# Patient Record
Sex: Male | Born: 1945
Health system: Southern US, Community
[De-identification: ages and names within clinical notes are randomized; demographics above are authoritative.]

## PROBLEM LIST (undated history)

## (undated) DIAGNOSIS — L509 Urticaria, unspecified: Secondary | ICD-10-CM

## (undated) DIAGNOSIS — K602 Anal fissure, unspecified: Secondary | ICD-10-CM

## (undated) DIAGNOSIS — M1990 Unspecified osteoarthritis, unspecified site: Secondary | ICD-10-CM

## (undated) DIAGNOSIS — K5730 Diverticulosis of large intestine without perforation or abscess without bleeding: Secondary | ICD-10-CM

## (undated) DIAGNOSIS — D649 Anemia, unspecified: Secondary | ICD-10-CM

## (undated) DIAGNOSIS — H269 Unspecified cataract: Secondary | ICD-10-CM

## (undated) DIAGNOSIS — R7309 Other abnormal glucose: Secondary | ICD-10-CM

## (undated) DIAGNOSIS — N189 Chronic kidney disease, unspecified: Secondary | ICD-10-CM

## (undated) DIAGNOSIS — I10 Essential (primary) hypertension: Secondary | ICD-10-CM

## (undated) DIAGNOSIS — E785 Hyperlipidemia, unspecified: Secondary | ICD-10-CM

## (undated) DIAGNOSIS — K219 Gastro-esophageal reflux disease without esophagitis: Secondary | ICD-10-CM

## (undated) DIAGNOSIS — Z860100 Personal history of colon polyps, unspecified: Secondary | ICD-10-CM

## (undated) DIAGNOSIS — M109 Gout, unspecified: Secondary | ICD-10-CM

## (undated) DIAGNOSIS — T7840XA Allergy, unspecified, initial encounter: Secondary | ICD-10-CM

## (undated) DIAGNOSIS — B029 Zoster without complications: Secondary | ICD-10-CM

## (undated) DIAGNOSIS — N289 Disorder of kidney and ureter, unspecified: Secondary | ICD-10-CM

## (undated) HISTORY — PX: LIPOMA EXCISION: SHX5283

## (undated) HISTORY — DX: Urticaria, unspecified: L50.9

## (undated) HISTORY — DX: Unspecified cataract: H26.9

## (undated) HISTORY — DX: Gout, unspecified: M10.9

## (undated) HISTORY — PX: TONSILLECTOMY: SHX5217

## (undated) HISTORY — DX: Hyperlipidemia, unspecified: E78.5

## (undated) HISTORY — DX: Allergy, unspecified, initial encounter: T78.40XA

## (undated) HISTORY — DX: Zoster without complications: B02.9

## (undated) HISTORY — PX: HEMORRHOID SURGERY: SHX153

## (undated) HISTORY — DX: Anal fissure, unspecified: K60.2

## (undated) HISTORY — PX: OTHER SURGICAL HISTORY: SHX169

## (undated) HISTORY — DX: Chronic kidney disease, unspecified: N18.9

## (undated) HISTORY — PX: POLYPECTOMY: SHX149

## (undated) HISTORY — DX: Gastro-esophageal reflux disease without esophagitis: K21.9

## (undated) HISTORY — DX: Disorder of kidney and ureter, unspecified: N28.9

## (undated) HISTORY — DX: Diverticulosis of large intestine without perforation or abscess without bleeding: K57.30

## (undated) HISTORY — DX: Unspecified osteoarthritis, unspecified site: M19.90

## (undated) HISTORY — DX: Personal history of colon polyps: Z86.010

## (undated) HISTORY — DX: Personal history of colon polyps, unspecified: Z86.0100

## (undated) HISTORY — PX: CATARACT EXTRACTION: SUR2

## (undated) HISTORY — DX: Essential (primary) hypertension: I10

## (undated) HISTORY — PX: COLONOSCOPY: SHX174

## (undated) HISTORY — DX: Anemia, unspecified: D64.9

## (undated) HISTORY — PX: TONSILLECTOMY: SUR1361

## (undated) HISTORY — DX: Other abnormal glucose: R73.09

---

## 1999-03-14 ENCOUNTER — Other Ambulatory Visit: Admission: RE | Admit: 1999-03-14 | Discharge: 1999-03-14 | Payer: Self-pay | Admitting: Internal Medicine

## 1999-03-14 ENCOUNTER — Encounter (INDEPENDENT_AMBULATORY_CARE_PROVIDER_SITE_OTHER): Payer: Self-pay

## 2004-06-24 ENCOUNTER — Ambulatory Visit: Payer: Self-pay | Admitting: Internal Medicine

## 2004-07-01 ENCOUNTER — Ambulatory Visit: Payer: Self-pay | Admitting: Internal Medicine

## 2004-07-11 ENCOUNTER — Ambulatory Visit: Payer: Self-pay | Admitting: Internal Medicine

## 2004-07-15 ENCOUNTER — Ambulatory Visit: Payer: Self-pay

## 2004-07-20 ENCOUNTER — Ambulatory Visit: Payer: Self-pay | Admitting: Internal Medicine

## 2005-02-09 ENCOUNTER — Ambulatory Visit: Payer: Self-pay | Admitting: Endocrinology

## 2005-02-13 ENCOUNTER — Ambulatory Visit: Payer: Self-pay | Admitting: Endocrinology

## 2005-06-29 ENCOUNTER — Ambulatory Visit: Payer: Self-pay | Admitting: Internal Medicine

## 2005-07-06 ENCOUNTER — Ambulatory Visit: Payer: Self-pay | Admitting: Internal Medicine

## 2005-11-28 ENCOUNTER — Ambulatory Visit: Payer: Self-pay | Admitting: Internal Medicine

## 2006-04-18 ENCOUNTER — Ambulatory Visit: Payer: Self-pay | Admitting: Internal Medicine

## 2006-07-27 ENCOUNTER — Ambulatory Visit: Payer: Self-pay | Admitting: Internal Medicine

## 2006-07-27 LAB — CONVERTED CEMR LAB
AST: 43 units/L — ABNORMAL HIGH (ref 0–37)
Albumin: 4.1 g/dL (ref 3.5–5.2)
Alkaline Phosphatase: 37 units/L — ABNORMAL LOW (ref 39–117)
Basophils Absolute: 0 10*3/uL (ref 0.0–0.1)
Cholesterol: 278 mg/dL (ref 0–200)
Direct LDL: 140.3 mg/dL
Eosinophils Absolute: 0.1 10*3/uL (ref 0.0–0.6)
Eosinophils Relative: 1.8 % (ref 0.0–5.0)
GFR calc Af Amer: 79 mL/min
HDL: 37.9 mg/dL — ABNORMAL LOW (ref >39.0)
Leukocytes, UA: NEGATIVE
MCHC: 34.4 g/dL (ref 30.0–36.0)
MCV: 90.2 fL (ref 78.0–100.0)
Monocytes Absolute: 0.8 10*3/uL — ABNORMAL HIGH (ref 0.2–0.7)
Monocytes Relative: 9.2 % (ref 3.0–11.0)
Neutrophils Relative %: 56.1 % (ref 43.0–77.0)
Nitrite: NEGATIVE
PSA: 0.6 ng/mL (ref 0.10–4.00)
Potassium: 4.6 meq/L (ref 3.5–5.1)
RDW: 12.9 % (ref 11.5–14.6)
Total Bilirubin: 0.5 mg/dL (ref 0.3–1.2)
Total CHOL/HDL Ratio: 7.3
Total Protein, Urine: NEGATIVE mg/dL
Triglycerides: 340 mg/dL (ref 0–149)
VLDL: 68 mg/dL — ABNORMAL HIGH (ref 0–40)

## 2006-08-03 ENCOUNTER — Ambulatory Visit: Payer: Self-pay | Admitting: Internal Medicine

## 2006-10-25 ENCOUNTER — Ambulatory Visit: Payer: Self-pay | Admitting: Internal Medicine

## 2006-10-25 LAB — CONVERTED CEMR LAB
Albumin: 4.1 g/dL (ref 3.5–5.2)
Alkaline Phosphatase: 44 units/L (ref 39–117)
BUN: 15 mg/dL (ref 6–23)
Bilirubin, Direct: 0.2 mg/dL (ref 0.0–0.3)
CO2: 29 meq/L (ref 19–32)
Calcium: 9.6 mg/dL (ref 8.4–10.5)
GFR calc non Af Amer: 60 mL/min
Glucose, Bld: 115 mg/dL — ABNORMAL HIGH (ref 70–99)
LDL Cholesterol: 79 mg/dL (ref 0–99)
Total Bilirubin: 1.2 mg/dL (ref 0.3–1.2)
Total CHOL/HDL Ratio: 4.5

## 2006-11-01 ENCOUNTER — Ambulatory Visit: Payer: Self-pay | Admitting: Internal Medicine

## 2007-03-22 ENCOUNTER — Ambulatory Visit: Payer: Self-pay | Admitting: Internal Medicine

## 2007-03-24 LAB — CONVERTED CEMR LAB
AST: 30 units/L (ref 0–37)
Albumin: 4.2 g/dL (ref 3.5–5.2)
BUN: 23 mg/dL (ref 6–23)
CO2: 29 meq/L (ref 19–32)
Calcium: 9.6 mg/dL (ref 8.4–10.5)
Creatinine, Ser: 1.3 mg/dL (ref 0.4–1.5)
GFR calc non Af Amer: 60 mL/min
Glucose, Bld: 124 mg/dL — ABNORMAL HIGH (ref 70–99)
Hgb A1c MFr Bld: 6.7 % — ABNORMAL HIGH (ref 4.6–6.0)
LDL Cholesterol: 98 mg/dL (ref 0–99)
Potassium: 4.8 meq/L (ref 3.5–5.1)
Sodium: 141 meq/L (ref 135–145)

## 2007-03-29 ENCOUNTER — Ambulatory Visit: Payer: Self-pay | Admitting: Internal Medicine

## 2007-03-29 DIAGNOSIS — J309 Allergic rhinitis, unspecified: Secondary | ICD-10-CM | POA: Insufficient documentation

## 2007-03-29 DIAGNOSIS — E785 Hyperlipidemia, unspecified: Secondary | ICD-10-CM | POA: Insufficient documentation

## 2007-03-29 DIAGNOSIS — M109 Gout, unspecified: Secondary | ICD-10-CM | POA: Insufficient documentation

## 2007-03-29 DIAGNOSIS — I10 Essential (primary) hypertension: Secondary | ICD-10-CM

## 2007-03-29 DIAGNOSIS — Z860100 Personal history of colon polyps, unspecified: Secondary | ICD-10-CM | POA: Insufficient documentation

## 2007-03-29 DIAGNOSIS — R7309 Other abnormal glucose: Secondary | ICD-10-CM | POA: Insufficient documentation

## 2007-03-30 DIAGNOSIS — M1990 Unspecified osteoarthritis, unspecified site: Secondary | ICD-10-CM | POA: Insufficient documentation

## 2007-04-13 ENCOUNTER — Emergency Department (HOSPITAL_COMMUNITY): Admission: EM | Admit: 2007-04-13 | Discharge: 2007-04-13 | Payer: Self-pay | Admitting: Emergency Medicine

## 2007-07-12 ENCOUNTER — Encounter: Payer: Self-pay | Admitting: *Deleted

## 2007-07-12 DIAGNOSIS — K5730 Diverticulosis of large intestine without perforation or abscess without bleeding: Secondary | ICD-10-CM | POA: Insufficient documentation

## 2007-07-12 DIAGNOSIS — Z9089 Acquired absence of other organs: Secondary | ICD-10-CM

## 2007-07-12 DIAGNOSIS — Z872 Personal history of diseases of the skin and subcutaneous tissue: Secondary | ICD-10-CM | POA: Insufficient documentation

## 2007-07-12 DIAGNOSIS — Z9889 Other specified postprocedural states: Secondary | ICD-10-CM | POA: Insufficient documentation

## 2007-07-12 DIAGNOSIS — B029 Zoster without complications: Secondary | ICD-10-CM | POA: Insufficient documentation

## 2007-08-23 ENCOUNTER — Ambulatory Visit: Payer: Self-pay | Admitting: Internal Medicine

## 2007-08-23 LAB — CONVERTED CEMR LAB
ALT: 35 units/L (ref 0–53)
AST: 40 units/L — ABNORMAL HIGH (ref 0–37)
Albumin: 4.4 g/dL (ref 3.5–5.2)
Alkaline Phosphatase: 53 units/L (ref 39–117)
Basophils Absolute: 0.1 10*3/uL (ref 0.0–0.1)
Bilirubin Urine: NEGATIVE
Bilirubin, Direct: 0.2 mg/dL (ref 0.0–0.3)
CO2: 26 meq/L (ref 19–32)
Chloride: 104 meq/L (ref 96–112)
Creatinine, Ser: 1.4 mg/dL (ref 0.4–1.5)
Eosinophils Absolute: 0.1 10*3/uL (ref 0.0–0.7)
Glucose, Bld: 112 mg/dL — ABNORMAL HIGH (ref 70–99)
Hemoglobin: 14.7 g/dL (ref 13.0–17.0)
Leukocytes, UA: NEGATIVE
Lymphocytes Relative: 33.5 % (ref 12.0–46.0)
MCV: 93.1 fL (ref 78.0–100.0)
Neutro Abs: 3.7 10*3/uL (ref 1.4–7.7)
PSA: 0.68 ng/mL (ref 0.10–4.00)
Platelets: 156 10*3/uL (ref 150–400)
Potassium: 4.2 meq/L (ref 3.5–5.1)
Sodium: 139 meq/L (ref 135–145)
TSH: 1.68 microintl units/mL (ref 0.35–5.50)
Total Protein: 7.5 g/dL (ref 6.0–8.3)
Urine Glucose: NEGATIVE mg/dL
Urobilinogen, UA: 0.2 (ref 0.0–1.0)

## 2007-08-29 ENCOUNTER — Ambulatory Visit: Payer: Self-pay | Admitting: Internal Medicine

## 2007-09-10 ENCOUNTER — Telehealth: Payer: Self-pay | Admitting: Internal Medicine

## 2007-12-26 ENCOUNTER — Telehealth: Payer: Self-pay | Admitting: Internal Medicine

## 2008-01-09 ENCOUNTER — Ambulatory Visit: Payer: Self-pay | Admitting: Internal Medicine

## 2008-01-09 DIAGNOSIS — M25579 Pain in unspecified ankle and joints of unspecified foot: Secondary | ICD-10-CM | POA: Insufficient documentation

## 2008-01-09 DIAGNOSIS — M25529 Pain in unspecified elbow: Secondary | ICD-10-CM

## 2008-01-14 ENCOUNTER — Telehealth: Payer: Self-pay | Admitting: Internal Medicine

## 2008-03-26 ENCOUNTER — Ambulatory Visit: Payer: Self-pay | Admitting: Internal Medicine

## 2008-03-26 LAB — CONVERTED CEMR LAB
Albumin: 4.1 g/dL (ref 3.5–5.2)
Alkaline Phosphatase: 42 units/L (ref 39–117)
Calcium: 9.9 mg/dL (ref 8.4–10.5)
Chloride: 103 meq/L (ref 96–112)
Cholesterol: 189 mg/dL (ref 0–200)
GFR calc Af Amer: 66 mL/min
Glucose, Bld: 139 mg/dL — ABNORMAL HIGH (ref 70–99)
HDL: 39 mg/dL (ref >39.0)
TSH: 1.69 microintl units/mL (ref 0.35–5.50)
Total Bilirubin: 1 mg/dL (ref 0.3–1.2)
Total Protein: 7.4 g/dL (ref 6.0–8.3)

## 2008-04-01 ENCOUNTER — Ambulatory Visit: Payer: Self-pay | Admitting: Internal Medicine

## 2008-04-01 DIAGNOSIS — M779 Enthesopathy, unspecified: Secondary | ICD-10-CM | POA: Insufficient documentation

## 2008-04-01 LAB — CONVERTED CEMR LAB: Sed Rate: 18 mm/hr — ABNORMAL HIGH (ref 0–16)

## 2008-04-30 ENCOUNTER — Ambulatory Visit: Payer: Self-pay | Admitting: Internal Medicine

## 2008-08-20 ENCOUNTER — Ambulatory Visit: Payer: Self-pay | Admitting: Internal Medicine

## 2008-09-03 ENCOUNTER — Ambulatory Visit: Payer: Self-pay | Admitting: Internal Medicine

## 2008-09-03 ENCOUNTER — Encounter: Payer: Self-pay | Admitting: Internal Medicine

## 2008-09-05 ENCOUNTER — Encounter: Payer: Self-pay | Admitting: Internal Medicine

## 2008-11-20 ENCOUNTER — Ambulatory Visit: Payer: Self-pay | Admitting: Internal Medicine

## 2008-11-20 LAB — CONVERTED CEMR LAB
AST: 34 units/L (ref 0–37)
Albumin: 4.2 g/dL (ref 3.5–5.2)
Alkaline Phosphatase: 40 units/L (ref 39–117)
BUN: 17 mg/dL (ref 6–23)
Bilirubin, Direct: 0.1 mg/dL (ref 0.0–0.3)
Chloride: 101 meq/L (ref 96–112)
Cholesterol: 201 mg/dL — ABNORMAL HIGH (ref 0–200)
Creatinine, Ser: 1.4 mg/dL (ref 0.4–1.5)
Total CHOL/HDL Ratio: 5

## 2008-11-27 ENCOUNTER — Ambulatory Visit: Payer: Self-pay | Admitting: Internal Medicine

## 2008-11-27 DIAGNOSIS — M7020 Olecranon bursitis, unspecified elbow: Secondary | ICD-10-CM

## 2009-03-03 ENCOUNTER — Telehealth: Payer: Self-pay | Admitting: Internal Medicine

## 2009-03-29 ENCOUNTER — Telehealth: Payer: Self-pay | Admitting: Internal Medicine

## 2009-03-30 ENCOUNTER — Ambulatory Visit: Payer: Self-pay | Admitting: Internal Medicine

## 2009-03-30 DIAGNOSIS — J019 Acute sinusitis, unspecified: Secondary | ICD-10-CM | POA: Insufficient documentation

## 2009-07-15 ENCOUNTER — Ambulatory Visit: Payer: Self-pay | Admitting: Internal Medicine

## 2009-07-16 LAB — CONVERTED CEMR LAB
ALT: 31 units/L (ref 0–53)
Alkaline Phosphatase: 38 units/L — ABNORMAL LOW (ref 39–117)
Basophils Relative: 0.4 % (ref 0.0–3.0)
Bilirubin Urine: NEGATIVE
Bilirubin, Direct: 0.1 mg/dL (ref 0.0–0.3)
CO2: 24 meq/L (ref 19–32)
Calcium: 9.4 mg/dL (ref 8.4–10.5)
Chloride: 105 meq/L (ref 96–112)
Direct LDL: 96.8 mg/dL
Eosinophils Relative: 2.8 % (ref 0.0–5.0)
Glucose, Bld: 186 mg/dL — ABNORMAL HIGH (ref 70–99)
HDL: 38 mg/dL — ABNORMAL LOW (ref >39.00)
Hemoglobin, Urine: NEGATIVE
Lymphocytes Relative: 35.9 % (ref 12.0–46.0)
Monocytes Relative: 8.6 % (ref 3.0–12.0)
Neutrophils Relative %: 52.3 % (ref 43.0–77.0)
Nitrite: NEGATIVE
PSA: 0.71 ng/mL (ref 0.10–4.00)
Platelets: 145 10*3/uL — ABNORMAL LOW (ref 150.0–400.0)
Potassium: 4.5 meq/L (ref 3.5–5.1)
RBC: 4.2 M/uL — ABNORMAL LOW (ref 4.22–5.81)
Sodium: 137 meq/L (ref 135–145)
Total Bilirubin: 0.8 mg/dL (ref 0.3–1.2)
Total CHOL/HDL Ratio: 5
Total Protein, Urine: NEGATIVE mg/dL
Total Protein: 7 g/dL (ref 6.0–8.3)
Urine Glucose: NEGATIVE mg/dL
VLDL: 57.6 mg/dL — ABNORMAL HIGH (ref 0.0–40.0)
WBC: 6.4 10*3/uL (ref 4.5–10.5)
pH: 5.5 (ref 5.0–8.0)

## 2009-07-23 ENCOUNTER — Ambulatory Visit: Payer: Self-pay | Admitting: Internal Medicine

## 2009-07-23 DIAGNOSIS — E119 Type 2 diabetes mellitus without complications: Secondary | ICD-10-CM | POA: Insufficient documentation

## 2009-11-26 ENCOUNTER — Ambulatory Visit: Payer: Self-pay | Admitting: Internal Medicine

## 2009-11-28 LAB — CONVERTED CEMR LAB
CO2: 26 meq/L (ref 19–32)
Calcium: 9.6 mg/dL (ref 8.4–10.5)
GFR calc non Af Amer: 47.49 mL/min (ref >60)
Glucose, Bld: 137 mg/dL — ABNORMAL HIGH (ref 70–99)
HDL: 40.4 mg/dL (ref >39.00)
Potassium: 5.2 meq/L — ABNORMAL HIGH (ref 3.5–5.1)
Sodium: 139 meq/L (ref 135–145)
Testosterone: 332.05 ng/dL — ABNORMAL LOW (ref 350.00–890.00)
Triglycerides: 259 mg/dL — ABNORMAL HIGH (ref 0.0–149.0)
VLDL: 51.8 mg/dL — ABNORMAL HIGH (ref 0.0–40.0)

## 2009-12-02 ENCOUNTER — Ambulatory Visit: Payer: Self-pay | Admitting: Internal Medicine

## 2010-03-10 NOTE — Assessment & Plan Note (Signed)
Summary: f/u   Vital Signs:  Patient profile:   65 year old male Height:      69 inches Weight:      209 pounds BMI:     30.98 Temp:     97.8 degrees F oral Pulse rate:   72 / minute Pulse rhythm:   regular Resp:     16 per minute BP sitting:   140 / 84  (left arm) Cuff size:   large  Vitals Entered By: Lanier Prude, CMA(AAMA) (December 02, 2009 8:40 AM) CC: f/u  Is Patient Diabetic? Yes Comments pt states Metformin has been causing gout flare ups   Primary Care Provider:  Tresa Garter MD  CC:  f/u .  History of Present Illness: The patient presents for a follow up of hypertension, diabetes, hyperlipidemia  He had gout attacks after he started metformin  Current Medications (verified): 1)  Aspirin 325 Mg Tabs (Aspirin) .... Take 1 Tab By Mouth Every Day 2)  Flonase 50 Mcg/act  Susp (Fluticasone Propionate) .Marland Kitchen.. 1-2 Spr Once Daily Prn 3)  Vitamin D3 2000 Unit Caps (Cholecalciferol) .... Once Daily 4)  Glucosamine-Chondroitin 750-600 Mg  Tabs (Glucosamine-Chondroitin) .Marland Kitchen.. 1 By Mouth Bid 5)  Hemril-30 30 Mg Supp (Hydrocortisone Acetate) .Marland Kitchen.. 1 Per Rectum Two Times A Day X 10 Days 6)  Meloxicam 15 Mg Tabs (Meloxicam) .... One By Mouth Daily 7)  Mavik 4 Mg Tabs (Trandolapril) .Marland Kitchen.. 1 By Mouth Qd 8)  Allopurinol 100 Mg Tabs (Allopurinol) .Marland Kitchen.. 1 By Mouth Daily 9)  Pennsaid 1.5 % Soln (Diclofenac Sodium) .... 3-5 Gtt To Skin Three Times Daily 10)  Proctosol Hc 2.5 % Crea (Hydrocortisone) .... Use Two Times A Day Prn 11)  Metformin Hcl 500 Mg Tabs (Metformin Hcl) .Marland Kitchen.. 1 By Mouth Two Times A Day For Sugar  Allergies (verified): 1)  ! Sulfa 2)  Lipitor 3)  Metformin Hcl  Past History:  Past Medical History: Last updated: 07/23/2009 Hx of HERPES ZOSTER (ICD-053.9) ANAL FISSURE, HX OF (ICD-V13.3) Hx of DIVERTICULOSIS, COLON (ICD-562.10) OSTEOARTHRITIS (ICD-715.90) ABNORMAL GLUCOSE NEC (ICD-790.29) ALLERGIC RHINITIS (ICD-477.9) HYPERTENSION  (ICD-401.9) HYPERLIPIDEMIA (ICD-272.4) GOUT (ICD-274.9) COLONIC POLYPS, HX OF (ICD-V12.72) ESSENTIAL HYPERTENSION, BENIGN (ICD-401.1)  Diabetes mellitus, type II 2011  Social History: Last updated: 03/30/2009 Married Never Smoked Alcohol use-no Drug use-no Regular exercise-no  Review of Systems  The patient denies weight loss, chest pain, and abdominal pain.    Physical Exam  General:  alert, well-developed, well-nourished, well-hydrated, appropriate dress, normal appearance, healthy-appearing, cooperative to examination, and good hygiene.   Ears:  R ear normal and L ear normal.   Mouth:  Oral mucosa and oropharynx without lesions or exudates.  Teeth in good repair. Neck:  supple, full ROM, no masses, no thyromegaly, no JVD, normal carotid upstroke, no carotid bruits, no cervical lymphadenopathy, and no neck tenderness.   Lungs:  Normal respiratory effort, chest expands symmetrically. Lungs are clear to auscultation, no crackles or wheezes. Heart:  Normal rate and regular rhythm. S1 and S2 normal without gallop, murmur, click, rub or other extra sounds. Abdomen:  Bowel sounds positive,abdomen soft and non-tender without masses, organomegaly or hernias noted. Msk:  No deformity or scoliosis noted of thoracic or lumbar spine.   Extremities:  No clubbing, cyanosis, edema, or deformity noted with normal full range of motion of all joints.   Neurologic:  No cranial nerve deficits noted. Station and gait are normal. Plantar reflexes are down-going bilaterally. DTRs are symmetrical throughout. Sensory, motor and coordinative  functions appear intact. Skin:  Intact without suspicious lesions or rashes Psych:  Cognition and judgment appear intact. Alert and cooperative with normal attention span and concentration. No apparent delusions, illusions, hallucinations   Impression & Recommendations:  Problem # 1:  DIABETES MELLITUS, TYPE II (ICD-250.00) Assessment Improved  The following  medications were removed from the medication list:    Metformin Hcl 500 Mg Tabs (Metformin hcl) .Marland Kitchen... 1 by mouth two times a day for sugar His updated medication list for this problem includes:    Aspirin 325 Mg Tabs (Aspirin) .Marland Kitchen... Take 1 tab by mouth every day    Mavik 4 Mg Tabs (Trandolapril) .Marland Kitchen... 1 by mouth qd    Kombiglyze Xr 06-998 Mg Xr24h-tab (Saxagliptin-metformin) .Marland Kitchen... 1 by mouth qd  Labs Reviewed: Creat: 1.6 (11/26/2009)    Reviewed HgBA1c results: 7.8 (11/26/2009)  8.8 (07/15/2009)  Problem # 2:  PAIN IN JOINT, ANKLE AND FOOT (ICD-719.47) Assessment: Deteriorated Poss gout  Problem # 3:  HYPERTENSION (ICD-401.9) Assessment: Unchanged  His updated medication list for this problem includes:    Mavik 4 Mg Tabs (Trandolapril) .Marland Kitchen... 1 by mouth qd  Problem # 4:  ANAL FISSURE, HX OF (ICD-V13.3) Assessment: Improved  Problem # 5:  HYPERLIPIDEMIA (ICD-272.4) Assessment: Comment Only  His updated medication list for this problem includes:    Crestor 10 Mg Tabs (Rosuvastatin calcium) .Marland Kitchen... 1 by mouth once daily for cholesterol  Labs Reviewed: SGOT: 28 (07/15/2009)   SGPT: 31 (07/15/2009)   HDL:40.40 (11/26/2009), 38.00 (07/15/2009)  LDL:104 (03/26/2008), 83 (14/78/2956)  Chol:195 (11/26/2009), 193 (07/15/2009)  Trig:259.0 (11/26/2009), 288.0 (07/15/2009)  Complete Medication List: 1)  Aspirin 325 Mg Tabs (Aspirin) .... Take 1 tab by mouth every day 2)  Flonase 50 Mcg/act Susp (Fluticasone propionate) .Marland Kitchen.. 1-2 spr once daily prn 3)  Vitamin D3 2000 Unit Caps (Cholecalciferol) .... Once daily 4)  Glucosamine-chondroitin 750-600 Mg Tabs (Glucosamine-chondroitin) .Marland Kitchen.. 1 by mouth bid 5)  Hemril-30 30 Mg Supp (Hydrocortisone acetate) .Marland Kitchen.. 1 per rectum two times a day x 10 days 6)  Meloxicam 15 Mg Tabs (Meloxicam) .... One by mouth daily 7)  Mavik 4 Mg Tabs (Trandolapril) .Marland Kitchen.. 1 by mouth qd 8)  Allopurinol 100 Mg Tabs (Allopurinol) .Marland Kitchen.. 1 by mouth daily 9)  Pennsaid 1.5 % Soln  (Diclofenac sodium) .... 3-5 gtt to skin three times daily 10)  Proctosol Hc 2.5 % Crea (Hydrocortisone) .... Use two times a day prn 11)  Kombiglyze Xr 06-998 Mg Xr24h-tab (Saxagliptin-metformin) .Marland Kitchen.. 1 by mouth qd 12)  Crestor 10 Mg Tabs (Rosuvastatin calcium) .Marland Kitchen.. 1 by mouth once daily for cholesterol  Patient Instructions: 1)  Please schedule a follow-up appointment in 4 months. 2)  BMP prior to visit, ICD-9: 3)  HbgA1C prior to visit, ICD-9: 4)  Urine Microalbumin prior to visit, ICD-9: 5)  uric acid  250.00 995.20 Prescriptions: CRESTOR 10 MG TABS (ROSUVASTATIN CALCIUM) 1 by mouth once daily for cholesterol  #90 x 3   Entered and Authorized by:   Tresa Garter MD   Signed by:   Tresa Garter MD on 12/02/2009   Method used:   Print then Give to Patient   RxID:   [id number] MELOXICAM 15 MG TABS (MELOXICAM) one by mouth daily  #90 x 3   Entered and Authorized by:   Tresa Garter MD   Signed by:   Tresa Garter MD on 12/02/2009   Method used:   Print then Give to Patient   RxID:  [id number] MAVIK 4 MG TABS (TRANDOLAPRIL) 1 by mouth qd  #90 x 3   Entered and Authorized by:   Tresa Garter MD   Signed by:   Tresa Garter MD on 12/02/2009   Method used:   Print then Give to Patient   RxID:   [id number] ALLOPURINOL 100 MG TABS (ALLOPURINOL) 1 by mouth daily  #90 x 3   Entered and Authorized by:   Tresa Garter MD   Signed by:   Tresa Garter MD on 12/02/2009   Method used:   Print then Give to Patient   RxID:   [id number] KOMBIGLYZE XR 06-998 MG XR24H-TAB (SAXAGLIPTIN-METFORMIN) 1 by mouth qd  #90 x 3   Entered and Authorized by:   Tresa Garter MD   Signed by:   Tresa Garter MD on 12/02/2009   Method used:   Print then Give to Patient   RxID:   [id number] KOMBIGLYZE XR 06-998 MG XR24H-TAB (SAXAGLIPTIN-METFORMIN) 1 by mouth qd  #30 x 11   Entered and Authorized by:   Tresa Garter  MD   Signed by:   Tresa Garter MD on 12/02/2009   Method used:   Print then Give to Patient   RxID:   [id number]    Orders Added: 1)  Est. Patient Level IV [51884]

## 2010-03-10 NOTE — Progress Notes (Signed)
Summary: Crestor correction  Phone Note Call from Patient Call back at Home Phone [phone]   Summary of Call: Patient left message on triage stating the he needs his Crestor prescription corrected. Per the patient he needs it for 10mg  90 day suppy with year of refills. ?Ok to correct? Initial call taken by: Lucious Groves,  March 03, 2009 10:36 AM  Follow-up for Phone Call        OK to correct Follow-up by: Tresa Garter MD,  March 03, 2009 1:14 PM  Additional Follow-up for Phone Call Additional follow up Details #1::        sent. Patient notified and per the patient it should go to [facility]--corrected. Additional Follow-up by: Lucious Groves,  March 03, 2009 3:16 PM    New/Updated Medications: CRESTOR 10 MG TABS (ROSUVASTATIN CALCIUM) 1 by mouth qd Prescriptions: CRESTOR 10 MG TABS (ROSUVASTATIN CALCIUM) 1 by mouth qd  #90 x 3   Entered by:   Lucious Groves   Authorized by:   Tresa Garter MD   Signed by:   Lucious Groves on 03/03/2009   Method used:   Faxed to ...       [facility] [address] (mail-order)       [address] [place], [place]  [address]       Ph: [phone]       Fax: [phone]   RxID:   [id number] CRESTOR 10 MG TABS (ROSUVASTATIN CALCIUM) 1 by mouth qd  #90 x 3   Entered by:   Lucious Groves   Authorized by:   Tresa Garter MD   Signed by:   Lucious Groves on 03/03/2009   Method used:   Electronically to        [facility]* (mail-order)             ,          Ph: [phone]       Fax: [phone]   RxID:   [id number]

## 2010-03-10 NOTE — Progress Notes (Signed)
Summary: REQ FOR RX  Phone Note Call from Patient Call back at [phone]   Summary of Call: Pt c/o "cold" with dry cough. He is req rx for tussionex.  Initial call taken by: Lamar Sprinkles, CMA,  March 29, 2009 2:29 PM  Follow-up for Phone Call        ok 100 ml OV if sick Follow-up by: Tresa Garter MD,  March 30, 2009 7:43 AM  Additional Follow-up for Phone Call Additional follow up Details #1::        How many cc/times per day? Additional Follow-up by: Lucious Groves,  March 30, 2009 8:49 AM    Additional Follow-up for Phone Call Additional follow up Details #2::    patient being seen in office for appt Follow-up by: Rock Nephew CMA,  March 30, 2009 10:08 AM

## 2010-03-10 NOTE — Assessment & Plan Note (Signed)
Summary: CPX STC #   RS'D PER PT/NWS   Vital Signs:  Patient profile:   65 year old male Height:      69 inches (175.26 cm) Weight:      214 pounds (97.27 kg) BMI:     31.72 O2 Sat:      97 % on Room air Temp:     97.7 degrees F (36.50 degrees C) oral Pulse rate:   83 / minute BP sitting:   130 / 86  (left arm) Cuff size:   large  Vitals Entered By: Lucious Groves (July 23, 2009 8:36 AM)  O2 Flow:  Room air CC: CPX./kb Is Patient Diabetic? No Pain Assessment Patient in pain? no        Primary Care Provider:  Tresa Garter MD  CC:  CPX./kb.  History of Present Illness: The patient presents for a wellness examination   Current Medications (verified): 1)  Aspirin 325 Mg Tabs (Aspirin) .... Take 1 Tab By Mouth Every Day 2)  Flonase 50 Mcg/act  Susp (Fluticasone Propionate) .Marland Kitchen.. 1-2 Spr Once Daily Prn 3)  Vitamin D3 2000 Unit Caps (Cholecalciferol) .... Once Daily 4)  Glucosamine-Chondroitin 750-600 Mg  Tabs (Glucosamine-Chondroitin) .Marland Kitchen.. 1 By Mouth Bid 5)  Hemril-30 30 Mg Supp (Hydrocortisone Acetate) .Marland Kitchen.. 1 Per Rectum Two Times A Day X 10 Days 6)  Meloxicam 15 Mg Tabs (Meloxicam) .... One By Mouth Daily 7)  Mavik 4 Mg Tabs (Trandolapril) .Marland Kitchen.. 1 By Mouth Qd 8)  Allopurinol 100 Mg Tabs (Allopurinol) .Marland Kitchen.. 1 By Mouth Daily 9)  Pennsaid 1.5 % Soln (Diclofenac Sodium) .... 3-5 Gtt To Skin Three Times Daily 10)  Proctosol Hc 2.5 % Crea (Hydrocortisone) .... Use Two Times A Day Prn 11)  Tussionex Pennkinetic Er 8-10 Mg/45ml Lqcr (Chlorpheniramine-Hydrocodone) .... 5 Ml By Mouth Two Times A Day As Needed For Cough  Allergies (verified): 1)  ! Sulfa 2)  Lipitor  Past History:  Past Surgical History: Last updated: 07/12/2007 * Hx of LIPOMA REMOVED FROM THE NECK. TONSILLECTOMY, HX OF (ICD-V45.79) HEMORRHOIDECTOMY, HX OF (ICD-V45.89)  Family History: Last updated: 03/29/2007 Family History Hypertension  Social History: Last updated: 03/30/2009 Married Never  Smoked Alcohol use-no Drug use-no Regular exercise-no  Past Medical History: Hx of HERPES ZOSTER (ICD-053.9) ANAL FISSURE, HX OF (ICD-V13.3) Hx of DIVERTICULOSIS, COLON (ICD-562.10) OSTEOARTHRITIS (ICD-715.90) ABNORMAL GLUCOSE NEC (ICD-790.29) ALLERGIC RHINITIS (ICD-477.9) HYPERTENSION (ICD-401.9) HYPERLIPIDEMIA (ICD-272.4) GOUT (ICD-274.9) COLONIC POLYPS, HX OF (ICD-V12.72) ESSENTIAL HYPERTENSION, BENIGN (ICD-401.1)  Diabetes mellitus, type II 2011  Review of Systems  The patient denies anorexia, fever, weight loss, weight gain, vision loss, decreased hearing, hoarseness, chest pain, syncope, dyspnea on exertion, peripheral edema, prolonged cough, headaches, hemoptysis, abdominal pain, melena, hematochezia, severe indigestion/heartburn, hematuria, incontinence, genital sores, muscle weakness, suspicious skin lesions, transient blindness, difficulty walking, depression, unusual weight change, abnormal bleeding, enlarged lymph nodes, angioedema, and testicular masses.    Physical Exam  General:  alert, well-developed, well-nourished, well-hydrated, appropriate dress, normal appearance, healthy-appearing, cooperative to examination, and good hygiene.   Head:  normocephalic, atraumatic, no abnormalities observed, and no abnormalities palpated.   Eyes:  vision grossly intact, pupils equal, pupils round, and pupils reactive to light.   Ears:  R ear normal and L ear normal.   Nose:  nasal discharge, mucosal erythema, and mucosal edema.  no airflow obstruction, no intranasal foreign body, no nasal polyps, no nasal mucosal lesions, no mucosal friability, no active bleeding or clots, no septum abnormalities, nasal dischargemucosal pallor, mucosal erythema, mucosal  edema, L maxillary sinus tenderness, and R maxillary sinus tenderness.   Mouth:  Oral mucosa and oropharynx without lesions or exudates.  Teeth in good repair. Neck:  supple, full ROM, no masses, no thyromegaly, no JVD, normal carotid  upstroke, no carotid bruits, no cervical lymphadenopathy, and no neck tenderness.   Lungs:  Normal respiratory effort, chest expands symmetrically. Lungs are clear to auscultation, no crackles or wheezes. Heart:  Normal rate and regular rhythm. S1 and S2 normal without gallop, murmur, click, rub or other extra sounds. Abdomen:  Bowel sounds positive,abdomen soft and non-tender without masses, organomegaly or hernias noted. Msk:  No deformity or scoliosis noted of thoracic or lumbar spine.   Pulses:  R and L carotid,radial,femoral,dorsalis pedis and posterior tibial pulses are full and equal bilaterally Extremities:  No clubbing, cyanosis, edema, or deformity noted with normal full range of motion of all joints.   Neurologic:  No cranial nerve deficits noted. Station and gait are normal. Plantar reflexes are down-going bilaterally. DTRs are symmetrical throughout. Sensory, motor and coordinative functions appear intact. Skin:  Intact without suspicious lesions or rashes Cervical Nodes:  no anterior cervical adenopathy and no posterior cervical adenopathy.   Psych:  Cognition and judgment appear intact. Alert and cooperative with normal attention span and concentration. No apparent delusions, illusions, hallucinations   Impression & Recommendations:  Problem # 1:  PHYSICAL EXAMINATION (ICD-V70.0) Assessment New Health and age related issues were discussed. Available screening tests and vaccinations were discussed as well. Healthy life style including good diet and execise was discussed.  The labs were reviewed with the patient.  Orders: EKG w/ Interpretation (93000) Tdap => 15yrs IM ([address]) Admin 1st Vaccine (60454) Admin 1st Vaccine (State) [phone])  Problem # 2:  ABNORMAL GLUCOSE NEC (ICD-790.29) Assessment: Comment Only  His updated medication list for this problem includes:    Metformin Hcl 500 Mg Tabs (Metformin hcl) .Marland Kitchen... 1 by mouth two times a day for sugar  Labs Reviewed: Creat:  1.5 (07/15/2009)     Problem # 3:  HYPERLIPIDEMIA (ICD-272.4) Assessment: Comment Only  Labs Reviewed: SGOT: 28 (07/15/2009)   SGPT: 31 (07/15/2009)   HDL:38.00 (07/15/2009), 40.10 (11/20/2008)  LDL:104 (03/26/2008), 83 (14/78/2956)  Chol:193 (07/15/2009), 201 (11/20/2008)  Trig:288.0 (07/15/2009), 189.0 (11/20/2008)  Problem # 4:  HYPERTENSION (ICD-401.9) Assessment: Unchanged  His updated medication list for this problem includes:    Mavik 4 Mg Tabs (Trandolapril) .Marland Kitchen... 1 by mouth qd  Complete Medication List: 1)  Aspirin 325 Mg Tabs (Aspirin) .... Take 1 tab by mouth every day 2)  Flonase 50 Mcg/act Susp (Fluticasone propionate) .Marland Kitchen.. 1-2 spr once daily prn 3)  Vitamin D3 2000 Unit Caps (Cholecalciferol) .... Once daily 4)  Glucosamine-chondroitin 750-600 Mg Tabs (Glucosamine-chondroitin) .Marland Kitchen.. 1 by mouth bid 5)  Hemril-30 30 Mg Supp (Hydrocortisone acetate) .Marland Kitchen.. 1 per rectum two times a day x 10 days 6)  Meloxicam 15 Mg Tabs (Meloxicam) .... One by mouth daily 7)  Mavik 4 Mg Tabs (Trandolapril) .Marland Kitchen.. 1 by mouth qd 8)  Allopurinol 100 Mg Tabs (Allopurinol) .Marland Kitchen.. 1 by mouth daily 9)  Pennsaid 1.5 % Soln (Diclofenac sodium) .... 3-5 gtt to skin three times daily 10)  Proctosol Hc 2.5 % Crea (Hydrocortisone) .... Use two times a day prn 11)  Metformin Hcl 500 Mg Tabs (Metformin hcl) .Marland Kitchen.. 1 by mouth two times a day for sugar  Patient Instructions: 1)  Please schedule a follow-up appointment in 3 months. 2)  BMP prior to visit, ICD-9:  3)  HbgA1C prior to visit, ICD-9: 4)  Lipid Panel prior to visit, ICD-9: 5)  testost  250.00   995.20 6)  Try to eat more raw plant food, fresh and dry fruit, raw almonds, leafy vegetables, whole foods and less red meat, less animal fat. Poultry and fish is better for you than pork and beef. Avoid processed foods (canned soups, hot dogs, sausage, bacon , frozen dinners). Avoid corn syrup, high fructose syrup or aspartam  drinks. Honey, Agave and Stevia are  better sweeteners. Make your own  dressing with olive oil, wine vinegar, lemon juce, garlic etc. for your salads. Prescriptions: METFORMIN HCL 500 MG TABS (METFORMIN HCL) 1 by mouth two times a day for sugar  #180 x 3   Entered and Authorized by:   Tresa Garter MD   Signed by:   Tresa Garter MD on 07/23/2009   Method used:   Print then Give to Patient   RxID:   [id number] METFORMIN HCL 500 MG TABS (METFORMIN HCL) 1 by mouth two times a day for sugar  #60 x 12   Entered and Authorized by:   Tresa Garter MD   Signed by:   Tresa Garter MD on 07/23/2009   Method used:   [facility] to        [phone] ([facility]ail)       [address]       [place], [place]  [address]       Ph: [phone] or [phone]       Fax: [phone]   RxID:   [id number]    Tetanus/Td Vaccine    Vaccine Type: Tdap    Site: left deltoid    Mfr: GlaxoSmithK[employer]    Dose: 0.5 ml    Route: IM    Given by: Lucious Groves    Exp. Date: 04/30/2011    Lot #: [id number]    VIS given: 12/25/06 version given July 23, 2009.

## 2010-03-10 NOTE — Assessment & Plan Note (Signed)
Summary: sinus problem-cough-congestion-dr avp pt/no slot--stc   Vital Signs:  Patient profile:   65 year old male Height:      69 inches Weight:      223 pounds BMI:     33.05 O2 Sat:      97 % on Room air Temp:     97.0 degrees F oral Pulse rate:   84 / minute Pulse rhythm:   regular Resp:     16 per minute BP sitting:   144 / 82  (left arm) Cuff size:   large  Vitals Entered By: Rock Nephew CMA (March 30, 2009 10:06 AM)  Nutrition Counseling: Patient's BMI is greater than 25 and therefore counseled on weight management options.  O2 Flow:  Room air CC: cough w/ green mucus x 10days, sore thoat and bronchitis, URI symptoms   Primary Care Provider:  Georgina Quint Plotnikov MD  CC:  cough w/ green mucus x 10days, sore thoat and bronchitis, and URI symptoms.  History of Present Illness:  URI Symptoms      This is a 65 year old man who presents with URI symptoms.  The symptoms began 2 weeks ago.  The severity is described as moderate.  The patient reports nasal congestion, purulent nasal discharge, sore throat, productive cough, and sick contacts, but denies earache.  Associated symptoms include low-grade fever (<100.5 degrees).  The patient denies stiff neck, dyspnea, wheezing, rash, vomiting, diarrhea, use of an antipyretic, and response to antipyretic.  The patient denies headache, muscle aches, and severe fatigue.  Risk factors for Strep sinusitis include unilateral facial pain, unilateral nasal discharge, and double sickening.  The patient denies the following risk factors for Strep sinusitis: Strep exposure, tender adenopathy, and absence of cough.    Preventive Screening-Counseling & Management  Alcohol-Tobacco     Alcohol drinks/day: 0     Smoking Status: never  Caffeine-Diet-Exercise     Does Patient Exercise: no  Hep-HIV-STD-Contraception     Hepatitis Risk: no risk noted     HIV Risk: no risk noted     STD Risk: no risk noted      Sexual History:  currently  monogamous.        Drug Use:  no.        Blood Transfusions:  no.    Medications Prior to Update: 1)  Aspirin 325 Mg Tabs (Aspirin) .... Take 1 Tab By Mouth Every Day 2)  Flonase 50 Mcg/act  Susp (Fluticasone Propionate) .Marland Kitchen.. 1-2 Spr Once Daily Prn 3)  Vitamin D3 2000 Unit Caps (Cholecalciferol) .... Once Daily 4)  Glucosamine-Chondroitin 750-600 Mg  Tabs (Glucosamine-Chondroitin) .Marland Kitchen.. 1 By Mouth Bid 5)  Hemril-30 30 Mg Supp (Hydrocortisone Acetate) .Marland Kitchen.. 1 Per Rectum Two Times A Day X 10 Days 6)  Meloxicam 15 Mg Tabs (Meloxicam) .... One By Mouth Daily 7)  Mavik 4 Mg Tabs (Trandolapril) .Marland Kitchen.. 1 By Mouth Qd 8)  Allopurinol 100 Mg Tabs (Allopurinol) .Marland Kitchen.. 1 By Mouth Daily 9)  Pennsaid 1.5 % Soln (Diclofenac Sodium) .... 3-5 Gtt To Skin Three Times Daily 10)  Proctosol Hc 2.5 % Crea (Hydrocortisone) .... Use Two Times A Day Prn 11)  Crestor 10 Mg Tabs (Rosuvastatin Calcium) .Marland Kitchen.. 1 By Mouth Qd  Current Medications (verified): 1)  Aspirin 325 Mg Tabs (Aspirin) .... Take 1 Tab By Mouth Every Day 2)  Flonase 50 Mcg/act  Susp (Fluticasone Propionate) .Marland Kitchen.. 1-2 Spr Once Daily Prn 3)  Vitamin D3 2000 Unit Caps (Cholecalciferol) .... Once Daily  4)  Glucosamine-Chondroitin 750-600 Mg  Tabs (Glucosamine-Chondroitin) .Marland Kitchen.. 1 By Mouth Bid 5)  Hemril-30 30 Mg Supp (Hydrocortisone Acetate) .Marland Kitchen.. 1 Per Rectum Two Times A Day X 10 Days 6)  Meloxicam 15 Mg Tabs (Meloxicam) .... One By Mouth Daily 7)  Mavik 4 Mg Tabs (Trandolapril) .Marland Kitchen.. 1 By Mouth Qd 8)  Allopurinol 100 Mg Tabs (Allopurinol) .Marland Kitchen.. 1 By Mouth Daily 9)  Pennsaid 1.5 % Soln (Diclofenac Sodium) .... 3-5 Gtt To Skin Three Times Daily 10)  Proctosol Hc 2.5 % Crea (Hydrocortisone) .... Use Two Times A Day Prn 11)  Crestor 10 Mg Tabs (Rosuvastatin Calcium) .Marland Kitchen.. 1 By Mouth Qd 12)  Amoxicillin 500 Mg Cap (Amoxicillin) .... Take 1 Capsule By Mouth Three Times A Day X 10 Days 13)  Tussionex Pennkinetic Er 8-10 Mg/37ml Lqcr (Chlorpheniramine-Hydrocodone)  .... 5 Ml By Mouth Two Times A Day As Needed For Cough  Allergies (verified): 1)  ! Sulfa 2)  Lipitor  Past History:  Past Medical History: Reviewed history from 07/12/2007 and no changes required. Hx of HERPES ZOSTER (ICD-053.9) ANAL FISSURE, HX OF (ICD-V13.3) Hx of DIVERTICULOSIS, COLON (ICD-562.10) OSTEOARTHRITIS (ICD-715.90) ABNORMAL GLUCOSE NEC (ICD-790.29) ALLERGIC RHINITIS (ICD-477.9) HYPERTENSION (ICD-401.9) HYPERLIPIDEMIA (ICD-272.4) GOUT (ICD-274.9) COLONIC POLYPS, HX OF (ICD-V12.72) ESSENTIAL HYPERTENSION, BENIGN (ICD-401.1)    Past Surgical History: Reviewed history from 07/12/2007 and no changes required. * Hx of LIPOMA REMOVED FROM THE NECK. TONSILLECTOMY, HX OF (ICD-V45.79) HEMORRHOIDECTOMY, HX OF (ICD-V45.89)  Family History: Reviewed history from 03/29/2007 and no changes required. Family History Hypertension  Social History: Reviewed history from 03/29/2007 and no changes required. Married Never Smoked Alcohol use-no Drug use-no Regular exercise-no Hepatitis Risk:  no risk noted HIV Risk:  no risk noted STD Risk:  no risk noted Sexual History:  currently monogamous Blood Transfusions:  no Drug Use:  no Does Patient Exercise:  no  Review of Systems       The patient complains of weight gain.  The patient denies anorexia, weight loss, chest pain, syncope, dyspnea on exertion, peripheral edema, prolonged cough, headaches, hemoptysis, abdominal pain, hematuria, suspicious skin lesions, enlarged lymph nodes, and angioedema.    Physical Exam  General:  alert, well-developed, well-nourished, well-hydrated, appropriate dress, normal appearance, healthy-appearing, cooperative to examination, and good hygiene.   Head:  normocephalic, atraumatic, no abnormalities observed, and no abnormalities palpated.   Eyes:  vision grossly intact, pupils equal, pupils round, and pupils reactive to light.   Ears:  R ear normal and L ear normal.   Nose:  nasal  discharge, mucosal erythema, and mucosal edema.  no airflow obstruction, no intranasal foreign body, no nasal polyps, no nasal mucosal lesions, no mucosal friability, no active bleeding or clots, no septum abnormalities, nasal dischargemucosal pallor, mucosal erythema, mucosal edema, L maxillary sinus tenderness, and R maxillary sinus tenderness.   Mouth:  Oral mucosa and oropharynx without lesions or exudates.  Teeth in good repair. Neck:  supple, full ROM, no masses, no thyromegaly, no JVD, normal carotid upstroke, no carotid bruits, no cervical lymphadenopathy, and no neck tenderness.   Lungs:  Normal respiratory effort, chest expands symmetrically. Lungs are clear to auscultation, no crackles or wheezes. Heart:  Normal rate and regular rhythm. S1 and S2 normal without gallop, murmur, click, rub or other extra sounds. Abdomen:  Bowel sounds positive,abdomen soft and non-tender without masses, organomegaly or hernias noted. Msk:  No deformity or scoliosis noted of thoracic or lumbar spine.   Pulses:  R and L carotid,radial,femoral,dorsalis pedis and posterior tibial  pulses are full and equal bilaterally Extremities:  No clubbing, cyanosis, edema, or deformity noted with normal full range of motion of all joints.   Neurologic:  No cranial nerve deficits noted. Station and gait are normal. Plantar reflexes are down-going bilaterally. DTRs are symmetrical throughout. Sensory, motor and coordinative functions appear intact. Skin:  Intact without suspicious lesions or rashes Cervical Nodes:  no anterior cervical adenopathy and no posterior cervical adenopathy.   Axillary Nodes:  no R axillary adenopathy and no L axillary adenopathy.   Inguinal Nodes:  no R inguinal adenopathy and no L inguinal adenopathy.   Psych:  Cognition and judgment appear intact. Alert and cooperative with normal attention span and concentration. No apparent delusions, illusions, hallucinations   Impression &  Recommendations:  Problem # 1:  SINUSITIS- ACUTE-NOS (ICD-461.9) Assessment New  His updated medication list for this problem includes:    Flonase 50 Mcg/act Susp (Fluticasone propionate) .Marland Kitchen... 1-2 spr once daily prn    Amoxicillin 500 Mg Cap (Amoxicillin) .Marland Kitchen... Take 1 capsule by mouth three times a day x 10 days    Tussionex Pennkinetic Er 8-10 Mg/23ml Lqcr (Chlorpheniramine-hydrocodone) .Marland KitchenMarland KitchenMarland KitchenMarland Kitchen 5 ml by mouth two times a day as needed for cough  Problem # 2:  COUGH (ICD-786.2) Assessment: New  will look for pna, edema, mass, etc.  Orders: T-2 View CXR (71020TC)  Problem # 3:  HYPERTENSION (ICD-401.9) Assessment: Improved If the cough persists then he'll have to stop the [facility]I [facility] ) His updated medication list for this problem includes:    Mavik 4 Mg Tabs (Trandolapril) .Marland Kitchen... 1 by mouth qd  BP today: 144/82 Prior BP: 172/94 (11/27/2008)  Labs Reviewed: K+: 4.0 (11/20/2008) Creat: : 1.4 (11/20/2008)   Chol: 201 (11/20/2008)   HDL: 40.10 (11/20/2008)   LDL: 104 (03/26/2008)   TG: 189.0 (11/20/2008)  Complete Medication List: 1)  Aspirin 325 Mg Tabs (Aspirin) .... Take 1 tab by mouth every day 2)  Flonase 50 Mcg/act Susp (Fluticasone propionate) .Marland Kitchen.. 1-2 spr once daily prn 3)  Vitamin D3 2000 Unit Caps (Cholecalciferol) .... Once daily 4)  Glucosamine-chondroitin 750-600 Mg Tabs (Glucosamine-chondroitin) .Marland Kitchen.. 1 by mouth bid 5)  Hemril-30 30 Mg Supp (Hydrocortisone acetate) .Marland Kitchen.. 1 per rectum two times a day x 10 days 6)  Meloxicam 15 Mg Tabs (Meloxicam) .... One by mouth daily 7)  Mavik 4 Mg Tabs (Trandolapril) .Marland Kitchen.. 1 by mouth qd 8)  Allopurinol 100 Mg Tabs (Allopurinol) .Marland Kitchen.. 1 by mouth daily 9)  Pennsaid 1.5 % Soln (Diclofenac sodium) .... 3-5 gtt to skin three times daily 10)  Proctosol Hc 2.5 % Crea (Hydrocortisone) .... Use two times a day prn 11)  Crestor 10 Mg Tabs (Rosuvastatin calcium) .Marland Kitchen.. 1 by mouth qd 12)  Amoxicillin 500 Mg Cap (Amoxicillin) .... Take 1 capsule by  mouth three times a day x 10 days 13)  Tussionex Pennkinetic Er 8-10 Mg/65ml Lqcr (Chlorpheniramine-hydrocodone) .... 5 ml by mouth two times a day as needed for cough  Patient Instructions: 1)  Please schedule a follow-up appointment in 1 month. 2)  Check your Blood Pressure regularly. If it is above 140/90: you should make an appointment. 3)  Take your antibiotic as prescribed until ALL of it is gone, but stop if you develop a rash or swelling and contact our office as soon as possible. 4)  Acute sinusitis symptoms for less than 10 days are not helped by antibiotics.Use warm moist compresses, and over the counter decongestants ( only as directed). Call  if no improvement in 5-7 days, sooner if increasing pain, fever, or new symptoms. Prescriptions: TUSSIONEX PENNKINETIC ER 8-10 MG/5ML LQCR (CHLORPHENIRAMINE-HYDROCODONE) 5 ml by mouth two times a day as needed for cough  #120 ml x 0   Entered and Authorized by:   Etta Grandchild MD   Signed by:   Etta Grandchild MD on 03/30/2009   Method used:   Print then Give to Patient   RxID:   [id number] AMOXICILLIN 500 MG CAP (AMOXICILLIN) Take 1 capsule by mouth three times a day X 10 days  #30 x 1   Entered and Authorized by:   Etta Grandchild MD   Signed by:   Etta Grandchild MD on 03/30/2009   Method used:   Print then Give to Patient   RxID:   [id number]

## 2010-03-31 ENCOUNTER — Other Ambulatory Visit: Payer: Self-pay | Admitting: Internal Medicine

## 2010-03-31 ENCOUNTER — Encounter (INDEPENDENT_AMBULATORY_CARE_PROVIDER_SITE_OTHER): Payer: Self-pay | Admitting: *Deleted

## 2010-03-31 ENCOUNTER — Other Ambulatory Visit: Payer: Managed Care, Other (non HMO)

## 2010-03-31 DIAGNOSIS — T887XXA Unspecified adverse effect of drug or medicament, initial encounter: Secondary | ICD-10-CM

## 2010-03-31 DIAGNOSIS — E119 Type 2 diabetes mellitus without complications: Secondary | ICD-10-CM

## 2010-03-31 LAB — BASIC METABOLIC PANEL
CO2: 26 mEq/L (ref 19–32)
Chloride: 105 mEq/L (ref 96–112)
Glucose, Bld: 118 mg/dL — ABNORMAL HIGH (ref 70–99)
Potassium: 4.3 mEq/L (ref 3.5–5.1)
Sodium: 139 mEq/L (ref 135–145)

## 2010-03-31 LAB — URIC ACID: Uric Acid, Serum: 7.8 mg/dL (ref 4.0–7.8)

## 2010-03-31 LAB — MICROALBUMIN / CREATININE URINE RATIO
Creatinine,U: 108.3 mg/dL
Microalb Creat Ratio: 0.4 mg/g (ref 0.0–30.0)

## 2010-04-06 ENCOUNTER — Encounter: Payer: Self-pay | Admitting: Internal Medicine

## 2010-04-06 ENCOUNTER — Ambulatory Visit (INDEPENDENT_AMBULATORY_CARE_PROVIDER_SITE_OTHER): Payer: Managed Care, Other (non HMO) | Admitting: Internal Medicine

## 2010-04-06 DIAGNOSIS — E785 Hyperlipidemia, unspecified: Secondary | ICD-10-CM

## 2010-04-06 DIAGNOSIS — E119 Type 2 diabetes mellitus without complications: Secondary | ICD-10-CM

## 2010-04-06 DIAGNOSIS — I10 Essential (primary) hypertension: Secondary | ICD-10-CM

## 2010-04-06 DIAGNOSIS — M109 Gout, unspecified: Secondary | ICD-10-CM

## 2010-04-14 NOTE — Assessment & Plan Note (Signed)
Summary: 4 MO FU/NWS#   Vital Signs:  Patient profile:   65 year old male Height:      69 inches Weight:      206 pounds BMI:     30.53 Temp:     98.3 degrees F oral Pulse rate:   72 / minute Pulse rhythm:   regular Resp:     16 per minute BP sitting:   130 / 80  (left arm) Cuff size:   regular  Vitals Entered By: Lanier Prude, CMA(AAMA) (April 06, 2010 8:18 AM) CC: 4 mo f/u  Is Patient Diabetic? Yes Comments pt needs Rf on Hemril and Proctosol   Primary Care Provider:  Tresa Garter MD  CC:  4 mo f/u .  History of Present Illness: The patient presents for a follow up of hypertension, diabetes, hyperlipidemia and gout  Current Medications (verified): 1)  Aspirin 325 Mg Tabs (Aspirin) .... Take 1 Tab By Mouth Every Day 2)  Flonase 50 Mcg/act  Susp (Fluticasone Propionate) .Marland Kitchen.. 1-2 Spr Once Daily Prn 3)  Vitamin D3 2000 Unit Caps (Cholecalciferol) .... Once Daily 4)  Glucosamine-Chondroitin 750-600 Mg  Tabs (Glucosamine-Chondroitin) .Marland Kitchen.. 1 By Mouth Bid 5)  Hemril-30 30 Mg Supp (Hydrocortisone Acetate) .Marland Kitchen.. 1 Per Rectum Two Times A Day X 10 Days 6)  Meloxicam 15 Mg Tabs (Meloxicam) .... One By Mouth Daily 7)  Mavik 4 Mg Tabs (Trandolapril) .Marland Kitchen.. 1 By Mouth Qd 8)  Allopurinol 100 Mg Tabs (Allopurinol) .Marland Kitchen.. 1 By Mouth Daily 9)  Pennsaid 1.5 % Soln (Diclofenac Sodium) .... 3-5 Gtt To Skin Three Times Daily 10)  Proctosol Hc 2.5 % Crea (Hydrocortisone) .... Use Two Times A Day Prn 11)  Kombiglyze Xr 06-998 Mg Xr24h-Tab (Saxagliptin-Metformin) .Marland Kitchen.. 1 By Mouth Qd 12)  Crestor 10 Mg Tabs (Rosuvastatin Calcium) .Marland Kitchen.. 1 By Mouth Once Daily For Cholesterol  Allergies (verified): 1)  ! Sulfa 2)  Lipitor 3)  Metformin Hcl  Past History:  Past Medical History: Last updated: 07/23/2009 Hx of HERPES ZOSTER (ICD-053.9) ANAL FISSURE, HX OF (ICD-V13.3) Hx of DIVERTICULOSIS, COLON (ICD-562.10) OSTEOARTHRITIS (ICD-715.90) ABNORMAL GLUCOSE NEC (ICD-790.29) ALLERGIC RHINITIS  (ICD-477.9) HYPERTENSION (ICD-401.9) HYPERLIPIDEMIA (ICD-272.4) GOUT (ICD-274.9) COLONIC POLYPS, HX OF (ICD-V12.72) ESSENTIAL HYPERTENSION, BENIGN (ICD-401.1)  Diabetes mellitus, type II 2011  Past Surgical History: Last updated: 07/12/2007 * Hx of LIPOMA REMOVED FROM THE NECK. TONSILLECTOMY, HX OF (ICD-V45.79) HEMORRHOIDECTOMY, HX OF (ICD-V45.89)  Social History: Last updated: 03/30/2009 Married Never Smoked Alcohol use-no Drug use-no Regular exercise-no  Review of Systems  The patient denies fever, dyspnea on exertion, abdominal pain, melena, and difficulty walking.    Physical Exam  General:  alert, well-developed, well-nourished, well-hydrated, appropriate dress, normal appearance, healthy-appearing, cooperative to examination, and good hygiene.   Head:  normocephalic, atraumatic, no abnormalities observed, and no abnormalities palpated.   Nose:  nasal discharge, mucosal erythema, and mucosal edema.  no airflow obstruction, no intranasal foreign body, no nasal polyps, no nasal mucosal lesions, no mucosal friability, no active bleeding or clots, no septum abnormalities, nasal dischargemucosal pallor, mucosal erythema, mucosal edema, L maxillary sinus tenderness, and R maxillary sinus tenderness.   Mouth:  Oral mucosa and oropharynx without lesions or exudates.  Teeth in good repair. Neck:  supple, full ROM, no masses, no thyromegaly, no JVD, normal carotid upstroke, no carotid bruits, no cervical lymphadenopathy, and no neck tenderness.   Lungs:  Normal respiratory effort, chest expands symmetrically. Lungs are clear to auscultation, no crackles or wheezes. Heart:  Normal rate and  regular rhythm. S1 and S2 normal without gallop, murmur, click, rub or other extra sounds. Abdomen:  Bowel sounds positive,abdomen soft and non-tender without masses, organomegaly or hernias noted. Msk:  No deformity or scoliosis noted of thoracic or lumbar spine.   Extremities:  No clubbing,  cyanosis, edema, or deformity noted with normal full range of motion of all joints.   Neurologic:  No cranial nerve deficits noted. Station and gait are normal. Plantar reflexes are down-going bilaterally. DTRs are symmetrical throughout. Sensory, motor and coordinative functions appear intact. Skin:  Intact without suspicious lesions or rashes Psych:  Cognition and judgment appear intact. Alert and cooperative with normal attention span and concentration. No apparent delusions, illusions, hallucinations   Impression & Recommendations:  Problem # 1:  DIABETES MELLITUS, TYPE II (ICD-250.00) Assessment Improved  Lost wt His updated medication list for this problem includes:    Aspirin 325 Mg Tabs (Aspirin) .Marland Kitchen... Take 1 tab by mouth every day    Mavik 4 Mg Tabs (Trandolapril) .Marland Kitchen... 1 by mouth qd    Kombiglyze Xr 06-998 Mg Xr24h-tab (Saxagliptin-metformin) .Marland Kitchen... 1 by mouth qd  Labs Reviewed: Creat: 1.5 (03/31/2010)    Reviewed HgBA1c results: 7.4 (03/31/2010)  7.8 (11/26/2009)  Problem # 2:  HYPERLIPIDEMIA (ICD-272.4) Assessment: Unchanged  His updated medication list for this problem includes:    Crestor 10 Mg Tabs (Rosuvastatin calcium) .Marland Kitchen... 1 by mouth once daily for cholesterol  Problem # 3:  HYPERTENSION (ICD-401.9) Assessment: Unchanged  His updated medication list for this problem includes:    Mavik 4 Mg Tabs (Trandolapril) .Marland Kitchen... 1 by mouth qd  Problem # 4:  OSTEOARTHRITIS (ICD-715.90) Assessment: Unchanged  His updated medication list for this problem includes:    Aspirin 325 Mg Tabs (Aspirin) .Marland Kitchen... Take 1 tab by mouth every day    Meloxicam 15 Mg Tabs (Meloxicam) ..... One by mouth daily  Problem # 5:  GOUT (ICD-274.9) Assessment: Improved The labs were reviewed with the patient.  The following medications were removed from the medication list:    Allopurinol 100 Mg Tabs (Allopurinol) .Marland Kitchen... 1 by mouth daily His updated medication list for this problem includes:     Allopurinol 300 Mg Tabs (Allopurinol) .Marland Kitchen... 1 by mouth once daily for gout prophylaxis  Complete Medication List: 1)  Aspirin 325 Mg Tabs (Aspirin) .... Take 1 tab by mouth every day 2)  Flonase 50 Mcg/act Susp (Fluticasone propionate) .Marland Kitchen.. 1-2 spr once daily prn 3)  Vitamin D3 2000 Unit Caps (Cholecalciferol) .... Once daily 4)  Glucosamine-chondroitin 750-600 Mg Tabs (Glucosamine-chondroitin) .Marland Kitchen.. 1 by mouth bid 5)  Hemril-30 30 Mg Supp (Hydrocortisone acetate) .Marland Kitchen.. 1 per rectum two times a day x 10 days 6)  Meloxicam 15 Mg Tabs (Meloxicam) .... One by mouth daily 7)  Mavik 4 Mg Tabs (Trandolapril) .Marland Kitchen.. 1 by mouth qd 8)  Pennsaid 1.5 % Soln (Diclofenac sodium) .... 3-5 gtt to skin three times daily 9)  Proctosol Hc 2.5 % Crea (Hydrocortisone) .... Use two times a day prn 10)  Kombiglyze Xr 06-998 Mg Xr24h-tab (Saxagliptin-metformin) .Marland Kitchen.. 1 by mouth qd 11)  Crestor 10 Mg Tabs (Rosuvastatin calcium) .Marland Kitchen.. 1 by mouth once daily for cholesterol 12)  Allopurinol 300 Mg Tabs (Allopurinol) .Marland Kitchen.. 1 by mouth once daily for gout prophylaxis  Patient Instructions: 1)  Please schedule a follow-up appointment in 4 months. 2)  BMP prior to visit, ICD-9: 3)  Hepatic Panel prior to visit, ICD-9: 4)  Lipid Panel prior to visit, ICD-9: 5)  HbgA1C prior to visit, ICD-9: 6)  uric acid 257.9  250.00 Prescriptions: PROCTOSOL HC 2.5 % CREA (HYDROCORTISONE) use two times a day prn  #90 g x 3   Entered and Authorized by:   Tresa Garter MD   Signed by:   Tresa Garter MD on 04/06/2010   Method used:   Print then Give to Patient   RxID:   [id number] HEMRIL-30 30 MG SUPP (HYDROCORTISONE ACETATE) 1 per rectum two times a day x 10 days  #20 x 3   Entered and Authorized by:   Tresa Garter MD   Signed by:   Tresa Garter MD on 04/06/2010   Method used:   Print then Give to Patient   RxID:   [id number] ALLOPURINOL 300 MG TABS (ALLOPURINOL) 1 by mouth once daily for gout  prophylaxis  #90 x 3   Entered and Authorized by:   Tresa Garter MD   Signed by:   Tresa Garter MD on 04/06/2010   Method used:   Print then Give to Patient   RxID:   [id number]    Orders Added: 1)  Est. Patient Level IV [66440]

## 2010-06-21 NOTE — Assessment & Plan Note (Signed)
[facility]                           PRIMARY CARE OFFICE NOTE   Douglas Richards, Douglas Richards                       MRN:          [id number]  DATE:08/03/2006                            DOB:          1945/04/29    The patient is a 65 year old male who presents for a wellness  examination.   PAST MEDICAL HISTORY:  As per Jul 06, 2005 note.   FAMILY HISTORY:  As per Jul 06, 2005 note.  His mother passed away  recently following a long illness.   SOCIAL HISTORY:  As per Jul 06, 2005 note.   CURRENT MEDICATIONS:  Reviewed.  He ran out of Crestor lately.   ALLERGIES:  Reviewed.   REVIEW OF SYSTEMS:  No chest pain or shortness of breath.  Gained weight  lacking exercise.  No syncope.  No neurologic complaints.  The rest of  the 18-point review of system is negative.   PHYSICAL EXAMINATION:  Blood pressure 145/84.  Pulse 65.  Temperature  97.4  Weight 226 pounds.  HEENT:  Moist mucosa.  NECK:  Supple.  No thyromegaly or bruit.  LUNGS:  Clear to auscultation and percussion.  No wheezes or rales.  HEART:  S1 and S2.  No murmur.  No gallop.  ABDOMEN:  Soft and non-tender.  No organomegaly or mass felt.  LOWER EXTREMITIES:  Without edema.  He is alert, oriented, and appropriate.  Denies being depressed.  SKIN:  Clear.  RECTAL:  Examination deferred by patient due to painful hemorrhoids.   LABS:  Last colonoscopy July 20, 2004 by Dr. Marina Goodell with diverticulosis.  On August 04, 2006:  CBC normal.  Glucose 140.  LDL 43.  Cholesterol 278,  triglycerides 340, HDL 37.9.  TSH 2.43.  PSA 0.6.  Urinalysis normal.  EKG today is normal.   ASSESSMENT AND PLAN:  1. Normal wellness examination.  Age/health issues discussed.  Healthy      lifestyle discussed.  Appropriate vaccinations discussed.      Colonoscopy is up to date.  Repeat exam in 12 months.  2. 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  Obtain consultation      with Dr. Kendrick Ranch.  Advised Citrucel daily.  3.  Hypertension.  On therapy.  May need to adjust.  I will see him      back in 3 months.  He will try to lose weight.  Cut back on salt.  4. Dyslipidemia.  Restart Crestor.  Check lipids in 3 months.  5. Elevated glucose.  Likely related to his weight gain.  He will work      on his diet and weight.     Georgina Quint. Plotnikov, MD  Electronically Signed    AVP/MedQ  DD: 08/08/2006  DT: 08/08/2006  Job #: [id number]   cc:   Sheppard Plumber. Earlene Plater, M.D.

## 2010-06-24 NOTE — Assessment & Plan Note (Signed)
Douglas Richards                                   ON-CALL NOTE   BRISCOE, DANIELLO                       MRN:          [id number]  DATE:11/28/2005                            DOB:          November 02, 1945    Called from [phone] at 6:04 p.m. on November 28, 2005, stating he saw Dr.  Jonny Ruiz this morning and was told a pain medicine and muscle relaxer will be  called into the pharmacy.  He said when he got to the pharmacy, he did get  his muscle relaxer and his Indocin, but nothing was called in for pain.  I  explained to him that the Indocin was for pain, and if that did not work, he  would need to call Dr. Jonny Ruiz in the morning to get something different.       Lelon Perla, DO      YRL/MedQ  DD:  11/28/2005  DT:  11/29/2005  Job #:  [id number]   cc:   Corwin Levins, MD

## 2010-08-01 ENCOUNTER — Other Ambulatory Visit: Payer: Self-pay | Admitting: Internal Medicine

## 2010-08-01 ENCOUNTER — Other Ambulatory Visit (INDEPENDENT_AMBULATORY_CARE_PROVIDER_SITE_OTHER): Payer: Managed Care, Other (non HMO)

## 2010-08-01 DIAGNOSIS — E299 Testicular dysfunction, unspecified: Secondary | ICD-10-CM

## 2010-08-01 DIAGNOSIS — E119 Type 2 diabetes mellitus without complications: Secondary | ICD-10-CM

## 2010-08-01 LAB — HEPATIC FUNCTION PANEL
AST: 18 U/L (ref 0–37)
Bilirubin, Direct: 0.1 mg/dL (ref 0.0–0.3)
Total Bilirubin: 0.6 mg/dL (ref 0.3–1.2)

## 2010-08-01 LAB — LIPID PANEL
Cholesterol: 176 mg/dL (ref 0–200)
LDL Cholesterol: 98 mg/dL (ref 0–99)
Total CHOL/HDL Ratio: 4
VLDL: 30.8 mg/dL (ref 0.0–40.0)

## 2010-08-01 LAB — BASIC METABOLIC PANEL
BUN: 19 mg/dL (ref 6–23)
Chloride: 105 mEq/L (ref 96–112)
GFR: 51.12 mL/min — ABNORMAL LOW (ref >60.00)
Potassium: 4.6 mEq/L (ref 3.5–5.1)
Sodium: 136 mEq/L (ref 135–145)

## 2010-08-01 LAB — URIC ACID: Uric Acid, Serum: 6.1 mg/dL (ref 4.0–7.8)

## 2010-08-03 ENCOUNTER — Encounter: Payer: Self-pay | Admitting: Internal Medicine

## 2010-08-04 ENCOUNTER — Encounter: Payer: Self-pay | Admitting: Internal Medicine

## 2010-08-05 ENCOUNTER — Encounter: Payer: Self-pay | Admitting: Internal Medicine

## 2010-08-05 ENCOUNTER — Ambulatory Visit (INDEPENDENT_AMBULATORY_CARE_PROVIDER_SITE_OTHER): Payer: Managed Care, Other (non HMO) | Admitting: Internal Medicine

## 2010-08-05 DIAGNOSIS — E785 Hyperlipidemia, unspecified: Secondary | ICD-10-CM

## 2010-08-05 DIAGNOSIS — M109 Gout, unspecified: Secondary | ICD-10-CM

## 2010-08-05 DIAGNOSIS — I10 Essential (primary) hypertension: Secondary | ICD-10-CM

## 2010-08-05 DIAGNOSIS — E119 Type 2 diabetes mellitus without complications: Secondary | ICD-10-CM

## 2010-08-05 MED ORDER — FEBUXOSTAT 80 MG PO TABS: 1.0000 | ORAL_TABLET | ORAL | Status: DC

## 2010-08-05 NOTE — Progress Notes (Signed)
  Subjective:    Patient ID: Douglas Richards, male    DOB: 03/21/1945, 65 y.o.   MRN: [id number]  HPI  The patient presents for a follow-up of  chronic hypertension, chronic dyslipidemia, type 2 diabetes controlled with medicines, gout. C/o itching from allopurinol    Review of Systems  Constitutional: Negative for appetite change, fatigue and unexpected weight change.  HENT: Negative for nosebleeds, congestion, sore throat, sneezing, trouble swallowing and neck pain.   Eyes: Negative for itching and visual disturbance.  Respiratory: Negative for cough.   Cardiovascular: Negative for chest pain, palpitations and leg swelling.  Gastrointestinal: Negative for nausea, diarrhea, blood in stool and abdominal distention.  Genitourinary: Negative for frequency and hematuria.  Musculoskeletal: Positive for arthralgias. Negative for back pain, joint swelling and gait problem.  Skin: Negative for rash.  Neurological: Negative for dizziness, tremors, speech difficulty and weakness.  Psychiatric/Behavioral: Negative for sleep disturbance, dysphoric mood and agitation. The patient is not nervous/anxious.        Objective:   Physical Exam  Constitutional: He is oriented to person, place, and time. He appears well-developed.  HENT:  Mouth/Throat: Oropharynx is clear and moist.  Eyes: Conjunctivae are normal. Pupils are equal, round, and reactive to light.  Neck: Normal range of motion. No JVD present. No thyromegaly present.  Cardiovascular: Normal rate, regular rhythm, normal heart sounds and intact distal pulses.  Exam reveals no gallop and no friction rub.   No murmur heard. Pulmonary/Chest: Effort normal and breath sounds normal. No respiratory distress. He has no wheezes. He has no rales. He exhibits no tenderness.  Abdominal: Soft. Bowel sounds are normal. He exhibits no distension and no mass. There is no tenderness. There is no rebound and no guarding.  Musculoskeletal: Normal range of  motion. He exhibits no edema and no tenderness.  Lymphadenopathy:    He has no cervical adenopathy.  Neurological: He is alert and oriented to person, place, and time. He has normal reflexes. No cranial nerve deficit. He exhibits normal muscle tone. Coordination normal.  Skin: Skin is warm and dry. No rash noted.  Psychiatric: He has a normal mood and affect. His behavior is normal. Judgment and thought content normal.        Lab Results  Component Value Date   WBC 6.4 07/15/2009   HGB 13.1 07/15/2009   HCT 38.3* 07/15/2009   PLT 145.0* 07/15/2009   CHOL 176 08/01/2010   TRIG 154.0* 08/01/2010   HDL 47.40 08/01/2010   LDLDIRECT 100.3 11/26/2009   ALT 21 08/01/2010   AST 18 08/01/2010   NA 136 08/01/2010   K 4.6 08/01/2010   CL 105 08/01/2010   CREATININE 1.5 08/01/2010   BUN 19 08/01/2010   CO2 25 08/01/2010   TSH 2.30 07/15/2009   PSA 0.71 07/15/2009   HGBA1C 7.2* 08/01/2010   MICROALBUR 0.4 03/31/2010     Assessment & Plan:   Wt Readings from Last 3 Encounters:  08/05/10 201 lb (91.173 kg)  04/06/10 206 lb (93.441 kg)  12/02/09 209 lb (94.802 kg)

## 2010-08-05 NOTE — Assessment & Plan Note (Signed)
Cont Rx 

## 2010-08-05 NOTE — Assessment & Plan Note (Signed)
Well - however has itching from Allopurinol. Start Uloric

## 2010-08-05 NOTE — Assessment & Plan Note (Signed)
Better On Rx 

## 2010-11-29 ENCOUNTER — Other Ambulatory Visit (INDEPENDENT_AMBULATORY_CARE_PROVIDER_SITE_OTHER): Payer: Managed Care, Other (non HMO)

## 2010-11-29 DIAGNOSIS — E119 Type 2 diabetes mellitus without complications: Secondary | ICD-10-CM

## 2010-11-29 DIAGNOSIS — M109 Gout, unspecified: Secondary | ICD-10-CM

## 2010-11-29 LAB — COMPREHENSIVE METABOLIC PANEL
ALT: 17 U/L (ref 0–53)
AST: 19 U/L (ref 0–37)
Albumin: 4.2 g/dL (ref 3.5–5.2)
Alkaline Phosphatase: 41 U/L (ref 39–117)
Potassium: 4.1 mEq/L (ref 3.5–5.1)
Sodium: 138 mEq/L (ref 135–145)
Total Bilirubin: 0.6 mg/dL (ref 0.3–1.2)
Total Protein: 7.1 g/dL (ref 6.0–8.3)

## 2010-11-29 LAB — URIC ACID: Uric Acid, Serum: 6.4 mg/dL (ref 4.0–7.8)

## 2010-12-05 ENCOUNTER — Encounter: Payer: Self-pay | Admitting: Internal Medicine

## 2010-12-05 ENCOUNTER — Ambulatory Visit (INDEPENDENT_AMBULATORY_CARE_PROVIDER_SITE_OTHER): Payer: 59 | Admitting: Internal Medicine

## 2010-12-05 VITALS — BP 162/98 | HR 75 | Temp 98.2°F | Wt 204.0 lb

## 2010-12-05 DIAGNOSIS — E119 Type 2 diabetes mellitus without complications: Secondary | ICD-10-CM

## 2010-12-05 DIAGNOSIS — E785 Hyperlipidemia, unspecified: Secondary | ICD-10-CM

## 2010-12-05 DIAGNOSIS — I10 Essential (primary) hypertension: Secondary | ICD-10-CM

## 2010-12-05 DIAGNOSIS — M109 Gout, unspecified: Secondary | ICD-10-CM

## 2010-12-05 DIAGNOSIS — M1990 Unspecified osteoarthritis, unspecified site: Secondary | ICD-10-CM

## 2010-12-05 MED ORDER — TRANDOLAPRIL 4 MG PO TABS: 4.0000 mg | ORAL_TABLET | Freq: Every day | ORAL | Status: DC

## 2010-12-05 MED ORDER — ALLOPURINOL 300 MG PO TABS: 300.0000 mg | ORAL_TABLET | Freq: Every day | ORAL | Status: DC

## 2010-12-05 MED ORDER — SAXAGLIPTIN-METFORMIN ER 5-1000 MG PO TB24: 1.0000 | ORAL_TABLET | Freq: Every day | ORAL | Status: DC

## 2010-12-05 MED ORDER — MELOXICAM 15 MG PO TABS: 15.0000 mg | ORAL_TABLET | Freq: Every day | ORAL | Status: DC

## 2010-12-05 NOTE — Assessment & Plan Note (Signed)
Continue with current prescription therapy as reflected on the Med list.  

## 2010-12-05 NOTE — Progress Notes (Signed)
.   Subjective:    Patient ID: Douglas Richards, male    DOB: [birth year], 65 y.o.   MRN: [id number]  HPI  The patient presents for a follow-up of  chronic hypertension, chronic dyslipidemia, type 2 diabetes, gout controlled with medicines. He got laid off. Stress w/stepson    Review of Systems  Constitutional: Negative for appetite change, fatigue and unexpected weight change.  HENT: Negative for nosebleeds, congestion, sore throat, sneezing, trouble swallowing and neck pain.   Eyes: Negative for itching and visual disturbance.  Respiratory: Negative for cough.   Cardiovascular: Negative for chest pain, palpitations and leg swelling.  Gastrointestinal: Negative for nausea, diarrhea, blood in stool and abdominal distention.  Genitourinary: Negative for frequency and hematuria.  Musculoskeletal: Negative for back pain, joint swelling and gait problem.  Skin: Negative for rash.  Neurological: Negative for dizziness, tremors, speech difficulty and weakness.  Psychiatric/Behavioral: Negative for suicidal ideas, sleep disturbance, dysphoric mood and agitation. The patient is nervous/anxious (stressed).        Objective:   Physical Exam  Constitutional: He is oriented to person, place, and time. He appears well-developed.       obese  HENT:  Mouth/Throat: Oropharynx is clear and moist.  Eyes: Conjunctivae are normal. Pupils are equal, round, and reactive to light.  Neck: Normal range of motion. No JVD present. No thyromegaly present.  Cardiovascular: Normal rate, regular rhythm, normal heart sounds and intact distal pulses.  Exam reveals no gallop and no friction rub.   No murmur heard. Pulmonary/Chest: Effort normal and breath sounds normal. No respiratory distress. He has no wheezes. He has no rales. He exhibits no tenderness.  Abdominal: Soft. Bowel sounds are normal. He exhibits no distension and no mass. There is no tenderness. There is no rebound and no guarding.  Musculoskeletal:  Normal range of motion. He exhibits no edema and no tenderness.  Lymphadenopathy:    He has no cervical adenopathy.  Neurological: He is alert and oriented to person, place, and time. He has normal reflexes. No cranial nerve deficit. He exhibits normal muscle tone. Coordination normal.  Skin: Skin is warm and dry. No rash noted.  Psychiatric: He has a normal mood and affect. His behavior is normal. Judgment and thought content normal.       sad    Wt Readings from Last 3 Encounters:  12/05/10 204 lb (92.534 kg)  08/05/10 201 lb (91.173 kg)  04/06/10 206 lb (93.441 kg)     Lab Results  Component Value Date   WBC 6.4 07/15/2009   HGB 13.1 07/15/2009   HCT 38.3* 07/15/2009   PLT 145.0* 07/15/2009   GLUCOSE 103* 11/29/2010   CHOL 176 08/01/2010   TRIG 154.0* 08/01/2010   HDL 47.40 08/01/2010   LDLDIRECT 100.3 11/26/2009   LDLCALC 98 08/01/2010   ALT 17 11/29/2010   AST 19 11/29/2010   NA 138 11/29/2010   K 4.1 11/29/2010   CL 101 11/29/2010   CREATININE 1.6* 11/29/2010   BUN 24* 11/29/2010   CO2 28 11/29/2010   TSH 2.30 07/15/2009   PSA 0.71 07/15/2009   HGBA1C 6.9* 11/29/2010   MICROALBUR 0.4 03/31/2010       Assessment & Plan:

## 2010-12-05 NOTE — Assessment & Plan Note (Signed)
Continue with current prescription therapy as reflected on the Med list. Nl BP at home per pt 

## 2011-01-13 ENCOUNTER — Encounter: Payer: Self-pay | Admitting: Internal Medicine

## 2011-01-13 ENCOUNTER — Ambulatory Visit (INDEPENDENT_AMBULATORY_CARE_PROVIDER_SITE_OTHER): Payer: 59 | Admitting: Internal Medicine

## 2011-01-13 ENCOUNTER — Other Ambulatory Visit: Payer: Self-pay | Admitting: *Deleted

## 2011-01-13 VITALS — BP 148/88 | HR 84 | Temp 98.2°F | Resp 16 | Wt 206.0 lb

## 2011-01-13 DIAGNOSIS — I10 Essential (primary) hypertension: Secondary | ICD-10-CM

## 2011-01-13 DIAGNOSIS — E119 Type 2 diabetes mellitus without complications: Secondary | ICD-10-CM

## 2011-01-13 DIAGNOSIS — J209 Acute bronchitis, unspecified: Secondary | ICD-10-CM

## 2011-01-13 MED ORDER — LEVOFLOXACIN 500 MG PO TABS: 500.0000 mg | ORAL_TABLET | Freq: Every day | ORAL | Status: AC

## 2011-01-13 MED ORDER — BENZONATATE 100 MG PO CAPS: 100.0000 mg | ORAL_CAPSULE | Freq: Three times a day (TID) | ORAL | Status: AC | PRN

## 2011-01-13 MED ORDER — BENZONATATE 100 MG PO CAPS: 100.0000 mg | ORAL_CAPSULE | Freq: Three times a day (TID) | ORAL | Status: DC | PRN

## 2011-01-13 MED ORDER — PROMETHAZINE-CODEINE 6.25-10 MG/5ML PO SYRP: 5.0000 mL | ORAL_SOLUTION | ORAL | Status: AC | PRN

## 2011-01-13 MED ORDER — LEVOFLOXACIN 500 MG PO TABS: 500.0000 mg | ORAL_TABLET | Freq: Every day | ORAL | Status: DC

## 2011-01-13 NOTE — Assessment & Plan Note (Signed)
Declined CXR Levaquin and other meds

## 2011-01-13 NOTE — Assessment & Plan Note (Signed)
Nl BP at home per pt Continue with current prescription therapy as reflected on the Med list.

## 2011-01-13 NOTE — Assessment & Plan Note (Signed)
Continue with current prescription therapy as reflected on the Med list.  

## 2011-01-13 NOTE — Progress Notes (Signed)
Per pt- his new pharmacy he uses is [facility] outpatient. I sent new meds to [facility] and called [facility] to cancel rxs there.

## 2011-01-13 NOTE — Progress Notes (Signed)
  Subjective:    Patient ID: Douglas Richards, male    DOB: March 10, 1945, 65 y.o.   MRN: [id number]  HPI   HPI  C/o URI sx's x  7 days. C/o ST, cough, weakness. Not better with OTC medicines. Actually, the patient is getting worse. The patient did not sleep last night due to cough. F/u elev BP and elev glu  Review of Systems  Constitutional: Positive for fever, chills and fatigue.  HENT: Positive for congestion, rhinorrhea, sneezing and postnasal drip.   Eyes: Positive for photophobia and pain. Negative for discharge and visual disturbance.  Respiratory: Positive for prod for brown sputum cough and wheezing.   Positive for chest pain.  Gastrointestinal: Negative for vomiting, abdominal pain, diarrhea and abdominal distention.  Genitourinary: Negative for dysuria and difficulty urinating.  Skin: Negative for rash.  Neurological: Positive for dizziness, weakness and light-headedness.  BP, CBGs ok at home    Review of Systems  Respiratory: Wheezing: mild on R.   Psychiatric/Behavioral: Positive for sleep disturbance.       Objective:   Physical Exam  Constitutional: He is oriented to person, place, and time. He appears well-developed.       Obese Deep cough  HENT:  Mouth/Throat: No oropharyngeal exudate.       eryth mucosa  Eyes: Conjunctivae are normal. Pupils are equal, round, and reactive to light.  Neck: Normal range of motion. No JVD present. No thyromegaly present.  Cardiovascular: Normal rate, regular rhythm, normal heart sounds and intact distal pulses.  Exam reveals no gallop and no friction rub.   No murmur heard. Pulmonary/Chest: Effort normal. No respiratory distress. He has wheezes (R>L). He has no rales. He exhibits no tenderness.  Abdominal: Soft. Bowel sounds are normal. He exhibits no distension and no mass. There is no tenderness. There is no rebound and no guarding.  Musculoskeletal: Normal range of motion. He exhibits no edema and no tenderness.    Lymphadenopathy:    He has no cervical adenopathy.  Neurological: He is alert and oriented to person, place, and time. He has normal reflexes. No cranial nerve deficit. He exhibits normal muscle tone. Coordination normal.  Skin: Skin is warm and dry. No rash noted.  Psychiatric: He has a normal mood and affect. His behavior is normal. Judgment and thought content normal.          Assessment & Plan:

## 2011-02-02 ENCOUNTER — Other Ambulatory Visit: Payer: Self-pay

## 2011-02-02 MED ORDER — HYDROCORTISONE ACETATE 30 MG RE SUPP: 1.0000 | Freq: Two times a day (BID) | RECTAL | Status: DC

## 2011-02-02 NOTE — Telephone Encounter (Signed)
Pt called requesting Rx to [facility] pharmacy due to cost at previous pharmacy.

## 2011-04-07 ENCOUNTER — Ambulatory Visit: Payer: Medicare Other | Admitting: Internal Medicine

## 2011-05-31 ENCOUNTER — Other Ambulatory Visit (INDEPENDENT_AMBULATORY_CARE_PROVIDER_SITE_OTHER): Payer: 59

## 2011-05-31 DIAGNOSIS — M109 Gout, unspecified: Secondary | ICD-10-CM

## 2011-05-31 DIAGNOSIS — I10 Essential (primary) hypertension: Secondary | ICD-10-CM

## 2011-05-31 DIAGNOSIS — E119 Type 2 diabetes mellitus without complications: Secondary | ICD-10-CM

## 2011-05-31 LAB — BASIC METABOLIC PANEL
CO2: 23 mEq/L (ref 19–32)
Chloride: 103 mEq/L (ref 96–112)
GFR: 47.97 mL/min — ABNORMAL LOW (ref >60.00)
Glucose, Bld: 133 mg/dL — ABNORMAL HIGH (ref 70–99)
Potassium: 4.3 mEq/L (ref 3.5–5.1)
Sodium: 137 mEq/L (ref 135–145)

## 2011-05-31 LAB — HEPATIC FUNCTION PANEL: Total Bilirubin: 0.6 mg/dL (ref 0.3–1.2)

## 2011-06-07 ENCOUNTER — Encounter: Payer: Self-pay | Admitting: Internal Medicine

## 2011-06-07 ENCOUNTER — Ambulatory Visit (INDEPENDENT_AMBULATORY_CARE_PROVIDER_SITE_OTHER): Payer: Medicare Other | Admitting: Internal Medicine

## 2011-06-07 VITALS — BP 170/85 | HR 80 | Temp 98.1°F | Resp 16 | Wt 206.0 lb

## 2011-06-07 DIAGNOSIS — J309 Allergic rhinitis, unspecified: Secondary | ICD-10-CM

## 2011-06-07 DIAGNOSIS — Z23 Encounter for immunization: Secondary | ICD-10-CM

## 2011-06-07 DIAGNOSIS — Z872 Personal history of diseases of the skin and subcutaneous tissue: Secondary | ICD-10-CM

## 2011-06-07 DIAGNOSIS — E119 Type 2 diabetes mellitus without complications: Secondary | ICD-10-CM

## 2011-06-07 DIAGNOSIS — I10 Essential (primary) hypertension: Secondary | ICD-10-CM

## 2011-06-07 DIAGNOSIS — E785 Hyperlipidemia, unspecified: Secondary | ICD-10-CM

## 2011-06-07 MED ORDER — ALLOPURINOL 300 MG PO TABS: 300.0000 mg | ORAL_TABLET | Freq: Every day | ORAL | Status: DC

## 2011-06-07 MED ORDER — HYDROCORTISONE 2.5 % RE CREA: TOPICAL_CREAM | Freq: Two times a day (BID) | RECTAL | Status: DC | PRN

## 2011-06-07 MED ORDER — FLUTICASONE PROPIONATE 50 MCG/ACT NA SUSP: 2.0000 | Freq: Every day | NASAL | Status: DC

## 2011-06-07 MED ORDER — AMLODIPINE BESYLATE 5 MG PO TABS: 5.0000 mg | ORAL_TABLET | Freq: Every day | ORAL | Status: DC

## 2011-06-07 MED ORDER — HYDROCORTISONE ACETATE 30 MG RE SUPP: 1.0000 | Freq: Two times a day (BID) | RECTAL | Status: DC

## 2011-06-07 NOTE — Progress Notes (Signed)
Patient ID: Douglas Richards, male   DOB: 06-Aug-1945, 66 y.o.   MRN: [id number] . Subjective:    Patient ID: Douglas Richards, male    DOB: 1945/11/15, 66 y.o.   MRN: [id number]  HPI  The patient presents for a follow-up of  chronic hypertension, chronic dyslipidemia, type 2 diabetes, gout controlled with medicines. He got laid off - no jobs. Stress w/stepson still cont on...  BP Readings from Last 3 Encounters:  06/07/11 192/98  01/13/11 148/88  12/05/10 162/98     Review of Systems  Constitutional: Negative for appetite change, fatigue and unexpected weight change.  HENT: Negative for nosebleeds, congestion, sore throat, sneezing, trouble swallowing and neck pain.   Eyes: Negative for itching and visual disturbance.  Respiratory: Negative for cough.   Cardiovascular: Negative for chest pain, palpitations and leg swelling.  Gastrointestinal: Negative for nausea, diarrhea, blood in stool and abdominal distention.  Genitourinary: Negative for frequency and hematuria.  Musculoskeletal: Negative for back pain, joint swelling and gait problem.  Skin: Negative for rash.  Neurological: Negative for dizziness, tremors, speech difficulty and weakness.  Psychiatric/Behavioral: Negative for suicidal ideas, sleep disturbance, dysphoric mood and agitation. The patient is nervous/anxious (stressed).        Objective:   Physical Exam  Constitutional: He is oriented to person, place, and time. He appears well-developed.       obese  HENT:  Mouth/Throat: Oropharynx is clear and moist.  Eyes: Conjunctivae are normal. Pupils are equal, round, and reactive to light.  Neck: Normal range of motion. No JVD present. No thyromegaly present.  Cardiovascular: Normal rate, regular rhythm, normal heart sounds and intact distal pulses.  Exam reveals no gallop and no friction rub.   No murmur heard. Pulmonary/Chest: Effort normal and breath sounds normal. No respiratory distress. He has no wheezes. He has no  rales. He exhibits no tenderness.  Abdominal: Soft. Bowel sounds are normal. He exhibits no distension and no mass. There is no tenderness. There is no rebound and no guarding.  Musculoskeletal: Normal range of motion. He exhibits no edema and no tenderness.  Lymphadenopathy:    He has no cervical adenopathy.  Neurological: He is alert and oriented to person, place, and time. He has normal reflexes. No cranial nerve deficit. He exhibits normal muscle tone. Coordination normal.  Skin: Skin is warm and dry. No rash noted.  Psychiatric: He has a normal mood and affect. His behavior is normal. Judgment and thought content normal.       sad    Wt Readings from Last 3 Encounters:  06/07/11 206 lb (93.441 kg)  01/13/11 206 lb (93.441 kg)  12/05/10 204 lb (92.534 kg)     Lab Results  Component Value Date   WBC 6.4 07/15/2009   HGB 13.1 07/15/2009   HCT 38.3* 07/15/2009   PLT 145.0* 07/15/2009   GLUCOSE 133* 05/31/2011   CHOL 176 08/01/2010   TRIG 154.0* 08/01/2010   HDL 47.40 08/01/2010   LDLDIRECT 100.3 11/26/2009   LDLCALC 98 08/01/2010   ALT 20 05/31/2011   AST 22 05/31/2011   NA 137 05/31/2011   K 4.3 05/31/2011   CL 103 05/31/2011   CREATININE 1.6* 05/31/2011   BUN 26* 05/31/2011   CO2 23 05/31/2011   TSH 2.30 07/15/2009   PSA 0.71 07/15/2009   HGBA1C 7.4* 05/31/2011   MICROALBUR 0.4 03/31/2010       Assessment & Plan:

## 2011-06-07 NOTE — Assessment & Plan Note (Signed)
I'll add Norvasc

## 2011-06-07 NOTE — Assessment & Plan Note (Signed)
Continue with current prescription therapy as reflected on the Med list.  

## 2011-08-30 ENCOUNTER — Other Ambulatory Visit (INDEPENDENT_AMBULATORY_CARE_PROVIDER_SITE_OTHER): Payer: 59

## 2011-08-30 DIAGNOSIS — I10 Essential (primary) hypertension: Secondary | ICD-10-CM

## 2011-08-30 DIAGNOSIS — E119 Type 2 diabetes mellitus without complications: Secondary | ICD-10-CM

## 2011-08-30 DIAGNOSIS — Z872 Personal history of diseases of the skin and subcutaneous tissue: Secondary | ICD-10-CM

## 2011-08-30 DIAGNOSIS — E785 Hyperlipidemia, unspecified: Secondary | ICD-10-CM

## 2011-08-30 DIAGNOSIS — J309 Allergic rhinitis, unspecified: Secondary | ICD-10-CM

## 2011-08-30 LAB — LIPID PANEL
LDL Cholesterol: 66 mg/dL (ref 0–99)
Total CHOL/HDL Ratio: 3

## 2011-08-30 LAB — BASIC METABOLIC PANEL
Chloride: 101 mEq/L (ref 96–112)
Potassium: 4.3 mEq/L (ref 3.5–5.1)
Sodium: 138 mEq/L (ref 135–145)

## 2011-08-30 LAB — HEMOGLOBIN A1C: Hgb A1c MFr Bld: 7.1 % — ABNORMAL HIGH (ref 4.6–6.5)

## 2011-09-07 ENCOUNTER — Ambulatory Visit (INDEPENDENT_AMBULATORY_CARE_PROVIDER_SITE_OTHER): Payer: 59 | Admitting: Internal Medicine

## 2011-09-07 ENCOUNTER — Encounter: Payer: Self-pay | Admitting: Internal Medicine

## 2011-09-07 VITALS — BP 168/90 | HR 84 | Temp 98.2°F | Resp 16 | Wt 202.0 lb

## 2011-09-07 DIAGNOSIS — E785 Hyperlipidemia, unspecified: Secondary | ICD-10-CM

## 2011-09-07 DIAGNOSIS — E119 Type 2 diabetes mellitus without complications: Secondary | ICD-10-CM

## 2011-09-07 DIAGNOSIS — I10 Essential (primary) hypertension: Secondary | ICD-10-CM

## 2011-09-07 DIAGNOSIS — R7309 Other abnormal glucose: Secondary | ICD-10-CM

## 2011-09-07 NOTE — Progress Notes (Signed)
.   Subjective:    Patient ID: Douglas Richards, male    DOB: 06-Jan-1946, 66 y.o.   MRN: [id number]  HPI  The patient presents for a follow-up of  chronic hypertension, chronic dyslipidemia, type 2 diabetes, gout controlled with medicines. He got laid off - no jobs. He is working on projects at home.  Stress w/stepson still cont on...  BP Readings from Last 3 Encounters:  09/07/11 168/90  06/07/11 170/85  01/13/11 148/88   Wt Readings from Last 3 Encounters:  09/07/11 202 lb (91.627 kg)  06/07/11 206 lb (93.441 kg)  01/13/11 206 lb (93.441 kg)      Review of Systems  Constitutional: Negative for appetite change, fatigue and unexpected weight change.  HENT: Negative for nosebleeds, congestion, sore throat, sneezing, trouble swallowing and neck pain.   Eyes: Negative for itching and visual disturbance.  Respiratory: Negative for cough.   Cardiovascular: Negative for chest pain, palpitations and leg swelling.  Gastrointestinal: Negative for nausea, diarrhea, blood in stool and abdominal distention.  Genitourinary: Negative for frequency and hematuria.  Musculoskeletal: Negative for back pain, joint swelling and gait problem.  Skin: Negative for rash.  Neurological: Negative for dizziness, tremors, speech difficulty and weakness.  Psychiatric/Behavioral: Negative for suicidal ideas, disturbed wake/sleep cycle, dysphoric mood and agitation. The patient is nervous/anxious (stressed).        Objective:   Physical Exam  Constitutional: He is oriented to person, place, and time. He appears well-developed.       obese  HENT:  Mouth/Throat: Oropharynx is clear and moist.  Eyes: Conjunctivae are normal. Pupils are equal, round, and reactive to light.  Neck: Normal range of motion. No JVD present. No thyromegaly present.  Cardiovascular: Normal rate, regular rhythm, normal heart sounds and intact distal pulses.  Exam reveals no gallop and no friction rub.   No murmur  heard. Pulmonary/Chest: Effort normal and breath sounds normal. No respiratory distress. He has no wheezes. He has no rales. He exhibits no tenderness.  Abdominal: Soft. Bowel sounds are normal. He exhibits no distension and no mass. There is no tenderness. There is no rebound and no guarding.  Musculoskeletal: Normal range of motion. He exhibits no edema and no tenderness.  Lymphadenopathy:    He has no cervical adenopathy.  Neurological: He is alert and oriented to person, place, and time. He has normal reflexes. No cranial nerve deficit. He exhibits normal muscle tone. Coordination normal.  Skin: Skin is warm and dry. No rash noted.  Psychiatric: He has a normal mood and affect. His behavior is normal. Judgment and thought content normal.       sad       Lab Results  Component Value Date   WBC 6.4 07/15/2009   HGB 13.1 07/15/2009   HCT 38.3* 07/15/2009   PLT 145.0* 07/15/2009   GLUCOSE 133* 08/30/2011   CHOL 147 08/30/2011   TRIG 182.0* 08/30/2011   HDL 44.40 08/30/2011   LDLDIRECT 100.3 11/26/2009   LDLCALC 66 08/30/2011   ALT 20 05/31/2011   AST 22 05/31/2011   NA 138 08/30/2011   K 4.3 08/30/2011   CL 101 08/30/2011   CREATININE 1.6* 08/30/2011   BUN 19 08/30/2011   CO2 25 08/30/2011   TSH 2.30 07/15/2009   PSA 0.71 07/15/2009   HGBA1C 7.1* 08/30/2011   MICROALBUR 0.4 03/31/2010       Assessment & Plan:

## 2011-09-07 NOTE — Assessment & Plan Note (Addendum)
See med adjustment; he was on Amlodipine 1/2 tab/d - start 1 a day

## 2011-09-07 NOTE — Assessment & Plan Note (Signed)
Watching CBGs 

## 2011-09-07 NOTE — Assessment & Plan Note (Signed)
Continue with current prescription therapy as reflected on the Med list.  

## 2012-01-09 ENCOUNTER — Other Ambulatory Visit: Payer: Self-pay | Admitting: Internal Medicine

## 2012-01-10 ENCOUNTER — Ambulatory Visit: Payer: 59 | Admitting: Internal Medicine

## 2012-02-16 ENCOUNTER — Other Ambulatory Visit (INDEPENDENT_AMBULATORY_CARE_PROVIDER_SITE_OTHER): Payer: 59

## 2012-02-16 DIAGNOSIS — I10 Essential (primary) hypertension: Secondary | ICD-10-CM

## 2012-02-16 DIAGNOSIS — R7309 Other abnormal glucose: Secondary | ICD-10-CM

## 2012-02-16 DIAGNOSIS — E119 Type 2 diabetes mellitus without complications: Secondary | ICD-10-CM

## 2012-02-16 LAB — BASIC METABOLIC PANEL
BUN: 24 mg/dL — ABNORMAL HIGH (ref 6–23)
CO2: 27 mEq/L (ref 19–32)
Chloride: 100 mEq/L (ref 96–112)
Glucose, Bld: 157 mg/dL — ABNORMAL HIGH (ref 70–99)
Potassium: 4.1 mEq/L (ref 3.5–5.1)
Sodium: 136 mEq/L (ref 135–145)

## 2012-02-21 ENCOUNTER — Ambulatory Visit (INDEPENDENT_AMBULATORY_CARE_PROVIDER_SITE_OTHER): Payer: 59 | Admitting: Internal Medicine

## 2012-02-21 ENCOUNTER — Encounter: Payer: Self-pay | Admitting: Internal Medicine

## 2012-02-21 VITALS — BP 168/100 | HR 80 | Temp 98.0°F | Resp 16 | Wt 202.0 lb

## 2012-02-21 DIAGNOSIS — E785 Hyperlipidemia, unspecified: Secondary | ICD-10-CM

## 2012-02-21 DIAGNOSIS — I10 Essential (primary) hypertension: Secondary | ICD-10-CM

## 2012-02-21 DIAGNOSIS — M1990 Unspecified osteoarthritis, unspecified site: Secondary | ICD-10-CM

## 2012-02-21 DIAGNOSIS — F439 Reaction to severe stress, unspecified: Secondary | ICD-10-CM | POA: Insufficient documentation

## 2012-02-21 DIAGNOSIS — E119 Type 2 diabetes mellitus without complications: Secondary | ICD-10-CM

## 2012-02-21 DIAGNOSIS — Z733 Stress, not elsewhere classified: Secondary | ICD-10-CM

## 2012-02-21 MED ORDER — SAXAGLIPTIN-METFORMIN ER 2.5-1000 MG PO TB24: 1.0000 | ORAL_TABLET | Freq: Two times a day (BID) | ORAL | Status: DC

## 2012-02-21 MED ORDER — TRANDOLAPRIL 4 MG PO TABS: 4.0000 mg | ORAL_TABLET | Freq: Every day | ORAL | Status: DC

## 2012-02-21 NOTE — Assessment & Plan Note (Signed)
1/14 SBP is 130 at home Continue with current prescription therapy as reflected on the Med list.

## 2012-02-21 NOTE — Assessment & Plan Note (Signed)
He declined counseling/meds

## 2012-02-21 NOTE — Assessment & Plan Note (Signed)
Continue with current prescription therapy as reflected on the Med list.  

## 2012-02-21 NOTE — Progress Notes (Signed)
.   Subjective:    HPI  The patient presents for a follow-up of  chronic hypertension, chronic dyslipidemia, type 2 diabetes, gout controlled with medicines. He got laid off - no jobs. He is working on projects at home.  Stress w/stepson still cont on.Marland KitchenMarland KitchenHis dtr is sick w/migraines...  BP Readings from Last 3 Encounters:  02/21/12 168/100  09/07/11 168/90  06/07/11 170/85   Wt Readings from Last 3 Encounters:  02/21/12 202 lb (91.627 kg)  09/07/11 202 lb (91.627 kg)  06/07/11 206 lb (93.441 kg)      Review of Systems  Constitutional: Negative for appetite change, fatigue and unexpected weight change.  HENT: Negative for nosebleeds, congestion, sore throat, sneezing, trouble swallowing and neck pain.   Eyes: Negative for itching and visual disturbance.  Respiratory: Negative for cough.   Cardiovascular: Negative for chest pain, palpitations and leg swelling.  Gastrointestinal: Negative for nausea, diarrhea, blood in stool and abdominal distention.  Genitourinary: Negative for frequency and hematuria.  Musculoskeletal: Negative for back pain, joint swelling and gait problem.  Skin: Negative for rash.  Neurological: Negative for dizziness, tremors, speech difficulty and weakness.  Psychiatric/Behavioral: Negative for suicidal ideas, sleep disturbance, dysphoric mood and agitation. The patient is nervous/anxious (stressed).        Objective:   Physical Exam  Constitutional: He is oriented to person, place, and time. He appears well-developed.       obese  HENT:  Mouth/Throat: Oropharynx is clear and moist.  Eyes: Conjunctivae normal are normal. Pupils are equal, round, and reactive to light.  Neck: Normal range of motion. No JVD present. No thyromegaly present.  Cardiovascular: Normal rate, regular rhythm, normal heart sounds and intact distal pulses.  Exam reveals no gallop and no friction rub.   No murmur heard. Pulmonary/Chest: Effort normal and breath sounds normal. No  respiratory distress. He has no wheezes. He has no rales. He exhibits no tenderness.  Abdominal: Soft. Bowel sounds are normal. He exhibits no distension and no mass. There is no tenderness. There is no rebound and no guarding.  Musculoskeletal: Normal range of motion. He exhibits no edema and no tenderness.  Lymphadenopathy:    He has no cervical adenopathy.  Neurological: He is alert and oriented to person, place, and time. He has normal reflexes. No cranial nerve deficit. He exhibits normal muscle tone. Coordination normal.  Skin: Skin is warm and dry. No rash noted.  Psychiatric: He has a normal mood and affect. His behavior is normal. Judgment and thought content normal.       sad       Lab Results  Component Value Date   WBC 6.4 07/15/2009   HGB 13.1 07/15/2009   HCT 38.3* 07/15/2009   PLT 145.0* 07/15/2009   GLUCOSE 157* 02/16/2012   CHOL 147 08/30/2011   TRIG 182.0* 08/30/2011   HDL 44.40 08/30/2011   LDLDIRECT 100.3 11/26/2009   LDLCALC 66 08/30/2011   ALT 20 05/31/2011   AST 22 05/31/2011   NA 136 02/16/2012   K 4.1 02/16/2012   CL 100 02/16/2012   CREATININE 1.7* 02/16/2012   BUN 24* 02/16/2012   CO2 27 02/16/2012   TSH 2.30 07/15/2009   PSA 0.71 07/15/2009   HGBA1C 7.7* 02/16/2012   MICROALBUR 0.4 03/31/2010       Assessment & Plan:

## 2012-02-21 NOTE — Assessment & Plan Note (Signed)
Will increase Kombiglyze to bid

## 2012-05-17 ENCOUNTER — Other Ambulatory Visit (INDEPENDENT_AMBULATORY_CARE_PROVIDER_SITE_OTHER): Payer: 59

## 2012-05-17 DIAGNOSIS — F439 Reaction to severe stress, unspecified: Secondary | ICD-10-CM

## 2012-05-17 DIAGNOSIS — E119 Type 2 diabetes mellitus without complications: Secondary | ICD-10-CM

## 2012-05-17 DIAGNOSIS — I10 Essential (primary) hypertension: Secondary | ICD-10-CM

## 2012-05-17 DIAGNOSIS — M1990 Unspecified osteoarthritis, unspecified site: Secondary | ICD-10-CM

## 2012-05-17 DIAGNOSIS — Z733 Stress, not elsewhere classified: Secondary | ICD-10-CM

## 2012-05-17 DIAGNOSIS — E785 Hyperlipidemia, unspecified: Secondary | ICD-10-CM

## 2012-05-17 LAB — BASIC METABOLIC PANEL
CO2: 27 mEq/L (ref 19–32)
Chloride: 99 mEq/L (ref 96–112)
Potassium: 4.3 mEq/L (ref 3.5–5.1)

## 2012-05-17 LAB — HEMOGLOBIN A1C: Hgb A1c MFr Bld: 7.2 % — ABNORMAL HIGH (ref 4.6–6.5)

## 2012-05-17 LAB — URINALYSIS
Nitrite: NEGATIVE
Total Protein, Urine: NEGATIVE
Urine Glucose: NEGATIVE
pH: 5.5 (ref 5.0–8.0)

## 2012-05-17 LAB — CBC WITH DIFFERENTIAL/PLATELET
Basophils Absolute: 0 10*3/uL (ref 0.0–0.1)
Basophils Relative: 0.6 % (ref 0.0–3.0)
Eosinophils Absolute: 0.1 10*3/uL (ref 0.0–0.7)
Hemoglobin: 13.7 g/dL (ref 13.0–17.0)
Lymphocytes Relative: 23.7 % (ref 12.0–46.0)
MCHC: 33.5 g/dL (ref 30.0–36.0)
Monocytes Relative: 7.1 % (ref 3.0–12.0)
Neutro Abs: 5.5 10*3/uL (ref 1.4–7.7)
Neutrophils Relative %: 67.1 % (ref 43.0–77.0)
RBC: 4.45 Mil/uL (ref 4.22–5.81)
RDW: 14.1 % (ref 11.5–14.6)

## 2012-05-17 LAB — LIPID PANEL
Cholesterol: 157 mg/dL (ref 0–200)
Total CHOL/HDL Ratio: 4
Triglycerides: 220 mg/dL — ABNORMAL HIGH (ref 0.0–149.0)
VLDL: 44 mg/dL — ABNORMAL HIGH (ref 0.0–40.0)

## 2012-05-17 LAB — HEPATIC FUNCTION PANEL: Albumin: 4.3 g/dL (ref 3.5–5.2)

## 2012-05-17 LAB — LDL CHOLESTEROL, DIRECT: Direct LDL: 79.8 mg/dL

## 2012-05-17 LAB — URIC ACID: Uric Acid, Serum: 5.7 mg/dL (ref 4.0–7.8)

## 2012-05-21 ENCOUNTER — Encounter: Payer: Self-pay | Admitting: Internal Medicine

## 2012-05-21 ENCOUNTER — Ambulatory Visit (INDEPENDENT_AMBULATORY_CARE_PROVIDER_SITE_OTHER): Payer: 59 | Admitting: Internal Medicine

## 2012-05-21 VITALS — BP 148/90 | HR 76 | Temp 98.0°F | Resp 16 | Ht 68.0 in | Wt 203.0 lb

## 2012-05-21 DIAGNOSIS — M109 Gout, unspecified: Secondary | ICD-10-CM

## 2012-05-21 DIAGNOSIS — E119 Type 2 diabetes mellitus without complications: Secondary | ICD-10-CM

## 2012-05-21 DIAGNOSIS — Z733 Stress, not elsewhere classified: Secondary | ICD-10-CM

## 2012-05-21 DIAGNOSIS — Z0000 Encounter for general adult medical examination without abnormal findings: Secondary | ICD-10-CM

## 2012-05-21 DIAGNOSIS — E785 Hyperlipidemia, unspecified: Secondary | ICD-10-CM

## 2012-05-21 DIAGNOSIS — N400 Enlarged prostate without lower urinary tract symptoms: Secondary | ICD-10-CM

## 2012-05-21 DIAGNOSIS — I10 Essential (primary) hypertension: Secondary | ICD-10-CM

## 2012-05-21 DIAGNOSIS — J309 Allergic rhinitis, unspecified: Secondary | ICD-10-CM

## 2012-05-21 DIAGNOSIS — Z136 Encounter for screening for cardiovascular disorders: Secondary | ICD-10-CM

## 2012-05-21 DIAGNOSIS — Z872 Personal history of diseases of the skin and subcutaneous tissue: Secondary | ICD-10-CM

## 2012-05-21 DIAGNOSIS — F439 Reaction to severe stress, unspecified: Secondary | ICD-10-CM

## 2012-05-21 MED ORDER — ALLOPURINOL 300 MG PO TABS: 300.0000 mg | ORAL_TABLET | Freq: Every day | ORAL | Status: DC

## 2012-05-21 MED ORDER — AMLODIPINE BESYLATE 5 MG PO TABS: 5.0000 mg | ORAL_TABLET | Freq: Every day | ORAL | Status: DC

## 2012-05-21 MED ORDER — TRANDOLAPRIL 4 MG PO TABS: 4.0000 mg | ORAL_TABLET | Freq: Every day | ORAL | Status: DC

## 2012-05-21 NOTE — Assessment & Plan Note (Signed)
Continue with current prescription therapy as reflected on the Med list.  

## 2012-05-21 NOTE — Patient Instructions (Signed)
Saw [facility]

## 2012-05-21 NOTE — Assessment & Plan Note (Signed)
We discussed age appropriate health related issues, including available/recomended screening tests and vaccinations. We discussed a need for adhering to healthy diet and exercise. Labs/EKG were reviewed/ordered. All questions were answered.   

## 2012-05-21 NOTE — Assessment & Plan Note (Signed)
Better - no relapse

## 2012-05-21 NOTE — Assessment & Plan Note (Signed)
7/13 -- elev BP at home per pt  SBP is 120-130 at home Continue with current prescription therapy as reflected on the Med list.

## 2012-05-21 NOTE — Assessment & Plan Note (Signed)
He wanted to try [facility]

## 2012-05-21 NOTE — Progress Notes (Signed)
  Subjective:    HPI   The patient is here for a wellness exam. The patient has been doing well overall without major physical or psychological issues going on lately. BP is nl at home The patient presents for a follow-up of  chronic hypertension, chronic dyslipidemia, type 2 diabetes, gout controlled with medicines. He got laid off - no jobs. He is working on projects at home.  Stress w/stepson is better.Douglas KitchenMarland KitchenHis dtr is sick w/migraines...  BP Readings from Last 3 Encounters:  05/21/12 148/90  02/21/12 168/100  09/07/11 168/90   Wt Readings from Last 3 Encounters:  05/21/12 203 lb (92.08 kg)  02/21/12 202 lb (91.627 kg)  09/07/11 202 lb (91.627 kg)      Review of Systems  Constitutional: Negative for appetite change, fatigue and unexpected weight change.  HENT: Negative for nosebleeds, congestion, sore throat, sneezing, trouble swallowing and neck pain.   Eyes: Negative for itching and visual disturbance.  Respiratory: Negative for cough.   Cardiovascular: Negative for chest pain, palpitations and leg swelling.  Gastrointestinal: Negative for nausea, diarrhea, blood in stool and abdominal distention.  Genitourinary: Negative for frequency and hematuria.  Musculoskeletal: Negative for back pain, joint swelling and gait problem.  Skin: Negative for rash.  Neurological: Negative for dizziness, tremors, speech difficulty and weakness.  Psychiatric/Behavioral: Negative for suicidal ideas, sleep disturbance, dysphoric mood and agitation. The patient is nervous/anxious (stressed).        Objective:   Physical Exam  Constitutional: He is oriented to person, place, and time. He appears well-developed.  obese  HENT:  Mouth/Throat: Oropharynx is clear and moist.  Eyes: Conjunctivae are normal. Pupils are equal, round, and reactive to light.  Neck: Normal range of motion. No JVD present. No thyromegaly present.  Cardiovascular: Normal rate, regular rhythm, normal heart sounds and  intact distal pulses.  Exam reveals no gallop and no friction rub.   No murmur heard. Pulmonary/Chest: Effort normal and breath sounds normal. No respiratory distress. He has no wheezes. He has no rales. He exhibits no tenderness.  Abdominal: Soft. Bowel sounds are normal. He exhibits no distension and no mass. There is no tenderness. There is no rebound and no guarding.  Musculoskeletal: Normal range of motion. He exhibits no edema and no tenderness.  Lymphadenopathy:    He has no cervical adenopathy.  Neurological: He is alert and oriented to person, place, and time. He has normal reflexes. No cranial nerve deficit. He exhibits normal muscle tone. Coordination normal.  Skin: Skin is warm and dry. No rash noted.  Psychiatric: He has a normal mood and affect. His behavior is normal. Judgment and thought content normal.  sad   Rectal - narrow anal canal, NT Prostate 1+ smooth    Lab Results  Component Value Date   WBC 8.2 05/17/2012   HGB 13.7 05/17/2012   HCT 40.8 05/17/2012   PLT 155.0 05/17/2012   GLUCOSE 122* 05/17/2012   CHOL 157 05/17/2012   TRIG 220.0* 05/17/2012   HDL 37.20* 05/17/2012   LDLDIRECT 79.8 05/17/2012   LDLCALC 66 08/30/2011   ALT 21 05/17/2012   AST 19 05/17/2012   NA 134* 05/17/2012   K 4.3 05/17/2012   CL 99 05/17/2012   CREATININE 1.5 05/17/2012   BUN 23 05/17/2012   CO2 27 05/17/2012   TSH 2.48 05/17/2012   PSA 0.91 05/17/2012   HGBA1C 7.2* 05/17/2012   MICROALBUR 0.4 03/31/2010   EKG ok    Assessment & Plan:

## 2012-05-21 NOTE — Assessment & Plan Note (Signed)
Discussed.

## 2012-09-19 ENCOUNTER — Other Ambulatory Visit (INDEPENDENT_AMBULATORY_CARE_PROVIDER_SITE_OTHER): Payer: 59

## 2012-09-19 DIAGNOSIS — E785 Hyperlipidemia, unspecified: Secondary | ICD-10-CM

## 2012-09-19 DIAGNOSIS — E119 Type 2 diabetes mellitus without complications: Secondary | ICD-10-CM

## 2012-09-19 LAB — HEMOGLOBIN A1C: Hgb A1c MFr Bld: 7.3 % — ABNORMAL HIGH (ref 4.6–6.5)

## 2012-09-19 LAB — BASIC METABOLIC PANEL
BUN: 24 mg/dL — ABNORMAL HIGH (ref 6–23)
Calcium: 9.4 mg/dL (ref 8.4–10.5)
Creatinine, Ser: 1.6 mg/dL — ABNORMAL HIGH (ref 0.4–1.5)
GFR: 46.73 mL/min — ABNORMAL LOW (ref >60.00)

## 2012-09-24 ENCOUNTER — Encounter: Payer: Self-pay | Admitting: Internal Medicine

## 2012-09-24 ENCOUNTER — Ambulatory Visit (INDEPENDENT_AMBULATORY_CARE_PROVIDER_SITE_OTHER): Payer: 59 | Admitting: Internal Medicine

## 2012-09-24 VITALS — BP 172/106 | HR 76 | Temp 98.2°F | Resp 16 | Wt 201.0 lb

## 2012-09-24 DIAGNOSIS — I10 Essential (primary) hypertension: Secondary | ICD-10-CM

## 2012-09-24 DIAGNOSIS — E119 Type 2 diabetes mellitus without complications: Secondary | ICD-10-CM

## 2012-09-24 DIAGNOSIS — F439 Reaction to severe stress, unspecified: Secondary | ICD-10-CM

## 2012-09-24 DIAGNOSIS — Z733 Stress, not elsewhere classified: Secondary | ICD-10-CM

## 2012-09-24 NOTE — Assessment & Plan Note (Signed)
Continue with current prescription therapy as reflected on the Med list.  

## 2012-09-24 NOTE — Progress Notes (Signed)
.   Subjective:    HPI  The patient presents for a follow-up of  chronic hypertension, chronic dyslipidemia, type 2 diabetes, gout controlled with medicines. He got laid off - no jobs. He is working on projects at home.  Stress w/stepson still cont on.Marland KitchenMarland KitchenHis dtr is sick w/migraines - much better after scalp decompression surgery - much better.. Diet coke 2l/d  BP Readings from Last 3 Encounters:  09/24/12 172/106  05/21/12 148/90  02/21/12 168/100   Wt Readings from Last 3 Encounters:  09/24/12 201 lb (91.173 kg)  05/21/12 203 lb (92.08 kg)  02/21/12 202 lb (91.627 kg)      Review of Systems  Constitutional: Negative for appetite change, fatigue and unexpected weight change.  HENT: Negative for nosebleeds, congestion, sore throat, sneezing, trouble swallowing and neck pain.   Eyes: Negative for itching and visual disturbance.  Respiratory: Negative for cough.   Cardiovascular: Negative for chest pain, palpitations and leg swelling.  Gastrointestinal: Negative for nausea, diarrhea, blood in stool and abdominal distention.  Genitourinary: Negative for frequency and hematuria.  Musculoskeletal: Negative for back pain, joint swelling and gait problem.  Skin: Negative for rash.  Neurological: Negative for dizziness, tremors, speech difficulty and weakness.  Psychiatric/Behavioral: Negative for suicidal ideas, sleep disturbance, dysphoric mood and agitation. The patient is nervous/anxious (stressed).        Objective:   Physical Exam  Constitutional: He is oriented to person, place, and time. He appears well-developed.  obese  HENT:  Mouth/Throat: Oropharynx is clear and moist.  Eyes: Conjunctivae are normal. Pupils are equal, round, and reactive to light.  Neck: Normal range of motion. No JVD present. No thyromegaly present.  Cardiovascular: Normal rate, regular rhythm, normal heart sounds and intact distal pulses.  Exam reveals no gallop and no friction rub.   No murmur  heard. Pulmonary/Chest: Effort normal and breath sounds normal. No respiratory distress. He has no wheezes. He has no rales. He exhibits no tenderness.  Abdominal: Soft. Bowel sounds are normal. He exhibits no distension and no mass. There is no tenderness. There is no rebound and no guarding.  Musculoskeletal: Normal range of motion. He exhibits no edema and no tenderness.  Lymphadenopathy:    He has no cervical adenopathy.  Neurological: He is alert and oriented to person, place, and time. He has normal reflexes. No cranial nerve deficit. He exhibits normal muscle tone. Coordination normal.  Skin: Skin is warm and dry. No rash noted.  Psychiatric: He has a normal mood and affect. His behavior is normal. Judgment and thought content normal.  sad       Lab Results  Component Value Date   WBC 8.2 05/17/2012   HGB 13.7 05/17/2012   HCT 40.8 05/17/2012   PLT 155.0 05/17/2012   GLUCOSE 146* 09/19/2012   CHOL 157 05/17/2012   TRIG 220.0* 05/17/2012   HDL 37.20* 05/17/2012   LDLDIRECT 79.8 05/17/2012   LDLCALC 66 08/30/2011   ALT 21 05/17/2012   AST 19 05/17/2012   NA 134* 09/19/2012   K 3.9 09/19/2012   CL 101 09/19/2012   CREATININE 1.6* 09/19/2012   BUN 24* 09/19/2012   CO2 23 09/19/2012   TSH 2.48 05/17/2012   PSA 0.91 05/17/2012   HGBA1C 7.3* 09/19/2012   MICROALBUR 0.4 03/31/2010       Assessment & Plan:

## 2012-09-24 NOTE — Assessment & Plan Note (Signed)
Ongoing

## 2012-10-03 ENCOUNTER — Ambulatory Visit (INDEPENDENT_AMBULATORY_CARE_PROVIDER_SITE_OTHER): Payer: 59 | Admitting: Internal Medicine

## 2012-10-03 ENCOUNTER — Telehealth: Payer: Self-pay | Admitting: *Deleted

## 2012-10-03 ENCOUNTER — Encounter: Payer: Self-pay | Admitting: Internal Medicine

## 2012-10-03 VITALS — BP 158/92 | HR 76 | Temp 97.7°F | Resp 16 | Wt 201.0 lb

## 2012-10-03 DIAGNOSIS — R21 Rash and other nonspecific skin eruption: Secondary | ICD-10-CM | POA: Insufficient documentation

## 2012-10-03 MED ORDER — TRIAMCINOLONE ACETONIDE 0.1 % EX LOTN: TOPICAL_LOTION | Freq: Three times a day (TID) | CUTANEOUS | Status: DC

## 2012-10-03 MED ORDER — CETIRIZINE HCL 10 MG PO TABS: 10.0000 mg | ORAL_TABLET | Freq: Every day | ORAL | Status: DC

## 2012-10-03 MED ORDER — HYDROXYZINE HCL 25 MG PO TABS: 25.0000 mg | ORAL_TABLET | Freq: Three times a day (TID) | ORAL | Status: DC | PRN

## 2012-10-03 NOTE — Assessment & Plan Note (Signed)
8/14 ?etiol - no new meds/exposures H/o reaction to po prednisone (??) Start Zyrtec Hydroxyzine Triamc cream Call if not better

## 2012-10-03 NOTE — Progress Notes (Signed)
. Subjective:    Rash This is a new problem. The current episode started in the past 7 days. The problem is unchanged. The rash is diffuse. The rash is characterized by itchiness. He was exposed to nothing. Pertinent negatives include no congestion, cough, diarrhea, fatigue or sore throat. Past treatments include anti-itch cream and antihistamine. The treatment provided no relief.    The patient presents for a follow-up of  chronic hypertension, chronic dyslipidemia, type 2 diabetes, gout controlled with medicines. He got laid off - no jobs. He is working on projects at home.  Stress w/stepson still cont on.Marland KitchenMarland KitchenHis dtr is sick w/migraines - much better after scalp decompression surgery - much better.. Diet coke 2l/d  BP Readings from Last 3 Encounters:  10/03/12 158/92  09/24/12 172/106  05/21/12 148/90   Wt Readings from Last 3 Encounters:  10/03/12 201 lb (91.173 kg)  09/24/12 201 lb (91.173 kg)  05/21/12 203 lb (92.08 kg)      Review of Systems  Constitutional: Negative for appetite change, fatigue and unexpected weight change.  HENT: Negative for nosebleeds, congestion, sore throat, sneezing, trouble swallowing and neck pain.   Eyes: Negative for itching and visual disturbance.  Respiratory: Negative for cough.   Cardiovascular: Negative for chest pain, palpitations and leg swelling.  Gastrointestinal: Negative for nausea, diarrhea, blood in stool and abdominal distention.  Genitourinary: Negative for frequency and hematuria.  Musculoskeletal: Negative for back pain, joint swelling and gait problem.  Skin: Positive for rash.  Neurological: Negative for dizziness, tremors, speech difficulty and weakness.  Psychiatric/Behavioral: Negative for suicidal ideas, sleep disturbance, dysphoric mood and agitation. The patient is nervous/anxious (stressed).        Objective:   Physical Exam  Constitutional: He is oriented to person, place, and time. He appears well-developed.  obese   HENT:  Mouth/Throat: Oropharynx is clear and moist.  Eyes: Conjunctivae are normal. Pupils are equal, round, and reactive to light.  Neck: Normal range of motion. No JVD present. No thyromegaly present.  Cardiovascular: Normal rate, regular rhythm, normal heart sounds and intact distal pulses.  Exam reveals no gallop and no friction rub.   No murmur heard. Pulmonary/Chest: Effort normal and breath sounds normal. No respiratory distress. He has no wheezes. He has no rales. He exhibits no tenderness.  Abdominal: Soft. Bowel sounds are normal. He exhibits no distension and no mass. There is no tenderness. There is no rebound and no guarding.  Musculoskeletal: Normal range of motion. He exhibits no edema and no tenderness.  Lymphadenopathy:    He has no cervical adenopathy.  Neurological: He is alert and oriented to person, place, and time. He has normal reflexes. No cranial nerve deficit. He exhibits normal muscle tone. Coordination normal.  Skin: Skin is warm and dry. No rash noted.  Psychiatric: He has a normal mood and affect. His behavior is normal. Judgment and thought content normal.  sad  Eryth confluent macules on trunk and extremities     Lab Results  Component Value Date   WBC 8.2 05/17/2012   HGB 13.7 05/17/2012   HCT 40.8 05/17/2012   PLT 155.0 05/17/2012   GLUCOSE 146* 09/19/2012   CHOL 157 05/17/2012   TRIG 220.0* 05/17/2012   HDL 37.20* 05/17/2012   LDLDIRECT 79.8 05/17/2012   LDLCALC 66 08/30/2011   ALT 21 05/17/2012   AST 19 05/17/2012   NA 134* 09/19/2012   K 3.9 09/19/2012   CL 101 09/19/2012   CREATININE 1.6* 09/19/2012   BUN  24* 09/19/2012   CO2 23 09/19/2012   TSH 2.48 05/17/2012   PSA 0.91 05/17/2012   HGBA1C 7.3* 09/19/2012   MICROALBUR 0.4 03/31/2010       Assessment & Plan:

## 2012-10-03 NOTE — Telephone Encounter (Signed)
Douglas Richards called states the Rx for of Triamcinolone Lotion is comparable to 6 bottles of lotion but it more comparable to 1 jar of cream.  Please advise whether you want lotion or cream to be ordered.

## 2012-10-03 NOTE — Telephone Encounter (Signed)
Cream is ok per MD. Darel Hong informed.

## 2013-01-15 ENCOUNTER — Other Ambulatory Visit (INDEPENDENT_AMBULATORY_CARE_PROVIDER_SITE_OTHER): Payer: 59

## 2013-01-15 DIAGNOSIS — I10 Essential (primary) hypertension: Secondary | ICD-10-CM

## 2013-01-15 DIAGNOSIS — E119 Type 2 diabetes mellitus without complications: Secondary | ICD-10-CM

## 2013-01-15 LAB — BASIC METABOLIC PANEL
Calcium: 9.4 mg/dL (ref 8.4–10.5)
GFR: 44.1 mL/min — ABNORMAL LOW (ref >60.00)
Glucose, Bld: 152 mg/dL — ABNORMAL HIGH (ref 70–99)
Sodium: 138 mEq/L (ref 135–145)

## 2013-01-15 LAB — HEMOGLOBIN A1C: Hgb A1c MFr Bld: 7.4 % — ABNORMAL HIGH (ref 4.6–6.5)

## 2013-01-21 ENCOUNTER — Other Ambulatory Visit: Payer: Self-pay | Admitting: *Deleted

## 2013-01-21 ENCOUNTER — Encounter: Payer: Self-pay | Admitting: Internal Medicine

## 2013-01-21 ENCOUNTER — Ambulatory Visit (INDEPENDENT_AMBULATORY_CARE_PROVIDER_SITE_OTHER): Payer: 59 | Admitting: Internal Medicine

## 2013-01-21 VITALS — BP 160/94 | HR 80 | Temp 97.9°F | Resp 16 | Wt 197.0 lb

## 2013-01-21 DIAGNOSIS — Z733 Stress, not elsewhere classified: Secondary | ICD-10-CM

## 2013-01-21 DIAGNOSIS — N400 Enlarged prostate without lower urinary tract symptoms: Secondary | ICD-10-CM

## 2013-01-21 DIAGNOSIS — E119 Type 2 diabetes mellitus without complications: Secondary | ICD-10-CM

## 2013-01-21 DIAGNOSIS — F439 Reaction to severe stress, unspecified: Secondary | ICD-10-CM

## 2013-01-21 DIAGNOSIS — M1990 Unspecified osteoarthritis, unspecified site: Secondary | ICD-10-CM

## 2013-01-21 DIAGNOSIS — I10 Essential (primary) hypertension: Secondary | ICD-10-CM

## 2013-01-21 DIAGNOSIS — M109 Gout, unspecified: Secondary | ICD-10-CM

## 2013-01-21 MED ORDER — FINASTERIDE 5 MG PO TABS: 5.0000 mg | ORAL_TABLET | Freq: Every day | ORAL | Status: DC

## 2013-01-21 NOTE — Progress Notes (Signed)
Pre visit review using our clinic review tool, if applicable. No additional management support is needed unless otherwise documented below in the visit note.mh  

## 2013-01-21 NOTE — Assessment & Plan Note (Signed)
No relapse 

## 2013-01-21 NOTE — Assessment & Plan Note (Signed)
Continue with current prescription therapy as reflected on the Med list.  

## 2013-01-21 NOTE — Progress Notes (Signed)
  Subjective:    HPI  The patient presents for a follow-up of  chronic hypertension, chronic dyslipidemia, type 2 diabetes, gout controlled with medicines. He got laid off - no jobs. He is working on projects at home.  Stress w/stepson still cont on.Marland KitchenMarland KitchenHis dtr is sick w/migraines - much better after scalp decompression surgery - much better.. Diet coke 2l/d  BP Readings from Last 3 Encounters:  01/21/13 160/94  10/03/12 158/92  09/24/12 172/106   Wt Readings from Last 3 Encounters:  01/21/13 197 lb (89.359 kg)  10/03/12 201 lb (91.173 kg)  09/24/12 201 lb (91.173 kg)      Review of Systems  Constitutional: Negative for appetite change and unexpected weight change.  HENT: Negative for nosebleeds, sneezing and trouble swallowing.   Eyes: Negative for itching and visual disturbance.  Cardiovascular: Negative for chest pain, palpitations and leg swelling.  Gastrointestinal: Negative for nausea, blood in stool and abdominal distention.  Genitourinary: Negative for frequency and hematuria.  Musculoskeletal: Negative for back pain, gait problem, joint swelling and neck pain.  Neurological: Negative for dizziness, tremors, speech difficulty and weakness.  Psychiatric/Behavioral: Negative for suicidal ideas, sleep disturbance, dysphoric mood and agitation. The patient is nervous/anxious (stressed).        Objective:   Physical Exam  Constitutional: He is oriented to person, place, and time. He appears well-developed.  obese  HENT:  Mouth/Throat: Oropharynx is clear and moist.  Eyes: Conjunctivae are normal. Pupils are equal, round, and reactive to light.  Neck: Normal range of motion. No JVD present. No thyromegaly present.  Cardiovascular: Normal rate, regular rhythm, normal heart sounds and intact distal pulses.  Exam reveals no gallop and no friction rub.   No murmur heard. Pulmonary/Chest: Effort normal and breath sounds normal. No respiratory distress. He has no wheezes. He has  no rales. He exhibits no tenderness.  Abdominal: Soft. Bowel sounds are normal. He exhibits no distension and no mass. There is no tenderness. There is no rebound and no guarding.  Musculoskeletal: Normal range of motion. He exhibits no edema and no tenderness.  Lymphadenopathy:    He has no cervical adenopathy.  Neurological: He is alert and oriented to person, place, and time. He has normal reflexes. No cranial nerve deficit. He exhibits normal muscle tone. Coordination normal.  Skin: Skin is warm and dry. No rash noted.  Psychiatric: He has a normal mood and affect. His behavior is normal. Judgment and thought content normal.  sad      Lab Results  Component Value Date   WBC 8.2 05/17/2012   HGB 13.7 05/17/2012   HCT 40.8 05/17/2012   PLT 155.0 05/17/2012   GLUCOSE 152* 01/15/2013   CHOL 157 05/17/2012   TRIG 220.0* 05/17/2012   HDL 37.20* 05/17/2012   LDLDIRECT 79.8 05/17/2012   LDLCALC 66 08/30/2011   ALT 21 05/17/2012   AST 19 05/17/2012   NA 138 01/15/2013   K 4.4 01/15/2013   CL 101 01/15/2013   CREATININE 1.7* 01/15/2013   BUN 19 01/15/2013   CO2 26 01/15/2013   TSH 2.48 05/17/2012   PSA 0.91 05/17/2012   HGBA1C 7.4* 01/15/2013   MICROALBUR 0.4 03/31/2010       Assessment & Plan:

## 2013-01-21 NOTE — Patient Instructions (Signed)
Wt Readings from Last 3 Encounters:  01/21/13 197 lb (89.359 kg)  10/03/12 201 lb (91.173 kg)  09/24/12 201 lb (91.173 kg)   BP Readings from Last 3 Encounters:  01/21/13 160/94  10/03/12 158/92  09/24/12 172/106

## 2013-01-21 NOTE — Assessment & Plan Note (Signed)
Start Proscar. 

## 2013-01-21 NOTE — Assessment & Plan Note (Signed)
A little better 

## 2013-03-28 ENCOUNTER — Other Ambulatory Visit: Payer: Self-pay | Admitting: Internal Medicine

## 2013-04-24 ENCOUNTER — Ambulatory Visit (INDEPENDENT_AMBULATORY_CARE_PROVIDER_SITE_OTHER)
Admission: RE | Admit: 2013-04-24 | Discharge: 2013-04-24 | Disposition: A | Discharge status: Home / Self Care | Payer: 59 | Source: Ambulatory Visit | Attending: Internal Medicine | Admitting: Internal Medicine

## 2013-04-24 ENCOUNTER — Encounter: Payer: Self-pay | Admitting: Internal Medicine

## 2013-04-24 ENCOUNTER — Ambulatory Visit (INDEPENDENT_AMBULATORY_CARE_PROVIDER_SITE_OTHER): Payer: 59 | Admitting: Internal Medicine

## 2013-04-24 VITALS — BP 158/102 | HR 72 | Temp 98.1°F | Resp 16 | Wt 202.0 lb

## 2013-04-24 DIAGNOSIS — M542 Cervicalgia: Secondary | ICD-10-CM

## 2013-04-24 MED ORDER — OXYCODONE HCL 10 MG PO TABS: 5.0000 mg | ORAL_TABLET | Freq: Four times a day (QID) | ORAL | Status: DC | PRN

## 2013-04-24 MED ORDER — DIAZEPAM 5 MG PO TABS: 2.5000 mg | ORAL_TABLET | Freq: Two times a day (BID) | ORAL | Status: DC | PRN

## 2013-04-24 MED ORDER — METHYLPREDNISOLONE ACETATE 80 MG/ML IJ SUSP
80.0000 mg | Freq: Once | INTRAMUSCULAR | Status: AC
  Administered 2013-04-24: 80 mg via INTRAMUSCULAR

## 2013-04-24 NOTE — Assessment & Plan Note (Signed)
Severe X ray ROM Depomedrol 80 mg IM Oxycodone Diazepam prn RTC 1 wk

## 2013-04-24 NOTE — Progress Notes (Signed)
Pre visit review using our clinic review tool, if applicable. No additional management support is needed unless otherwise documented below in the visit note. 

## 2013-04-24 NOTE — Progress Notes (Signed)
Patient ID: Douglas Richards, male   DOB: Dec 08, 1945, 68 y.o.   MRN: [id number]  Subjective:    Neck Pain  This is a new problem. The current episode started 1 to 4 weeks ago. The problem occurs constantly. The pain is present in the right side. The quality of the pain is described as aching and cramping. The pain is at a severity of 10/10. The pain is severe. The symptoms are aggravated by coughing and position. The pain is worse during the day. Pertinent negatives include no chest pain, headaches, trouble swallowing or weakness. He has tried acetaminophen and NSAIDs for the symptoms. The treatment provided no relief.    The patient presents for a follow-up of  chronic hypertension, chronic dyslipidemia, type 2 diabetes, gout controlled with medicines. He got laid off - no jobs. He is working on projects at home.  Stress w/stepson still cont on.Marland KitchenMarland KitchenHis dtr is sick w/migraines - much better after scalp decompression surgery - much better.. Diet coke 2l/d  BP Readings from Last 3 Encounters:  04/24/13 158/102  01/21/13 160/94  10/03/12 158/92   Wt Readings from Last 3 Encounters:  04/24/13 202 lb (91.627 kg)  01/21/13 197 lb (89.359 kg)  10/03/12 201 lb (91.173 kg)      Review of Systems  Constitutional: Negative for appetite change and unexpected weight change.  HENT: Negative for nosebleeds, sneezing and trouble swallowing.   Eyes: Negative for itching and visual disturbance.  Cardiovascular: Negative for chest pain, palpitations and leg swelling.  Gastrointestinal: Negative for nausea, blood in stool and abdominal distention.  Genitourinary: Negative for frequency and hematuria.  Musculoskeletal: Negative for back pain, gait problem, joint swelling and neck pain.  Neurological: Negative for dizziness, tremors, speech difficulty, weakness and headaches.  Psychiatric/Behavioral: Negative for suicidal ideas, sleep disturbance, dysphoric mood and agitation. The patient is nervous/anxious  (stressed).        Objective:   Physical Exam  Constitutional: He is oriented to person, place, and time. He appears well-developed.  obese  HENT:  Mouth/Throat: Oropharynx is clear and moist.  Eyes: Conjunctivae are normal. Pupils are equal, round, and reactive to light.  Neck: Normal range of motion. No JVD present. No thyromegaly present.  Cardiovascular: Normal rate, regular rhythm, normal heart sounds and intact distal pulses.  Exam reveals no gallop and no friction rub.   No murmur heard. Pulmonary/Chest: Effort normal and breath sounds normal. No respiratory distress. He has no wheezes. He has no rales. He exhibits no tenderness.  Abdominal: Soft. Bowel sounds are normal. He exhibits no distension and no mass. There is no tenderness. There is no rebound and no guarding.  Musculoskeletal: Normal range of motion. He exhibits no edema and no tenderness.  Lymphadenopathy:    He has no cervical adenopathy.  Neurological: He is alert and oriented to person, place, and time. He has normal reflexes. No cranial nerve deficit. He exhibits normal muscle tone. Coordination normal.  Skin: Skin is warm and dry. No rash noted.  Psychiatric: He has a normal mood and affect. His behavior is normal. Judgment and thought content normal.  sad      Lab Results  Component Value Date   WBC 8.2 05/17/2012   HGB 13.7 05/17/2012   HCT 40.8 05/17/2012   PLT 155.0 05/17/2012   GLUCOSE 152* 01/15/2013   CHOL 157 05/17/2012   TRIG 220.0* 05/17/2012   HDL 37.20* 05/17/2012   LDLDIRECT 79.8 05/17/2012   LDLCALC 66 08/30/2011   ALT 21  05/17/2012   AST 19 05/17/2012   NA 138 01/15/2013   K 4.4 01/15/2013   CL 101 01/15/2013   CREATININE 1.7* 01/15/2013   BUN 19 01/15/2013   CO2 26 01/15/2013   TSH 2.48 05/17/2012   PSA 0.91 05/17/2012   HGBA1C 7.4* 01/15/2013   MICROALBUR 0.4 03/31/2010       Assessment & Plan:

## 2013-05-01 ENCOUNTER — Ambulatory Visit (INDEPENDENT_AMBULATORY_CARE_PROVIDER_SITE_OTHER): Payer: 59 | Admitting: Internal Medicine

## 2013-05-01 ENCOUNTER — Encounter: Payer: Self-pay | Admitting: Internal Medicine

## 2013-05-01 VITALS — BP 162/96 | HR 76 | Temp 98.2°F | Resp 16 | Wt 202.0 lb

## 2013-05-01 DIAGNOSIS — M542 Cervicalgia: Secondary | ICD-10-CM

## 2013-05-01 NOTE — Progress Notes (Signed)
Subjective:    Neck Pain  This is a new problem. The current episode started 1 to 4 weeks ago. The problem occurs constantly. The pain is present in the right side. The quality of the pain is described as aching and cramping. The pain is at a severity of 10/10. The pain is severe. The symptoms are aggravated by coughing and position. The pain is worse during the day. Pertinent negatives include no chest pain, headaches, trouble swallowing or weakness. He has tried acetaminophen and NSAIDs for the symptoms. The treatment provided no relief.    The patient presents for a follow-up of  chronic hypertension, chronic dyslipidemia, type 2 diabetes, gout controlled with medicines. He got laid off - no jobs. He is working on projects at home.  Stress w/stepson still cont on.Marland KitchenMarland KitchenHis dtr is sick w/migraines - much better after scalp decompression surgery - much better.. Diet coke 2l/d  BP Readings from Last 3 Encounters:  05/01/13 162/96  04/24/13 158/102  01/21/13 160/94   Wt Readings from Last 3 Encounters:  05/01/13 202 lb (91.627 kg)  04/24/13 202 lb (91.627 kg)  01/21/13 197 lb (89.359 kg)      Review of Systems  Constitutional: Negative for appetite change and unexpected weight change.  HENT: Negative for nosebleeds, sneezing and trouble swallowing.   Eyes: Negative for itching and visual disturbance.  Cardiovascular: Negative for chest pain, palpitations and leg swelling.  Gastrointestinal: Negative for nausea, blood in stool and abdominal distention.  Genitourinary: Negative for frequency and hematuria.  Musculoskeletal: Negative for back pain, gait problem, joint swelling and neck pain.  Neurological: Negative for dizziness, tremors, speech difficulty, weakness and headaches.  Psychiatric/Behavioral: Negative for suicidal ideas, sleep disturbance, dysphoric mood and agitation. The patient is nervous/anxious (stressed).        Objective:   Physical Exam  Constitutional: He is  oriented to person, place, and time. He appears well-developed.  obese  HENT:  Mouth/Throat: Oropharynx is clear and moist.  Eyes: Conjunctivae are normal. Pupils are equal, round, and reactive to light.  Neck: Normal range of motion. No JVD present. No thyromegaly present.  Cardiovascular: Normal rate, regular rhythm, normal heart sounds and intact distal pulses.  Exam reveals no gallop and no friction rub.   No murmur heard. Pulmonary/Chest: Effort normal and breath sounds normal. No respiratory distress. He has no wheezes. He has no rales. He exhibits no tenderness.  Abdominal: Soft. Bowel sounds are normal. He exhibits no distension and no mass. There is no tenderness. There is no rebound and no guarding.  Musculoskeletal: Normal range of motion. He exhibits no edema and no tenderness.  Lymphadenopathy:    He has no cervical adenopathy.  Neurological: He is alert and oriented to person, place, and time. He has normal reflexes. No cranial nerve deficit. He exhibits normal muscle tone. Coordination normal.  Skin: Skin is warm and dry. No rash noted.  Psychiatric: He has a normal mood and affect. His behavior is normal. Judgment and thought content normal.  sad      Lab Results  Component Value Date   WBC 8.2 05/17/2012   HGB 13.7 05/17/2012   HCT 40.8 05/17/2012   PLT 155.0 05/17/2012   GLUCOSE 152* 01/15/2013   CHOL 157 05/17/2012   TRIG 220.0* 05/17/2012   HDL 37.20* 05/17/2012   LDLDIRECT 79.8 05/17/2012   LDLCALC 66 08/30/2011   ALT 21 05/17/2012   AST 19 05/17/2012   NA 138 01/15/2013   K 4.4 01/15/2013  CL 101 01/15/2013   CREATININE 1.7* 01/15/2013   BUN 19 01/15/2013   CO2 26 01/15/2013   TSH 2.48 05/17/2012   PSA 0.91 05/17/2012   HGBA1C 7.4* 01/15/2013   MICROALBUR 0.4 03/31/2010       Assessment & Plan:

## 2013-05-01 NOTE — Assessment & Plan Note (Signed)
80% better Xray discussed RTC if not well

## 2013-05-01 NOTE — Progress Notes (Signed)
Pre visit review using our clinic review tool, if applicable. No additional management support is needed unless otherwise documented below in the visit note. 

## 2013-05-28 ENCOUNTER — Ambulatory Visit: Payer: 59 | Admitting: Internal Medicine

## 2013-06-18 ENCOUNTER — Other Ambulatory Visit: Payer: Self-pay | Admitting: Internal Medicine

## 2013-07-09 ENCOUNTER — Ambulatory Visit: Payer: 59 | Admitting: Internal Medicine

## 2013-07-23 ENCOUNTER — Other Ambulatory Visit: Payer: Self-pay | Admitting: Internal Medicine

## 2013-07-28 ENCOUNTER — Other Ambulatory Visit: Payer: Self-pay | Admitting: Internal Medicine

## 2013-08-11 ENCOUNTER — Telehealth: Payer: Self-pay

## 2013-08-11 DIAGNOSIS — E119 Type 2 diabetes mellitus without complications: Secondary | ICD-10-CM

## 2013-08-11 NOTE — Telephone Encounter (Signed)
Diabetic bundle- lipid, bmet and a1c ordered 

## 2013-08-13 ENCOUNTER — Ambulatory Visit: Payer: 59 | Admitting: Internal Medicine

## 2013-08-20 ENCOUNTER — Encounter: Payer: Self-pay | Admitting: Internal Medicine

## 2013-08-26 ENCOUNTER — Ambulatory Visit (INDEPENDENT_AMBULATORY_CARE_PROVIDER_SITE_OTHER): Payer: 59 | Admitting: Internal Medicine

## 2013-08-26 ENCOUNTER — Encounter: Payer: Self-pay | Admitting: Internal Medicine

## 2013-08-26 VITALS — BP 160/100 | HR 80 | Temp 98.2°F | Wt 202.0 lb

## 2013-08-26 DIAGNOSIS — I10 Essential (primary) hypertension: Secondary | ICD-10-CM

## 2013-08-26 DIAGNOSIS — K2100 Gastro-esophageal reflux disease with esophagitis, without bleeding: Secondary | ICD-10-CM

## 2013-08-26 MED ORDER — OMEPRAZOLE 20 MG PO CPDR: 20.0000 mg | DELAYED_RELEASE_CAPSULE | Freq: Every day | ORAL | Status: DC

## 2013-08-26 NOTE — Patient Instructions (Signed)
Reflux of gastric acid may be asymptomatic as this may occur mainly during sleep.The triggers for reflux  include stress; the "aspirin family" ; alcohol; peppermint; and caffeine (coffee, tea, cola, and chocolate). The aspirin family would include aspirin and the nonsteroidal agents such as ibuprofen &  Naproxen. Tylenol would not cause reflux. If having symptoms ; food & drink should be avoided for @ least 2 hours before going to bed. Take the protein pump inhibitor, Omeprazole 30 minutes before breakfast and 30 minutes before the evening meal . See Dr Alain Marion in 3 weeks .

## 2013-08-26 NOTE — Progress Notes (Signed)
Pre visit review using our clinic review tool, if applicable. No additional management support is needed unless otherwise documented below in the visit note. 

## 2013-08-26 NOTE — Progress Notes (Signed)
   Subjective:    Patient ID: Douglas Richards, male    DOB: 11-14-1945, 68 y.o.   MRN: [id number]  HPI  He has had the sensation of a lump in his throat since 08/18/13  He has had a cough with clear secretions. He also has some sensation of sore throat.  He describes mild chest congestion  Gargling with salt water provides partial benefit.  He has had some pain in his front teeth & itching in the right ear.   He is taking TUMS without benefit. He feels as if he has some discomfort in the lower esophagus with swallowing.   Review of Systems  He denies frontal headache, facial pain, nasal purulence, significant postnasal drainage , itchy, watery eyes, sneezing.       Objective:   Physical Exam   Significant or distinguishing  findings on physical exam are documented first.  Below that are other systems examined & findings.  Repeat BP 160/98. Slight hoarseness. There is some scarring of the left tympanic membrane. Some lateral curvature of the nose.  General appearance:good health ;well nourished; no acute distress or increased work of breathing is present.  No  lymphadenopathy about the head, neck, or axilla noted.  Eyes: No conjunctival inflammation or lid edema is present. There is no scleral icterus. Ears:  External ear exam shows no significant lesions or deformities.  Otoscopic examination reveals clear canals, tympanic membranes are intact bilaterally without bulging, retraction, inflammation or discharge (seeabove). Nose:  Nasal mucosa are pink and moist without lesions or exudates. No septal dislocation or deviation.No obstruction to airflow.  Oral exam: Dental hygiene is good; lips and gums are healthy appearing.There is no oropharyngeal erythema or exudate noted.  Neck:  No deformities, thyromegaly, masses, or tenderness noted.   Supple with full range of motion without pain.  Heart:  Normal rate and regular rhythm. S1 and S2 normal without gallop, murmur, click, rub or  other extra sounds.  Lungs:Chest clear to auscultation; no wheezes, rhonchi,rales ,or rubs present.No increased work of breathing.   Extremities:  No cyanosis, edema, or clubbing  noted  Skin: Warm & dry w/o jaundice or tenting.        Assessment & Plan:  #1 laryngopharyngeal reflux  #2 hypertension; he states he has a major component of white coat syndrome. He has been asked to bring his blood pressure cuff to office visits to verify this. See after visit summary and orders

## 2013-09-10 ENCOUNTER — Other Ambulatory Visit (INDEPENDENT_AMBULATORY_CARE_PROVIDER_SITE_OTHER): Payer: 59

## 2013-09-10 DIAGNOSIS — E119 Type 2 diabetes mellitus without complications: Secondary | ICD-10-CM

## 2013-09-10 LAB — BASIC METABOLIC PANEL
BUN: 23 mg/dL (ref 6–23)
CHLORIDE: 101 meq/L (ref 96–112)
CO2: 21 mEq/L (ref 19–32)
CREATININE: 1.6 mg/dL — AB (ref 0.4–1.5)
Calcium: 9.6 mg/dL (ref 8.4–10.5)
GFR: 46.26 mL/min — ABNORMAL LOW (ref >60.00)
Glucose, Bld: 157 mg/dL — ABNORMAL HIGH (ref 70–99)
Potassium: 4.3 mEq/L (ref 3.5–5.1)
Sodium: 135 mEq/L (ref 135–145)

## 2013-09-10 LAB — LIPID PANEL
CHOL/HDL RATIO: 5
CHOLESTEROL: 184 mg/dL (ref 0–200)
HDL: 36.7 mg/dL — AB (ref >39.00)
NonHDL: 147.3
TRIGLYCERIDES: 321 mg/dL — AB (ref 0.0–149.0)
VLDL: 64.2 mg/dL — ABNORMAL HIGH (ref 0.0–40.0)

## 2013-09-10 LAB — LDL CHOLESTEROL, DIRECT: Direct LDL: 105.5 mg/dL

## 2013-09-10 LAB — HEMOGLOBIN A1C: HEMOGLOBIN A1C: 7.5 % — AB (ref 4.6–6.5)

## 2013-09-17 ENCOUNTER — Ambulatory Visit (INDEPENDENT_AMBULATORY_CARE_PROVIDER_SITE_OTHER): Payer: 59 | Admitting: Internal Medicine

## 2013-09-17 ENCOUNTER — Telehealth: Payer: Self-pay | Admitting: Internal Medicine

## 2013-09-17 ENCOUNTER — Encounter: Payer: Self-pay | Admitting: Internal Medicine

## 2013-09-17 VITALS — BP 152/82 | HR 98 | Temp 98.3°F | Wt 198.0 lb

## 2013-09-17 DIAGNOSIS — E119 Type 2 diabetes mellitus without complications: Secondary | ICD-10-CM

## 2013-09-17 DIAGNOSIS — F4320 Adjustment disorder, unspecified: Secondary | ICD-10-CM | POA: Insufficient documentation

## 2013-09-17 DIAGNOSIS — F4321 Adjustment disorder with depressed mood: Secondary | ICD-10-CM

## 2013-09-17 DIAGNOSIS — R21 Rash and other nonspecific skin eruption: Secondary | ICD-10-CM

## 2013-09-17 DIAGNOSIS — I10 Essential (primary) hypertension: Secondary | ICD-10-CM

## 2013-09-17 MED ORDER — DIAZEPAM 5 MG PO TABS: 2.5000 mg | ORAL_TABLET | Freq: Two times a day (BID) | ORAL | Status: DC | PRN

## 2013-09-17 MED ORDER — ESOMEPRAZOLE MAGNESIUM 40 MG PO CPDR: 40.0000 mg | DELAYED_RELEASE_CAPSULE | Freq: Every day | ORAL | Status: DC

## 2013-09-17 MED ORDER — TRIAMCINOLONE ACETONIDE 0.1 % EX CREA: 1.0000 "application " | TOPICAL_CREAM | Freq: Two times a day (BID) | CUTANEOUS | Status: DC

## 2013-09-17 NOTE — Assessment & Plan Note (Signed)
7/15 - Brother just died w/lupus and CV issues Discussed

## 2013-09-17 NOTE — Progress Notes (Addendum)
Subjective:    Neck Pain  This is a recurrent problem. The current episode started more than 1 month ago. The problem occurs constantly. The problem has been waxing and waning. The pain is present in the right side. The quality of the pain is described as aching and cramping. The pain is at a severity of 10/10. The pain is mild. The symptoms are aggravated by coughing and position. The pain is worse during the day. Pertinent negatives include no chest pain, headaches, trouble swallowing or weakness. He has tried NSAIDs for the symptoms. The treatment provided moderate relief.   09/10/22 - Brother just died w/lupus and CV issues  The patient presents for a follow-up of  chronic hypertension, chronic dyslipidemia, type 2 diabetes, gout controlled with medicines. He got laid off - no jobs. He is working on projects at home.  Stress w/stepson still cont on.Marland KitchenMarland KitchenHis dtr is sick w/migraines - much better after scalp decompression surgery - much better.. Diet coke 2l/d  BP Readings from Last 3 Encounters:  09/17/13 152/82  08/26/13 160/100  05/01/13 162/96   Wt Readings from Last 3 Encounters:  09/17/13 198 lb (89.812 kg)  08/26/13 202 lb (91.627 kg)  05/01/13 202 lb (91.627 kg)      Review of Systems  Constitutional: Negative for appetite change and unexpected weight change.  HENT: Negative for nosebleeds, sneezing and trouble swallowing.   Eyes: Negative for itching and visual disturbance.  Cardiovascular: Negative for chest pain, palpitations and leg swelling.  Gastrointestinal: Negative for nausea, blood in stool and abdominal distention.  Genitourinary: Negative for frequency and hematuria.  Musculoskeletal: Negative for back pain, gait problem, joint swelling and neck pain.  Neurological: Negative for dizziness, tremors, speech difficulty, weakness and headaches.  Psychiatric/Behavioral: Negative for suicidal ideas, sleep disturbance, dysphoric mood and agitation. The patient is  nervous/anxious (stressed).        Objective:   Physical Exam  Constitutional: He is oriented to person, place, and time. He appears well-developed.  obese  HENT:  Mouth/Throat: Oropharynx is clear and moist.  Eyes: Conjunctivae are normal. Pupils are equal, round, and reactive to light.  Neck: Normal range of motion. No JVD present. No thyromegaly present.  Cardiovascular: Normal rate, regular rhythm, normal heart sounds and intact distal pulses.  Exam reveals no gallop and no friction rub.   No murmur heard. Pulmonary/Chest: Effort normal and breath sounds normal. No respiratory distress. He has no wheezes. He has no rales. He exhibits no tenderness.  Abdominal: Soft. Bowel sounds are normal. He exhibits no distension and no mass. There is no tenderness. There is no rebound and no guarding.  Musculoskeletal: Normal range of motion. He exhibits no edema and no tenderness.  Lymphadenopathy:    He has no cervical adenopathy.  Neurological: He is alert and oriented to person, place, and time. He has normal reflexes. No cranial nerve deficit. He exhibits normal muscle tone. Coordination normal.  Skin: Skin is warm and dry. No rash noted.  Psychiatric: He has a normal mood and affect. His behavior is normal. Judgment and thought content normal.  sad  rash - papules    Lab Results  Component Value Date   WBC 8.2 05/17/2012   HGB 13.7 05/17/2012   HCT 40.8 05/17/2012   PLT 155.0 05/17/2012   GLUCOSE 157* 09/10/2013   CHOL 184 09/10/2013   TRIG 321.0* 09/10/2013   HDL 36.70* 09/10/2013   LDLDIRECT 105.5 09/10/2013   LDLCALC 66 08/30/2011   ALT 21  05/17/2012   AST 19 05/17/2012   NA 135 09/10/2013   K 4.3 09/10/2013   CL 101 09/10/2013   CREATININE 1.6* 09/10/2013   BUN 23 09/10/2013   CO2 21 09/10/2013   TSH 2.48 05/17/2012   PSA 0.91 05/17/2012   HGBA1C 7.5* 09/10/2013   MICROALBUR 0.4 03/31/2010       Assessment & Plan:

## 2013-09-17 NOTE — Assessment & Plan Note (Signed)
Worse See Rx 

## 2013-09-17 NOTE — Assessment & Plan Note (Signed)
Worse 

## 2013-09-17 NOTE — Assessment & Plan Note (Signed)
Triamc cream 

## 2013-09-17 NOTE — Addendum Note (Signed)
Addended by: Cassandria Anger on: 09/17/2013 10:00 AM   Modules accepted: Orders, Medications

## 2013-09-17 NOTE — Telephone Encounter (Signed)
Relevant patient education assigned to patient using Emmi. ° °

## 2013-09-17 NOTE — Progress Notes (Signed)
Pre visit review using our clinic review tool, if applicable. No additional management support is needed unless otherwise documented below in the visit note. 

## 2013-10-28 ENCOUNTER — Ambulatory Visit (INDEPENDENT_AMBULATORY_CARE_PROVIDER_SITE_OTHER): Payer: 59 | Admitting: *Deleted

## 2013-10-28 DIAGNOSIS — Z23 Encounter for immunization: Secondary | ICD-10-CM

## 2013-11-14 ENCOUNTER — Telehealth: Payer: Self-pay | Admitting: Internal Medicine

## 2013-11-14 NOTE — Telephone Encounter (Signed)
Patients spouse is applying on OCT 15 for [employer] Medicare Supplement through [facility].  Insurance has requested Dr. Alain Marion to add kombiglyze 2.5 mg- 1000mg  and esometrazole mag Dr 40 mgc to the formulary plan.  Patient is requesting an urgent rush on this to apply for insurance.

## 2013-11-17 ENCOUNTER — Telehealth: Payer: Self-pay | Admitting: Internal Medicine

## 2013-11-17 ENCOUNTER — Encounter: Payer: Self-pay | Admitting: Internal Medicine

## 2013-11-17 DIAGNOSIS — R0989 Other specified symptoms and signs involving the circulatory and respiratory systems: Secondary | ICD-10-CM

## 2013-11-17 NOTE — Telephone Encounter (Signed)
Called pt to verify msg below. Wife stated they will be applying for the new [employer] advantage insurance with [employer] on 11/20/13. The two meds are not on the formulary and need md to have medication added. Explain to wife after she is approved with the new insurance md will have to send new rx's to their pharmacy which he would probably need a PA. Once pharmacy fax md the PA information his assistant will proceed with the PA once approved the pharmacy will be notified, but until he get new insurance we can not do anything at this time...Johny Chess

## 2013-11-17 NOTE — Telephone Encounter (Signed)
Pt called stated Dr. Camila Li recommend that Douglas Richards to get the electrocardiogram, to check the neck artery to see if there is a blockage. Please advise, pt is calling back to see if Dr. Camila Li can put in an order for this.

## 2013-11-25 NOTE — Telephone Encounter (Signed)
EKG was done in 2014 - nl I'll order carotid US Thx

## 2013-11-26 ENCOUNTER — Telehealth: Payer: Self-pay | Admitting: Internal Medicine

## 2013-11-26 MED ORDER — ESOMEPRAZOLE MAGNESIUM 40 MG PO CPDR: 40.0000 mg | DELAYED_RELEASE_CAPSULE | Freq: Every day | ORAL | Status: DC

## 2013-11-26 MED ORDER — KOMBIGLYZE XR 2.5-1000 MG PO TB24: ORAL_TABLET | ORAL | Status: DC

## 2013-11-26 NOTE — Telephone Encounter (Signed)
Sent rx to pharmacy once they send fax with PA information office will proceed with getting med approved...Douglas Richards

## 2013-11-26 NOTE — Telephone Encounter (Signed)
Have already signed up for health team advantage and was approved.  Now needs Kombiglyze and esometrazole added to formulary.  See phone note for 10/9.

## 2013-12-02 ENCOUNTER — Other Ambulatory Visit: Payer: Self-pay | Admitting: *Deleted

## 2013-12-02 DIAGNOSIS — R0989 Other specified symptoms and signs involving the circulatory and respiratory systems: Secondary | ICD-10-CM

## 2013-12-09 ENCOUNTER — Ambulatory Visit (HOSPITAL_COMMUNITY): Payer: 59 | Attending: Cardiology | Admitting: *Deleted

## 2013-12-09 DIAGNOSIS — I10 Essential (primary) hypertension: Secondary | ICD-10-CM | POA: Insufficient documentation

## 2013-12-09 DIAGNOSIS — E785 Hyperlipidemia, unspecified: Secondary | ICD-10-CM | POA: Diagnosis not present

## 2013-12-09 DIAGNOSIS — R0989 Other specified symptoms and signs involving the circulatory and respiratory systems: Secondary | ICD-10-CM | POA: Insufficient documentation

## 2013-12-09 DIAGNOSIS — E119 Type 2 diabetes mellitus without complications: Secondary | ICD-10-CM | POA: Diagnosis not present

## 2013-12-09 DIAGNOSIS — I6523 Occlusion and stenosis of bilateral carotid arteries: Secondary | ICD-10-CM

## 2013-12-09 NOTE — Progress Notes (Signed)
Carotid Duplex Performed 

## 2013-12-22 ENCOUNTER — Telehealth: Payer: Self-pay | Admitting: Internal Medicine

## 2013-12-22 NOTE — Telephone Encounter (Signed)
Patient is requesting 3 month script for all meds in December.  Patient is trying to get on to new [employer] insurance and they are having issues with coverage.  [employer] has advised patient to get a 3 month supply on all meds in December to get them through until issues can be resolved.

## 2013-12-22 NOTE — Telephone Encounter (Signed)
oThx

## 2013-12-25 ENCOUNTER — Ambulatory Visit (AMBULATORY_SURGERY_CENTER): Payer: Self-pay | Admitting: *Deleted

## 2013-12-25 VITALS — Ht 68.0 in | Wt 200.8 lb

## 2013-12-25 DIAGNOSIS — Z86010 Personal history of colonic polyps: Secondary | ICD-10-CM

## 2013-12-25 MED ORDER — MOVIPREP 100 G PO SOLR: 1.0000 | Freq: Once | ORAL | Status: DC

## 2013-12-25 NOTE — Progress Notes (Signed)
No egg or soy allergy. ewm No blood thinners, no diet pills. ewm No home 02 use. ewm No issues with past sedation. ewm Pt declined emmi. ewm

## 2013-12-29 MED ORDER — ALLOPURINOL 300 MG PO TABS
300.0000 mg | ORAL_TABLET | Freq: Every day | ORAL | Status: DC
Start: 2013-12-29 — End: 2014-01-13

## 2013-12-29 MED ORDER — ESOMEPRAZOLE MAGNESIUM 40 MG PO CPDR: 40.0000 mg | DELAYED_RELEASE_CAPSULE | Freq: Every day | ORAL | Status: DC

## 2013-12-29 MED ORDER — KOMBIGLYZE XR 2.5-1000 MG PO TB24: ORAL_TABLET | ORAL | Status: DC

## 2013-12-29 MED ORDER — TRANDOLAPRIL 4 MG PO TABS: 4.0000 mg | ORAL_TABLET | Freq: Every day | ORAL | Status: DC

## 2013-12-29 MED ORDER — ROSUVASTATIN CALCIUM 10 MG PO TABS: 10.0000 mg | ORAL_TABLET | Freq: Every day | ORAL | Status: DC

## 2013-12-29 MED ORDER — AMLODIPINE BESYLATE 5 MG PO TABS: 5.0000 mg | ORAL_TABLET | Freq: Every day | ORAL | Status: DC

## 2013-12-29 NOTE — Telephone Encounter (Signed)
Rxs printed/up front for p/u. Pt informed  

## 2014-01-06 ENCOUNTER — Other Ambulatory Visit (INDEPENDENT_AMBULATORY_CARE_PROVIDER_SITE_OTHER): Payer: 59

## 2014-01-06 DIAGNOSIS — E119 Type 2 diabetes mellitus without complications: Secondary | ICD-10-CM

## 2014-01-06 LAB — BASIC METABOLIC PANEL
BUN: 22 mg/dL (ref 6–23)
CALCIUM: 9.6 mg/dL (ref 8.4–10.5)
CO2: 25 mEq/L (ref 19–32)
Chloride: 100 mEq/L (ref 96–112)
Creatinine, Ser: 1.6 mg/dL — ABNORMAL HIGH (ref 0.4–1.5)
GFR: 47.59 mL/min — ABNORMAL LOW (ref >60.00)
GLUCOSE: 135 mg/dL — AB (ref 70–99)
Potassium: 4.7 mEq/L (ref 3.5–5.1)
Sodium: 135 mEq/L (ref 135–145)

## 2014-01-06 LAB — HEMOGLOBIN A1C: HEMOGLOBIN A1C: 7.4 % — AB (ref 4.6–6.5)

## 2014-01-13 ENCOUNTER — Ambulatory Visit (INDEPENDENT_AMBULATORY_CARE_PROVIDER_SITE_OTHER): Payer: 59 | Admitting: Internal Medicine

## 2014-01-13 ENCOUNTER — Encounter: Payer: Self-pay | Admitting: Internal Medicine

## 2014-01-13 VITALS — BP 138/82 | HR 82 | Temp 98.1°F | Resp 16 | Ht 68.0 in | Wt 196.0 lb

## 2014-01-13 DIAGNOSIS — E118 Type 2 diabetes mellitus with unspecified complications: Secondary | ICD-10-CM

## 2014-01-13 DIAGNOSIS — Z23 Encounter for immunization: Secondary | ICD-10-CM

## 2014-01-13 DIAGNOSIS — I6523 Occlusion and stenosis of bilateral carotid arteries: Secondary | ICD-10-CM

## 2014-01-13 DIAGNOSIS — M542 Cervicalgia: Secondary | ICD-10-CM

## 2014-01-13 MED ORDER — KOMBIGLYZE XR 2.5-1000 MG PO TB24: ORAL_TABLET | ORAL | Status: DC

## 2014-01-13 MED ORDER — ESOMEPRAZOLE MAGNESIUM 40 MG PO CPDR: 40.0000 mg | DELAYED_RELEASE_CAPSULE | Freq: Every day | ORAL | Status: DC

## 2014-01-13 MED ORDER — AMLODIPINE BESYLATE 5 MG PO TABS: 5.0000 mg | ORAL_TABLET | Freq: Every day | ORAL | Status: DC

## 2014-01-13 MED ORDER — TRANDOLAPRIL 4 MG PO TABS: 4.0000 mg | ORAL_TABLET | Freq: Every day | ORAL | Status: DC

## 2014-01-13 MED ORDER — ALLOPURINOL 300 MG PO TABS: 300.0000 mg | ORAL_TABLET | Freq: Every day | ORAL | Status: DC

## 2014-01-13 MED ORDER — ROSUVASTATIN CALCIUM 10 MG PO TABS: 10.0000 mg | ORAL_TABLET | Freq: Every day | ORAL | Status: DC

## 2014-01-13 NOTE — Progress Notes (Signed)
Pre visit review using our clinic review tool, if applicable. No additional management support is needed unless otherwise documented below in the visit note. 

## 2014-01-13 NOTE — Assessment & Plan Note (Signed)
Continue with current prescription therapy as reflected on the Med list.  

## 2014-01-13 NOTE — Assessment & Plan Note (Signed)
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

## 2014-01-13 NOTE — Patient Instructions (Signed)
Low carb diet 

## 2014-01-13 NOTE — Progress Notes (Signed)
Subjective:    Neck Pain  This is a chronic problem. The problem has been waxing and waning. The pain is at a severity of 4/10. The pain is moderate. Pertinent negatives include no chest pain, headaches, trouble swallowing or weakness.   09-06-2022 - Brother just died w/lupus and CV issues  The patient presents for a follow-up of  chronic hypertension, chronic dyslipidemia, type 2 diabetes, gout controlled with medicines. He got laid off - no jobs. He is working on projects at home.  Stress w/stepson still cont on.Marland KitchenMarland KitchenHis dtr is sick w/migraines - much better after scalp decompression surgery - much better.. Diet coke 2l/d  BP Readings from Last 3 Encounters:  01/13/14 138/82  09/17/13 152/82  08/26/13 160/100   Wt Readings from Last 3 Encounters:  01/13/14 196 lb (88.905 kg)  12/25/13 200 lb 12.8 oz (91.082 kg)  09/17/13 198 lb (89.812 kg)      Review of Systems  Constitutional: Negative for appetite change and unexpected weight change.  HENT: Negative for nosebleeds, sneezing and trouble swallowing.   Eyes: Negative for itching and visual disturbance.  Cardiovascular: Negative for chest pain, palpitations and leg swelling.  Gastrointestinal: Negative for nausea, blood in stool and abdominal distention.  Genitourinary: Negative for frequency and hematuria.  Musculoskeletal: Negative for back pain, joint swelling, gait problem and neck pain.  Neurological: Negative for dizziness, tremors, speech difficulty, weakness and headaches.  Psychiatric/Behavioral: Negative for suicidal ideas, sleep disturbance, dysphoric mood and agitation. The patient is nervous/anxious (stressed).        Objective:   Physical Exam  Constitutional: He is oriented to person, place, and time. He appears well-developed. No distress.  NAD  HENT:  Mouth/Throat: Oropharynx is clear and moist.  Eyes: Conjunctivae are normal. Pupils are equal, round, and reactive to light.  Neck: Normal range of motion. No  JVD present. No thyromegaly present.  Cardiovascular: Normal rate, regular rhythm, normal heart sounds and intact distal pulses.  Exam reveals no gallop and no friction rub.   No murmur heard. Pulmonary/Chest: Effort normal and breath sounds normal. No respiratory distress. He has no wheezes. He has no rales. He exhibits no tenderness.  Abdominal: Soft. Bowel sounds are normal. He exhibits no distension and no mass. There is no tenderness. There is no rebound and no guarding.  Musculoskeletal: Normal range of motion. He exhibits no edema or tenderness.  Lymphadenopathy:    He has no cervical adenopathy.  Neurological: He is alert and oriented to person, place, and time. He has normal reflexes. No cranial nerve deficit. He exhibits normal muscle tone. He displays a negative Romberg sign. Coordination and gait normal.  No meningeal signs  Skin: Skin is warm and dry. No rash noted.  Psychiatric: He has a normal mood and affect. His behavior is normal. Judgment and thought content normal.  neck - tender w/ROM    Lab Results  Component Value Date   WBC 8.2 05/17/2012   HGB 13.7 05/17/2012   HCT 40.8 05/17/2012   PLT 155.0 05/17/2012   GLUCOSE 135* 01/06/2014   CHOL 184 09/10/2013   TRIG 321.0* 09/10/2013   HDL 36.70* 09/10/2013   LDLDIRECT 105.5 09/10/2013   LDLCALC 66 08/30/2011   ALT 21 05/17/2012   AST 19 05/17/2012   NA 135 01/06/2014   K 4.7 01/06/2014   CL 100 01/06/2014   CREATININE 1.6* 01/06/2014   BUN 22 01/06/2014   CO2 25 01/06/2014   TSH 2.48 05/17/2012   PSA 0.91  05/17/2012   HGBA1C 7.4* 01/06/2014   MICROALBUR 0.4 03/31/2010       Assessment & Plan:  Patient ID: Douglas Richards, male   DOB: 1945/05/30, 68 y.o.   MRN: [id number]

## 2014-01-15 ENCOUNTER — Ambulatory Visit (AMBULATORY_SURGERY_CENTER): Payer: 59 | Admitting: Internal Medicine

## 2014-01-15 ENCOUNTER — Encounter: Payer: Self-pay | Admitting: Internal Medicine

## 2014-01-15 VITALS — BP 115/76 | HR 71 | Temp 97.1°F | Resp 18 | Ht 68.0 in | Wt 200.0 lb

## 2014-01-15 DIAGNOSIS — K635 Polyp of colon: Secondary | ICD-10-CM

## 2014-01-15 DIAGNOSIS — Z86010 Personal history of colonic polyps: Secondary | ICD-10-CM

## 2014-01-15 DIAGNOSIS — D122 Benign neoplasm of ascending colon: Secondary | ICD-10-CM

## 2014-01-15 HISTORY — PX: COLONOSCOPY: SHX174

## 2014-01-15 LAB — GLUCOSE, CAPILLARY
GLUCOSE-CAPILLARY: 159 mg/dL — AB (ref 70–99)
Glucose-Capillary: 132 mg/dL — ABNORMAL HIGH (ref 70–99)

## 2014-01-15 MED ORDER — SODIUM CHLORIDE 0.9 % IV SOLN: 500.0000 mL | INTRAVENOUS | Status: DC

## 2014-01-15 NOTE — Progress Notes (Signed)
Called to room to assist during endoscopic procedure.  Patient ID and intended procedure confirmed with present staff. Received instructions for my participation in the procedure from the performing physician.  

## 2014-01-15 NOTE — Patient Instructions (Signed)
YOU HAD AN ENDOSCOPIC PROCEDURE TODAY AT THE [facility]: Refer to the procedure report that was given to you for any specific questions about what was found during the examination.  If the procedure report does not answer your questions, please call your gastroenterologist to clarify.  If you requested that your care partner not be given the details of your procedure findings, then the procedure report has been included in a sealed envelope for you to review at your convenience later.  YOU SHOULD EXPECT: Some feelings of bloating in the abdomen. Passage of more gas than usual.  Walking can help get rid of the air that was put into your GI tract during the procedure and reduce the bloating. If you had a lower endoscopy (such as a colonoscopy or flexible sigmoidoscopy) you may notice spotting of blood in your stool or on the toilet paper. If you underwent a bowel prep for your procedure, then you may not have a normal bowel movement for a few days.  DIET: Your first meal following the procedure should be a light meal and then it is ok to progress to your normal diet.  A half-sandwich or bowl of soup is an example of a good first meal.  Heavy or fried foods are harder to digest and may make you feel nauseous or bloated.  Likewise meals heavy in dairy and vegetables can cause extra gas to form and this can also increase the bloating.  Drink plenty of fluids but you should avoid alcoholic beverages for 24 hours.  ACTIVITY: Your care partner should take you home directly after the procedure.  You should plan to take it easy, moving slowly for the rest of the day.  You can resume normal activity the day after the procedure however you should NOT DRIVE or use heavy machinery for 24 hours (because of the sedation medicines used during the test).    SYMPTOMS TO REPORT IMMEDIATELY: A gastroenterologist can be reached at any hour.  During normal business hours, 8:30 AM to 5:00 PM Monday through Friday,  call [phone].  After hours and on weekends, please call the GI answering service at [phone] who will take a message and have the physician on call contact you.   Following lower endoscopy (colonoscopy or flexible sigmoidoscopy):  Excessive amounts of blood in the stool  Significant tenderness or worsening of abdominal pains  Swelling of the abdomen that is new, acute  Fever of 100F or higher    FOLLOW UP: If any biopsies were taken you will be contacted by phone or by letter within the next 1-3 weeks.  Call your gastroenterologist if you have not heard about the biopsies in 3 weeks.  Our staff will call the home number listed on your records the next business day following your procedure to check on you and address any questions or concerns that you may have at that time regarding the information given to you following your procedure. This is a courtesy call and so if there is no answer at the home number and we have not heard from you through the emergency physician on call, we will assume that you have returned to your regular daily activities without incident.  SIGNATURES/CONFIDENTIALITY: You and/or your care partner have signed paperwork which will be entered into your electronic medical record.  These signatures attest to the fact that that the information above on your After Visit Summary has been reviewed and is understood.  Full responsibility of the confidentiality   of this discharge information lies with you and/or your care-partner.     

## 2014-01-15 NOTE — Progress Notes (Signed)
A/ox3 pleased with MAC, report to Karen RN 

## 2014-01-15 NOTE — Op Note (Signed)
[facility]  [address]. [place], [address]   COLONOSCOPY PROCEDURE REPORT  PATIENT: Douglas Richards, Douglas Richards  MR#: [id number] BIRTHDATE: 08-Sep-1945 , 68  yrs. old GENDER: male ENDOSCOPIST: Eustace Quail, MD REFERRED YT:[id number] Program Recall PROCEDURE DATE:  01/15/2014 PROCEDURE:   Colonoscopy with snare polypectomy x 1 First Screening Colonoscopy - Avg.  risk and is 50 yrs.  old or older - No.  Prior Negative Screening - Now for repeat screening. N/A  History of Adenoma - Now for follow-up colonoscopy & has been > or = to 3 yrs.  Yes hx of adenoma.  Has been 3 or more years since last colonoscopy.  Polyps Removed Today? Yes. ASA CLASS:   Class II INDICATIONS:surveillance colonoscopy based on a history of adenomatous colonic polyp(s).   2000 (large sessile), 01,02,04,2010 (small TAs) MEDICATIONS: Monitored anesthesia care and Propofol 250 mg IV  DESCRIPTION OF PROCEDURE:   After the risks benefits and alternatives of the procedure were thoroughly explained, informed consent was obtained.  The digital rectal exam revealed no abnormalities of the rectum.   The LB [id number]  endoscope was introduced through the anus and advanced to the cecum, which was identified by both the appendix and ileocecal valve. No adverse events experienced.   The quality of the prep was good, using MoviPrep  The instrument was then slowly withdrawn as the colon was fully examined.  COLON FINDINGS: A sessile polyp measuring 6 mm in size was found in the ascending colon.  A polypectomy was performed with a cold snare.  The resection was complete, the polyp tissue was completely retrieved and sent to histology.   There was moderate diverticulosis noted in the left colon.   The examination was otherwise normal.  Retroflexed views revealed internal hemorrhoids. The time to cecum=3 minutes 27 seconds.  Withdrawal time=15 minutes 09 seconds.  The scope was withdrawn and the procedure  completed. COMPLICATIONS: There were no immediate complications.  ENDOSCOPIC IMPRESSION: 1.   Sessile polyp measuring 6 mm in size was found in the ascending colon; polypectomy was performed with a cold snare 2.   Moderate diverticulosis was noted in the left colon 3.   The examination was otherwise normal  RECOMMENDATIONS: 1. Follow up colonoscopy in 5 years  eSigned:  Eustace Quail, MD 01/15/2014 10:43 AM   cc: The Patient and [facility] [place].  Plotnikov, MD

## 2014-01-16 ENCOUNTER — Telehealth: Payer: Self-pay | Admitting: *Deleted

## 2014-01-16 NOTE — Telephone Encounter (Signed)
  Follow up Call-  Call back number 01/15/2014  Post procedure Call Back phone  # [phone]  Permission to leave phone message Yes     Patient questions:  Do you have a fever, pain , or abdominal swelling? No. Pain Score  0 *  Have you tolerated food without any problems? Yes.    Have you been able to return to your normal activities? Yes.    Do you have any questions about your discharge instructions: Diet   No. Medications  No. Follow up visit  No.  Do you have questions or concerns about your Care? No.  Actions: * If pain score is 4 or above: No action needed, pain <4.

## 2014-01-21 ENCOUNTER — Encounter: Payer: Self-pay | Admitting: Internal Medicine

## 2014-01-27 NOTE — Assessment & Plan Note (Signed)
Continue with current prescription therapy as reflected on the Med list.  

## 2014-02-12 ENCOUNTER — Telehealth: Payer: Self-pay | Admitting: Internal Medicine

## 2014-02-12 NOTE — Telephone Encounter (Signed)
Pt wife called and had some questions about medication?  She was requesting a call from [facility] nurse

## 2014-02-13 MED ORDER — KOMBIGLYZE XR 2.5-1000 MG PO TB24: ORAL_TABLET | ORAL | Status: DC

## 2014-02-13 NOTE — Telephone Encounter (Signed)
Pt requesting printed Rx of Kombiglyze. Rx printed/signed/upfront for p/u. Pt informed

## 2014-03-16 ENCOUNTER — Telehealth: Payer: Self-pay | Admitting: Internal Medicine

## 2014-03-16 NOTE — Telephone Encounter (Signed)
We need to call [phone] . We need to advise need for Kombiglyze. Pt has tried/failed metformin but it caused nausea. Pt's Mem ID is [id number].

## 2014-03-16 NOTE — Telephone Encounter (Signed)
I called pt to get more details. He states his wife has all the info we need and will call back with info and what is needed from [place].

## 2014-03-16 NOTE — Telephone Encounter (Signed)
I do not understand. Thx

## 2014-03-16 NOTE — Telephone Encounter (Signed)
[employer] directed patient to contact Dr. Alain Marion to add kombiglyze to formulary.   Patient has 30 days left of this med.

## 2014-03-17 NOTE — Telephone Encounter (Signed)
Wife called back.  She states you can reach [place] at [phone] if you have issues.

## 2014-03-18 NOTE — Telephone Encounter (Signed)
I called pt's ins to initiate PA. Form is being faxed to our office.

## 2014-03-24 NOTE — Telephone Encounter (Signed)
Form faxed on 03/21/14, received, partially completed, given to MD for review, to complete and sign. Completed form faxed to [facility] at [phone]. Sent forms to be scanned in to chart.

## 2014-03-25 ENCOUNTER — Telehealth: Payer: Self-pay | Admitting: Internal Medicine

## 2014-03-25 ENCOUNTER — Telehealth: Payer: Self-pay | Admitting: *Deleted

## 2014-03-25 NOTE — Telephone Encounter (Signed)
Pt called in an wanted know what tier this medication was in?

## 2014-03-25 NOTE — Telephone Encounter (Signed)
Kombiglyze XR 2.06-998 mg PA is approved from 03/25/2014 until 02/06/2015. Pt's wife informed.

## 2014-03-30 NOTE — Telephone Encounter (Signed)
Pt informed to contact pharmacy re: below.

## 2014-05-15 ENCOUNTER — Ambulatory Visit: Payer: 59 | Admitting: Internal Medicine

## 2014-06-01 ENCOUNTER — Telehealth: Payer: Self-pay | Admitting: Internal Medicine

## 2014-06-01 NOTE — Telephone Encounter (Signed)
States someone called wanting to know when last eye exam was.  Patients wife states it was in January or February.  Patient seen Dr. Tommy Rainwater.

## 2014-06-15 ENCOUNTER — Telehealth: Payer: Self-pay

## 2014-06-15 NOTE — Telephone Encounter (Signed)
Call to Dr. Tommy Rainwater office and confirmed annual eye exam on 03/24/2014. Updated metrics

## 2014-06-24 ENCOUNTER — Other Ambulatory Visit (INDEPENDENT_AMBULATORY_CARE_PROVIDER_SITE_OTHER): Payer: Medicare (Managed Care)

## 2014-06-24 DIAGNOSIS — E119 Type 2 diabetes mellitus without complications: Secondary | ICD-10-CM | POA: Diagnosis not present

## 2014-06-24 LAB — BASIC METABOLIC PANEL
BUN: 21 mg/dL (ref 6–23)
CALCIUM: 9.7 mg/dL (ref 8.4–10.5)
CO2: 26 mEq/L (ref 19–32)
Chloride: 100 mEq/L (ref 96–112)
Creatinine, Ser: 1.56 mg/dL — ABNORMAL HIGH (ref 0.40–1.50)
GFR: 47.17 mL/min — ABNORMAL LOW (ref >60.00)
Glucose, Bld: 150 mg/dL — ABNORMAL HIGH (ref 70–99)
Potassium: 4.8 mEq/L (ref 3.5–5.1)
SODIUM: 135 meq/L (ref 135–145)

## 2014-06-24 LAB — HEMOGLOBIN A1C: Hgb A1c MFr Bld: 7.6 % — ABNORMAL HIGH (ref 4.6–6.5)

## 2014-06-30 ENCOUNTER — Encounter: Payer: Self-pay | Admitting: Internal Medicine

## 2014-06-30 ENCOUNTER — Ambulatory Visit (INDEPENDENT_AMBULATORY_CARE_PROVIDER_SITE_OTHER): Payer: PPO | Admitting: Internal Medicine

## 2014-06-30 VITALS — BP 150/92 | HR 84 | Wt 208.0 lb

## 2014-06-30 DIAGNOSIS — E118 Type 2 diabetes mellitus with unspecified complications: Secondary | ICD-10-CM | POA: Diagnosis not present

## 2014-06-30 DIAGNOSIS — M1000 Idiopathic gout, unspecified site: Secondary | ICD-10-CM | POA: Diagnosis not present

## 2014-06-30 DIAGNOSIS — I10 Essential (primary) hypertension: Secondary | ICD-10-CM

## 2014-06-30 NOTE — Progress Notes (Signed)
Pre visit review using our clinic review tool, if applicable. No additional management support is needed unless otherwise documented below in the visit note. 

## 2014-06-30 NOTE — Assessment & Plan Note (Addendum)
BP Readings from Last 3 Encounters:  06/30/14 150/92  01/15/14 115/76  01/13/14 138/82   Amlodipine. Nl BP at home

## 2014-06-30 NOTE — Progress Notes (Signed)
   Subjective:    HPI 7/15 - Brother just died w/lupus and CV issues  The patient presents for a follow-up of  chronic hypertension, chronic dyslipidemia, type 2 diabetes, gout controlled with medicines. He got laid off - no jobs. He is working on projects at home.  Stress w/stepson still cont on.Marland KitchenMarland KitchenHis dtr is sick w/migraines - much better after scalp decompression surgery - much better.. Diet coke 2l/d  BP Readings from Last 3 Encounters:  06/30/14 150/92  01/15/14 115/76  01/13/14 138/82   Wt Readings from Last 3 Encounters:  06/30/14 208 lb (94.348 kg)  01/15/14 200 lb (90.719 kg)  01/13/14 196 lb (88.905 kg)      Review of Systems  Constitutional: Negative for appetite change and unexpected weight change.  HENT: Negative for nosebleeds and sneezing.   Eyes: Negative for itching and visual disturbance.  Cardiovascular: Negative for palpitations and leg swelling.  Gastrointestinal: Negative for nausea, blood in stool and abdominal distention.  Genitourinary: Negative for frequency and hematuria.  Musculoskeletal: Negative for back pain, joint swelling and gait problem.  Neurological: Negative for dizziness, tremors and speech difficulty.  Psychiatric/Behavioral: Negative for suicidal ideas, sleep disturbance, dysphoric mood and agitation. The patient is nervous/anxious (stressed).        Objective:   Physical Exam  Constitutional: He is oriented to person, place, and time. He appears well-developed. No distress.  NAD  HENT:  Mouth/Throat: Oropharynx is clear and moist.  Eyes: Conjunctivae are normal. Pupils are equal, round, and reactive to light.  Neck: Normal range of motion. No JVD present. No thyromegaly present.  Cardiovascular: Normal rate, regular rhythm, normal heart sounds and intact distal pulses.  Exam reveals no gallop and no friction rub.   No murmur heard. Pulmonary/Chest: Effort normal and breath sounds normal. No respiratory distress. He has no wheezes.  He has no rales. He exhibits no tenderness.  Abdominal: Soft. Bowel sounds are normal. He exhibits no distension and no mass. There is no tenderness. There is no rebound and no guarding.  Musculoskeletal: Normal range of motion. He exhibits no edema or tenderness.  Lymphadenopathy:    He has no cervical adenopathy.  Neurological: He is alert and oriented to person, place, and time. He has normal reflexes. No cranial nerve deficit. He exhibits normal muscle tone. He displays a negative Romberg sign. Coordination and gait normal.  No meningeal signs  Skin: Skin is warm and dry. No rash noted.  Psychiatric: He has a normal mood and affect. His behavior is normal. Judgment and thought content normal.  neck - tender w/ROM    Lab Results  Component Value Date   WBC 8.2 05/17/2012   HGB 13.7 05/17/2012   HCT 40.8 05/17/2012   PLT 155.0 05/17/2012   GLUCOSE 150* 06/24/2014   CHOL 184 09/10/2013   TRIG 321.0* 09/10/2013   HDL 36.70* 09/10/2013   LDLDIRECT 105.5 09/10/2013   LDLCALC 66 08/30/2011   ALT 21 05/17/2012   AST 19 05/17/2012   NA 135 06/24/2014   K 4.8 06/24/2014   CL 100 06/24/2014   CREATININE 1.56* 06/24/2014   BUN 21 06/24/2014   CO2 26 06/24/2014   TSH 2.48 05/17/2012   PSA 0.91 05/17/2012   HGBA1C 7.6* 06/24/2014   MICROALBUR 0.4 03/31/2010       Assessment & Plan:

## 2014-06-30 NOTE — Assessment & Plan Note (Addendum)
Kombigylze XR Sub-optimal CBG control has been discussed. Pt would like to focus on wt loss - RTC 3 mo

## 2014-06-30 NOTE — Assessment & Plan Note (Signed)
Doing well On Allopurinol

## 2014-08-03 ENCOUNTER — Other Ambulatory Visit: Payer: Self-pay

## 2014-09-22 ENCOUNTER — Other Ambulatory Visit: Payer: Self-pay | Admitting: Internal Medicine

## 2014-09-25 ENCOUNTER — Other Ambulatory Visit: Payer: Self-pay | Admitting: Emergency Medicine

## 2014-09-25 MED ORDER — OMEPRAZOLE 20 MG PO CPDR: 20.0000 mg | DELAYED_RELEASE_CAPSULE | Freq: Every day | ORAL | Status: DC

## 2014-09-25 NOTE — Telephone Encounter (Signed)
Pt has not taken Omeprazole since 2015, not currently on med list, please advise if pt needs refill

## 2014-09-30 ENCOUNTER — Other Ambulatory Visit: Payer: Self-pay | Admitting: Emergency Medicine

## 2014-09-30 MED ORDER — OMEPRAZOLE 20 MG PO CPDR: 20.0000 mg | DELAYED_RELEASE_CAPSULE | Freq: Every day | ORAL | Status: DC

## 2014-10-13 ENCOUNTER — Other Ambulatory Visit (INDEPENDENT_AMBULATORY_CARE_PROVIDER_SITE_OTHER): Payer: PPO

## 2014-10-13 DIAGNOSIS — E118 Type 2 diabetes mellitus with unspecified complications: Secondary | ICD-10-CM | POA: Diagnosis not present

## 2014-10-13 LAB — HEMOGLOBIN A1C: Hgb A1c MFr Bld: 7.9 % — ABNORMAL HIGH (ref 4.6–6.5)

## 2014-10-13 LAB — BASIC METABOLIC PANEL
BUN: 29 mg/dL — ABNORMAL HIGH (ref 6–23)
CO2: 23 meq/L (ref 19–32)
CREATININE: 1.68 mg/dL — AB (ref 0.40–1.50)
Calcium: 9.7 mg/dL (ref 8.4–10.5)
Chloride: 101 mEq/L (ref 96–112)
GFR: 43.27 mL/min — ABNORMAL LOW (ref >60.00)
GLUCOSE: 163 mg/dL — AB (ref 70–99)
Potassium: 4.5 mEq/L (ref 3.5–5.1)
Sodium: 136 mEq/L (ref 135–145)

## 2014-10-21 ENCOUNTER — Ambulatory Visit (INDEPENDENT_AMBULATORY_CARE_PROVIDER_SITE_OTHER): Payer: PPO | Admitting: Internal Medicine

## 2014-10-21 ENCOUNTER — Encounter: Payer: Self-pay | Admitting: Internal Medicine

## 2014-10-21 VITALS — BP 138/84 | HR 89 | Wt 205.0 lb

## 2014-10-21 DIAGNOSIS — Z23 Encounter for immunization: Secondary | ICD-10-CM

## 2014-10-21 DIAGNOSIS — I10 Essential (primary) hypertension: Secondary | ICD-10-CM

## 2014-10-21 DIAGNOSIS — M1000 Idiopathic gout, unspecified site: Secondary | ICD-10-CM

## 2014-10-21 DIAGNOSIS — E1165 Type 2 diabetes mellitus with hyperglycemia: Secondary | ICD-10-CM | POA: Diagnosis not present

## 2014-10-21 MED ORDER — DULAGLUTIDE 1.5 MG/0.5ML ~~LOC~~ SOAJ: SUBCUTANEOUS | Status: DC

## 2014-10-21 MED ORDER — EMPAGLIFLOZIN-METFORMIN HCL 12.5-1000 MG PO TABS: 1.0000 | ORAL_TABLET | Freq: Two times a day (BID) | ORAL | Status: DC

## 2014-10-21 MED ORDER — METFORMIN HCL 500 MG PO TABS: 500.0000 mg | ORAL_TABLET | Freq: Two times a day (BID) | ORAL | Status: DC

## 2014-10-21 NOTE — Assessment & Plan Note (Signed)
D/c Kombigylze XR. Synjardy bid to start

## 2014-10-21 NOTE — Assessment & Plan Note (Signed)
Synjardy bid should help

## 2014-10-21 NOTE — Progress Notes (Signed)
Pre visit review using our clinic review tool, if applicable. No additional management support is needed unless otherwise documented below in the visit note. 

## 2014-10-21 NOTE — Progress Notes (Signed)
Subjective:  Patient ID: Douglas Richards, male    DOB: 1945/09/29  Age: 69 y.o. MRN: [id number]  CC: No chief complaint on file.   HPI RIC ROSENBERG presents for DM, HTN, OA f/u. CBGs 150s. BP ok at home  Outpatient Prescriptions Prior to Visit  Medication Sig Dispense Refill  . allopurinol (ZYLOPRIM) 300 MG tablet Take 1 tablet (300 mg total) by mouth daily. 90 tablet 3  . amLODipine (NORVASC) 5 MG tablet Take 1 tablet (5 mg total) by mouth daily. 90 tablet 3  . aspirin 325 MG tablet Take 325 mg by mouth daily.      . cetirizine (ZYRTEC) 10 MG tablet Take 1 tablet (10 mg total) by mouth daily. 30 tablet 1  . Cholecalciferol (VITAMIN D3) 1000 UNITS tablet Take 1,000 Units by mouth daily.      . diazepam (VALIUM) 5 MG tablet Take 0.5-1 tablets (2.5-5 mg total) by mouth every 12 (twelve) hours as needed for muscle spasms. 60 tablet 1  . Diclofenac Sodium 1.5 % SOLN Place onto the skin 3 (three) times daily.      Marland Kitchen esomeprazole (NEXIUM) 40 MG capsule Take 1 capsule (40 mg total) by mouth daily. 90 capsule 3  . finasteride (PROSCAR) 5 MG tablet Take 1 tablet (5 mg total) by mouth daily. 90 tablet 3  . fluticasone (FLONASE) 50 MCG/ACT nasal spray Place 2 sprays into the nose daily. 48 g g  . Glucosamine-Chondroitin ([facility] GLUCOSAMINE-CHONDROITIN) 750-600 MG TABS Take by mouth 2 (two) times daily.      . hydrocortisone (ANUSOL-HC) 2.5 % rectal cream Place rectally 2 (two) times daily as needed. 90 g 3  . HYDROCORTISONE ACE, RECTAL, (HEMRIL-30) 30 MG SUPP Place 1 suppository (30 mg total) rectally 2 (two) times daily. 60 each 1  . hydrOXYzine (ATARAX/VISTARIL) 25 MG tablet Take 1-2 tablets (25-50 mg total) by mouth every 8 (eight) hours as needed for itching. 60 tablet 1  . loratadine (CLARITIN) 10 MG tablet Take 10 mg by mouth daily.    . meloxicam (MOBIC) 15 MG tablet Take 1 tablet (15 mg total) by mouth daily. 90 tablet 3  . omeprazole (PRILOSEC) 20 MG capsule Take 1 capsule (20 mg total) by  mouth daily. 90 capsule 3  . rosuvastatin (CRESTOR) 10 MG tablet Take 1 tablet (10 mg total) by mouth daily. (Patient taking differently: Take 5 mg by mouth 2 (two) times a week. ) 90 tablet 3  . trandolapril (MAVIK) 4 MG tablet Take 1 tablet (4 mg total) by mouth daily. 90 tablet 3  . triamcinolone cream (KENALOG) 0.1 % Apply 1 application topically 2 (two) times daily. 90 g 3  . KOMBIGLYZE XR 2.06-998 MG TB24 TAKE 1 TABLET BY MOUTH TWICE DAILY 60 tablet 11  . omeprazole (PRILOSEC) 20 MG capsule TAKE ONE CAPSULE BY MOUTH DAILY 60 capsule 11   No facility-administered medications prior to visit.    ROS Review of Systems  Constitutional: Negative for appetite change, fatigue and unexpected weight change.  HENT: Negative for congestion, nosebleeds, sneezing, sore throat and trouble swallowing.   Eyes: Negative for itching and visual disturbance.  Respiratory: Negative for cough.   Cardiovascular: Negative for chest pain, palpitations and leg swelling.  Gastrointestinal: Negative for nausea, diarrhea, blood in stool and abdominal distention.  Genitourinary: Negative for frequency and hematuria.  Musculoskeletal: Positive for back pain, arthralgias, neck pain and neck stiffness. Negative for joint swelling and gait problem.  Skin: Negative for rash.  Neurological: Negative for dizziness, tremors, speech difficulty and weakness.  Psychiatric/Behavioral: Negative for sleep disturbance, dysphoric mood and agitation. The patient is not nervous/anxious.     Objective:  BP 138/84 mmHg  Pulse 89  Wt 205 lb (92.987 kg)  SpO2 98%  BP Readings from Last 3 Encounters:  10/21/14 138/84  06/30/14 150/92  01/15/14 115/76    Wt Readings from Last 3 Encounters:  10/21/14 205 lb (92.987 kg)  06/30/14 208 lb (94.348 kg)  01/15/14 200 lb (90.719 kg)    Physical Exam  Constitutional: He is oriented to person, place, and time. He appears well-developed. No distress.  NAD  HENT:  Mouth/Throat:  Oropharynx is clear and moist.  Eyes: Conjunctivae are normal. Pupils are equal, round, and reactive to light.  Neck: Normal range of motion. No JVD present. No thyromegaly present.  Cardiovascular: Normal rate, regular rhythm, normal heart sounds and intact distal pulses.  Exam reveals no gallop and no friction rub.   No murmur heard. Pulmonary/Chest: Effort normal and breath sounds normal. No respiratory distress. He has no wheezes. He has no rales. He exhibits no tenderness.  Abdominal: Soft. Bowel sounds are normal. He exhibits no distension and no mass. There is no tenderness. There is no rebound and no guarding.  Musculoskeletal: Normal range of motion. He exhibits no edema or tenderness.  Lymphadenopathy:    He has no cervical adenopathy.  Neurological: He is alert and oriented to person, place, and time. He has normal reflexes. No cranial nerve deficit. He exhibits normal muscle tone. He displays a negative Romberg sign. Coordination and gait normal.  Skin: Skin is warm and dry. No rash noted.  Psychiatric: He has a normal mood and affect. His behavior is normal. Judgment and thought content normal.   Neck hurts w/ROM  Lab Results  Component Value Date   WBC 8.2 05/17/2012   HGB 13.7 05/17/2012   HCT 40.8 05/17/2012   PLT 155.0 05/17/2012   GLUCOSE 163* 10/13/2014   CHOL 184 09/10/2013   TRIG 321.0* 09/10/2013   HDL 36.70* 09/10/2013   LDLDIRECT 105.5 09/10/2013   LDLCALC 66 08/30/2011   ALT 21 05/17/2012   AST 19 05/17/2012   NA 136 10/13/2014   K 4.5 10/13/2014   CL 101 10/13/2014   CREATININE 1.68* 10/13/2014   BUN 29* 10/13/2014   CO2 23 10/13/2014   TSH 2.48 05/17/2012   PSA 0.91 05/17/2012   HGBA1C 7.9* 10/13/2014   MICROALBUR 0.4 03/31/2010    Dg Cervical Spine Complete  04/24/2013   CLINICAL DATA:  Severe right neck pain for 3 weeks  EXAM: CERVICAL SPINE  4+ VIEWS  COMPARISON:  None.  FINDINGS: No evidence of acute fracture or malalignment. No significant  prevertebral soft tissue swelling. Mild multilevel cervical spondylosis. There is a moderate right foraminal narrowing at C5-C6 and mild right foraminal narrowing at C4-C5. Lung apices are unremarkable. Normal bony mineralization. No lytic or blastic osseous lesion.  IMPRESSION: Multilevel cervical spondylosis with moderate right foraminal narrowing at C5-C6 and mild right foraminal narrowing at C4-C5  .   Electronically Signed   By: Jacqulynn Cadet M.D.   On: 04/24/2013 15:10    Assessment & Plan:   Diagnoses and all orders for this visit:  Need for influenza vaccination -     Flu Vaccine QUAD 36+ mos IM  Type 2 diabetes mellitus with hyperglycemia  Essential hypertension  Idiopathic gout, unspecified chronicity, unspecified site  Other orders -  Discontinue: Empagliflozin-Metformin HCl (SYNJARDY) 12.06-998 MG TABS; Take 1 tablet by mouth 2 (two) times daily. -     metFORMIN (GLUCOPHAGE) 500 MG tablet; Take 1 tablet (500 mg total) by mouth 2 (two) times daily with a meal. -     Dulaglutide (TRULICITY) 1.5 KX/3.8HW SOPN; 1 inj weekly   I have discontinued Mr. Petrak KOMBIGLYZE XR and Empagliflozin-Metformin HCl. I am also having him start on metFORMIN and Dulaglutide. Additionally, I am having him maintain his aspirin, Glucosamine-Chondroitin, Diclofenac Sodium, cholecalciferol, meloxicam, fluticasone, hydrocortisone, HYDROCORTISONE ACE (RECTAL), cetirizine, hydrOXYzine, finasteride, diazepam, triamcinolone cream, allopurinol, amLODipine, esomeprazole, rosuvastatin, trandolapril, loratadine, and omeprazole.  Meds ordered this encounter  Medications  . DISCONTD: Empagliflozin-Metformin HCl (SYNJARDY) 12.06-998 MG TABS    Sig: Take 1 tablet by mouth 2 (two) times daily.    Dispense:  60 tablet    Refill:  11  . metFORMIN (GLUCOPHAGE) 500 MG tablet    Sig: Take 1 tablet (500 mg total) by mouth 2 (two) times daily with a meal.    Dispense:  60 tablet    Refill:  11  . Dulaglutide  (TRULICITY) 1.5 EX/9.3ZJ SOPN    Sig: 1 inj weekly    Dispense:  4 pen    Refill:  11     Follow-up: Return in about 3 months (around 01/20/2015) for a follow-up visit.  [name], MD

## 2014-10-21 NOTE — Assessment & Plan Note (Signed)
Doing well On Allopurinol  

## 2014-10-22 ENCOUNTER — Telehealth: Payer: Self-pay | Admitting: Internal Medicine

## 2014-10-22 NOTE — Telephone Encounter (Signed)
Pt wife called in and said that the Trulichity is again is in a higher tier. She wanted to know what needs to be done this time to fix that.

## 2014-10-23 NOTE — Telephone Encounter (Signed)
PA initiated/submitted to plan via CoverMyMeds.

## 2014-10-26 MED ORDER — EXENATIDE ER 2 MG ~~LOC~~ PEN: PEN_INJECTOR | SUBCUTANEOUS | Status: DC

## 2014-10-26 NOTE — Telephone Encounter (Signed)
[place] called to advise that healthteam advantage denied Trulichity. They said that they would pay for symlnpen (would need PA) or vyctoza (would need PA) or bydureon    He took his first Trulichity before the weekend, and became flushed and nauseated

## 2014-10-26 NOTE — Telephone Encounter (Signed)
Ok Bydureon Thx

## 2014-10-27 MED ORDER — ONDANSETRON HCL 4 MG PO TABS: 4.0000 mg | ORAL_TABLET | Freq: Three times a day (TID) | ORAL | Status: DC | PRN

## 2014-10-27 NOTE — Telephone Encounter (Signed)
Pt informed

## 2014-10-27 NOTE — Telephone Encounter (Signed)
Patient states that he would like something RX to help with nausea from his trulichity dose. He is using [facility] outpatient pharmacy

## 2014-10-27 NOTE — Telephone Encounter (Signed)
OK Zofran Thx

## 2014-10-27 NOTE — Addendum Note (Signed)
Addended by: Cassandria Anger on: 10/27/2014 05:24 PM   Modules accepted: Orders

## 2014-10-28 NOTE — Telephone Encounter (Signed)
Notified pt zofran sent to pharmacy...Douglas Richards

## 2014-11-04 ENCOUNTER — Other Ambulatory Visit: Payer: Self-pay | Admitting: Internal Medicine

## 2014-11-04 ENCOUNTER — Encounter: Payer: Self-pay | Admitting: Internal Medicine

## 2014-11-04 ENCOUNTER — Ambulatory Visit (INDEPENDENT_AMBULATORY_CARE_PROVIDER_SITE_OTHER): Payer: PPO | Admitting: Internal Medicine

## 2014-11-04 ENCOUNTER — Other Ambulatory Visit (INDEPENDENT_AMBULATORY_CARE_PROVIDER_SITE_OTHER): Payer: PPO

## 2014-11-04 ENCOUNTER — Ambulatory Visit (INDEPENDENT_AMBULATORY_CARE_PROVIDER_SITE_OTHER)
Admission: RE | Admit: 2014-11-04 | Discharge: 2014-11-04 | Disposition: A | Discharge status: Home / Self Care | Payer: PPO | Source: Ambulatory Visit | Attending: Internal Medicine | Admitting: Internal Medicine

## 2014-11-04 VITALS — BP 142/96 | HR 103 | Temp 98.0°F | Wt 196.0 lb

## 2014-11-04 DIAGNOSIS — R1010 Upper abdominal pain, unspecified: Secondary | ICD-10-CM

## 2014-11-04 DIAGNOSIS — E119 Type 2 diabetes mellitus without complications: Secondary | ICD-10-CM

## 2014-11-04 DIAGNOSIS — R140 Abdominal distension (gaseous): Secondary | ICD-10-CM

## 2014-11-04 DIAGNOSIS — R1033 Periumbilical pain: Secondary | ICD-10-CM

## 2014-11-04 DIAGNOSIS — R109 Unspecified abdominal pain: Secondary | ICD-10-CM | POA: Insufficient documentation

## 2014-11-04 LAB — URINALYSIS
Bilirubin Urine: NEGATIVE
Hgb urine dipstick: NEGATIVE
LEUKOCYTES UA: NEGATIVE
Nitrite: NEGATIVE
PH: 5.5 (ref 5.0–8.0)
SPECIFIC GRAVITY, URINE: 1.02 (ref 1.000–1.030)
Total Protein, Urine: NEGATIVE
URINE GLUCOSE: NEGATIVE
UROBILINOGEN UA: 0.2 (ref 0.0–1.0)

## 2014-11-04 LAB — LIPASE: LIPASE: 140 U/L — AB (ref 11.0–59.0)

## 2014-11-04 LAB — BASIC METABOLIC PANEL
BUN: 18 mg/dL (ref 6–23)
CHLORIDE: 100 meq/L (ref 96–112)
CO2: 26 meq/L (ref 19–32)
CREATININE: 1.63 mg/dL — AB (ref 0.40–1.50)
Calcium: 10.1 mg/dL (ref 8.4–10.5)
GFR: 44.8 mL/min — ABNORMAL LOW (ref >60.00)
Glucose, Bld: 138 mg/dL — ABNORMAL HIGH (ref 70–99)
POTASSIUM: 4.7 meq/L (ref 3.5–5.1)
Sodium: 135 mEq/L (ref 135–145)

## 2014-11-04 LAB — CBC WITH DIFFERENTIAL/PLATELET
BASOS ABS: 0 10*3/uL (ref 0.0–0.1)
Basophils Relative: 0.3 % (ref 0.0–3.0)
EOS ABS: 0.2 10*3/uL (ref 0.0–0.7)
Eosinophils Relative: 1.6 % (ref 0.0–5.0)
HEMATOCRIT: 42.8 % (ref 39.0–52.0)
HEMOGLOBIN: 14.3 g/dL (ref 13.0–17.0)
LYMPHS PCT: 22.8 % (ref 12.0–46.0)
Lymphs Abs: 2.9 10*3/uL (ref 0.7–4.0)
MCHC: 33.4 g/dL (ref 30.0–36.0)
MCV: 92.6 fl (ref 78.0–100.0)
MONO ABS: 0.9 10*3/uL (ref 0.1–1.0)
Monocytes Relative: 7.3 % (ref 3.0–12.0)
Neutro Abs: 8.8 10*3/uL — ABNORMAL HIGH (ref 1.4–7.7)
Neutrophils Relative %: 68 % (ref 43.0–77.0)
Platelets: 210 10*3/uL (ref 150.0–400.0)
RBC: 4.62 Mil/uL (ref 4.22–5.81)
RDW: 14.5 % (ref 11.5–15.5)
WBC: 12.9 10*3/uL — AB (ref 4.0–10.5)

## 2014-11-04 LAB — HEPATIC FUNCTION PANEL
ALK PHOS: 57 U/L (ref 39–117)
ALT: 16 U/L (ref 0–53)
AST: 17 U/L (ref 0–37)
Albumin: 4.4 g/dL (ref 3.5–5.2)
BILIRUBIN DIRECT: 0.1 mg/dL (ref 0.0–0.3)
BILIRUBIN TOTAL: 0.3 mg/dL (ref 0.2–1.2)
TOTAL PROTEIN: 7.6 g/dL (ref 6.0–8.3)

## 2014-11-04 LAB — SEDIMENTATION RATE: SED RATE: 27 mm/h — AB (ref 0–22)

## 2014-11-04 NOTE — Patient Instructions (Signed)
Linzess if CT OK

## 2014-11-04 NOTE — Assessment & Plan Note (Signed)
Labs, CT

## 2014-11-04 NOTE — Addendum Note (Signed)
Addended by: Cresenciano Lick on: 11/04/2014 02:14 PM   Modules accepted: Orders

## 2014-11-04 NOTE — Assessment & Plan Note (Signed)
upper half - r/o partial SBO, pancreatitis, diverticulitis vs other Labs stat Abd CT To Er If worse Soups, clear liquids

## 2014-11-04 NOTE — Progress Notes (Signed)
Pre visit review using our clinic review tool, if applicable. No additional management support is needed unless otherwise documented below in the visit note. 

## 2014-11-04 NOTE — Progress Notes (Signed)
Subjective:  Patient ID: Douglas Richards, male    DOB: [birth year]  Age: 69 y.o. MRN: [id number]  CC: No chief complaint on file.   HPI HANSON MEDEIROS presents for abd pain, pressure since Sat-Sun. Pain is worse w/meals. CBGs 100-110. Had a low grade fever. C/o n/v on Tue last week. No BMx 1 week. Pt took his first and last trulicity dose 2 weeks ago  Outpatient Prescriptions Prior to Visit  Medication Sig Dispense Refill  . allopurinol (ZYLOPRIM) 300 MG tablet Take 1 tablet (300 mg total) by mouth daily. 90 tablet 3  . amLODipine (NORVASC) 5 MG tablet Take 1 tablet (5 mg total) by mouth daily. 90 tablet 3  . aspirin 325 MG tablet Take 325 mg by mouth daily.      . cetirizine (ZYRTEC) 10 MG tablet Take 1 tablet (10 mg total) by mouth daily. 30 tablet 1  . Cholecalciferol (VITAMIN D3) 1000 UNITS tablet Take 1,000 Units by mouth daily.      . diazepam (VALIUM) 5 MG tablet Take 0.5-1 tablets (2.5-5 mg total) by mouth every 12 (twelve) hours as needed for muscle spasms. 60 tablet 1  . Diclofenac Sodium 1.5 % SOLN Place onto the skin 3 (three) times daily.      Marland Kitchen esomeprazole (NEXIUM) 40 MG capsule Take 1 capsule (40 mg total) by mouth daily. 90 capsule 3  . Exenatide ER (BYDUREON) 2 MG PEN 2 mg sq weekly as directed 4 each 11  . finasteride (PROSCAR) 5 MG tablet Take 1 tablet (5 mg total) by mouth daily. 90 tablet 3  . fluticasone (FLONASE) 50 MCG/ACT nasal spray Place 2 sprays into the nose daily. 48 g g  . Glucosamine-Chondroitin ([employer] GLUCOSAMINE-CHONDROITIN) 750-600 MG TABS Take by mouth 2 (two) times daily.      . hydrocortisone (ANUSOL-HC) 2.5 % rectal cream Place rectally 2 (two) times daily as needed. 90 g 3  . HYDROCORTISONE ACE, RECTAL, (HEMRIL-30) 30 MG SUPP Place 1 suppository (30 mg total) rectally 2 (two) times daily. 60 each 1  . hydrOXYzine (ATARAX/VISTARIL) 25 MG tablet Take 1-2 tablets (25-50 mg total) by mouth every 8 (eight) hours as needed for itching. 60 tablet 1  .  loratadine (CLARITIN) 10 MG tablet Take 10 mg by mouth daily.    . meloxicam (MOBIC) 15 MG tablet Take 1 tablet (15 mg total) by mouth daily. 90 tablet 3  . metFORMIN (GLUCOPHAGE) 500 MG tablet Take 1 tablet (500 mg total) by mouth 2 (two) times daily with a meal. 60 tablet 11  . omeprazole (PRILOSEC) 20 MG capsule Take 1 capsule (20 mg total) by mouth daily. 90 capsule 3  . ondansetron (ZOFRAN) 4 MG tablet Take 1 tablet (4 mg total) by mouth every 8 (eight) hours as needed for nausea or vomiting. 20 tablet 1  . rosuvastatin (CRESTOR) 10 MG tablet Take 1 tablet (10 mg total) by mouth daily. (Patient taking differently: Take 5 mg by mouth 2 (two) times a week. ) 90 tablet 3  . trandolapril (MAVIK) 4 MG tablet Take 1 tablet (4 mg total) by mouth daily. 90 tablet 3  . triamcinolone cream (KENALOG) 0.1 % Apply 1 application topically 2 (two) times daily. 90 g 3   No facility-administered medications prior to visit.    ROS Review of Systems  Constitutional: Positive for fever. Negative for chills, appetite change, fatigue and unexpected weight change.  HENT: Negative for congestion, nosebleeds, sneezing, sore throat and trouble swallowing.  Eyes: Negative for itching and visual disturbance.  Respiratory: Negative for cough.   Cardiovascular: Negative for chest pain, palpitations and leg swelling.  Gastrointestinal: Positive for nausea, abdominal pain, constipation and abdominal distention. Negative for diarrhea and blood in stool.  Genitourinary: Negative for frequency and hematuria.  Musculoskeletal: Negative for back pain, joint swelling, gait problem and neck pain.  Skin: Negative for rash.  Neurological: Negative for dizziness, tremors, speech difficulty and weakness.  Psychiatric/Behavioral: Negative for suicidal ideas, sleep disturbance, dysphoric mood and agitation. The patient is not nervous/anxious.     Objective:  BP 142/96 mmHg  Pulse 103  Temp(Src) 98 F (36.7 C) (Oral)  Wt  196 lb (88.905 kg)  SpO2 97%  BP Readings from Last 3 Encounters:  11/04/14 142/96  10/21/14 138/84  06/30/14 150/92    Wt Readings from Last 3 Encounters:  11/04/14 196 lb (88.905 kg)  10/21/14 205 lb (92.987 kg)  06/30/14 208 lb (94.348 kg)    Physical Exam  Constitutional: He is oriented to person, place, and time. He appears well-developed. No distress.  NAD  HENT:  Mouth/Throat: Oropharynx is clear and moist.  Eyes: Conjunctivae are normal. Pupils are equal, round, and reactive to light.  Neck: Normal range of motion. No JVD present. No thyromegaly present.  Cardiovascular: Normal rate, regular rhythm, normal heart sounds and intact distal pulses.  Exam reveals no gallop and no friction rub.   No murmur heard. Pulmonary/Chest: Effort normal and breath sounds normal. No respiratory distress. He has no wheezes. He has no rales. He exhibits no tenderness.  Abdominal: Soft. Bowel sounds are normal. He exhibits distension. He exhibits no mass. There is tenderness. There is no rebound and no guarding.  Musculoskeletal: Normal range of motion. He exhibits no edema or tenderness.  Lymphadenopathy:    He has no cervical adenopathy.  Neurological: He is alert and oriented to person, place, and time. He has normal reflexes. No cranial nerve deficit. He exhibits normal muscle tone. He displays a negative Romberg sign. Coordination and gait normal.  Skin: Skin is warm and dry. No rash noted.  Psychiatric: He has a normal mood and affect. His behavior is normal. Judgment and thought content normal.  abd is ND, sensitive in the upper 1/2. No mass. No rebound.  Lab Results  Component Value Date   WBC 8.2 05/17/2012   HGB 13.7 05/17/2012   HCT 40.8 05/17/2012   PLT 155.0 05/17/2012   GLUCOSE 163* 10/13/2014   CHOL 184 09/10/2013   TRIG 321.0* 09/10/2013   HDL 36.70* 09/10/2013   LDLDIRECT 105.5 09/10/2013   LDLCALC 66 08/30/2011   ALT 21 05/17/2012   AST 19 05/17/2012   NA 136  10/13/2014   K 4.5 10/13/2014   CL 101 10/13/2014   CREATININE 1.68* 10/13/2014   BUN 29* 10/13/2014   CO2 23 10/13/2014   TSH 2.48 05/17/2012   PSA 0.91 05/17/2012   HGBA1C 7.9* 10/13/2014   MICROALBUR 0.4 03/31/2010    Dg Cervical Spine Complete  04/24/2013   CLINICAL DATA:  Severe right neck pain for 3 weeks  EXAM: CERVICAL SPINE  4+ VIEWS  COMPARISON:  None.  FINDINGS: No evidence of acute fracture or malalignment. No significant prevertebral soft tissue swelling. Mild multilevel cervical spondylosis. There is a moderate right foraminal narrowing at C5-C6 and mild right foraminal narrowing at C4-C5. Lung apices are unremarkable. Normal bony mineralization. No lytic or blastic osseous lesion.  IMPRESSION: Multilevel cervical spondylosis with moderate right foraminal narrowing  at C5-C6 and mild right foraminal narrowing at C4-C5  .   Electronically Signed   By: Jacqulynn Cadet M.D.   On: 04/24/2013 15:10    Assessment & Plan:   There are no diagnoses linked to this encounter. I am having Mr. Batch maintain his aspirin, Glucosamine-Chondroitin, Diclofenac Sodium, cholecalciferol, meloxicam, fluticasone, hydrocortisone, HYDROCORTISONE ACE (RECTAL), cetirizine, hydrOXYzine, finasteride, diazepam, triamcinolone cream, allopurinol, amLODipine, esomeprazole, rosuvastatin, trandolapril, loratadine, omeprazole, metFORMIN, Exenatide ER, and ondansetron.  No orders of the defined types were placed in this encounter.     Follow-up: No Follow-up on file.  [name], MD

## 2014-11-05 LAB — ANA: ANA: NEGATIVE

## 2014-11-16 ENCOUNTER — Telehealth: Payer: Self-pay | Admitting: Internal Medicine

## 2014-11-16 MED ORDER — GLUCOSE BLOOD VI STRP: ORAL_STRIP | Status: DC

## 2014-11-16 NOTE — Telephone Encounter (Signed)
Patient is requesting a prescription for Freestyle light test strips sent to [facility]

## 2014-11-16 NOTE — Telephone Encounter (Signed)
Done. See meds.  

## 2014-11-27 ENCOUNTER — Ambulatory Visit (INDEPENDENT_AMBULATORY_CARE_PROVIDER_SITE_OTHER): Payer: PPO | Admitting: Internal Medicine

## 2014-11-27 ENCOUNTER — Encounter: Payer: Self-pay | Admitting: Internal Medicine

## 2014-11-27 VITALS — BP 138/88 | HR 92 | Wt 188.0 lb

## 2014-11-27 DIAGNOSIS — N182 Chronic kidney disease, stage 2 (mild): Secondary | ICD-10-CM | POA: Diagnosis not present

## 2014-11-27 DIAGNOSIS — R1010 Upper abdominal pain, unspecified: Secondary | ICD-10-CM

## 2014-11-27 DIAGNOSIS — R140 Abdominal distension (gaseous): Secondary | ICD-10-CM

## 2014-11-27 DIAGNOSIS — E1122 Type 2 diabetes mellitus with diabetic chronic kidney disease: Secondary | ICD-10-CM

## 2014-11-27 MED ORDER — METFORMIN HCL 500 MG PO TABS: 500.0000 mg | ORAL_TABLET | Freq: Two times a day (BID) | ORAL | Status: DC

## 2014-11-27 NOTE — Progress Notes (Signed)
Pre visit review using our clinic review tool, if applicable. No additional management support is needed unless otherwise documented below in the visit note. 

## 2014-11-27 NOTE — Assessment & Plan Note (Signed)
9/16 upper half - Trulicity induced pancreatitis, abd pain - resolved

## 2014-11-27 NOTE — Assessment & Plan Note (Signed)
D/c Kombigylze XR. Metformin 841 mg bid 2/82 Trulicity induced pancreatitis, abd pain - resolved

## 2014-11-27 NOTE — Progress Notes (Signed)
Subjective:  Patient ID: Douglas Richards, male    DOB: August 14, 1945  Age: 69 y.o. MRN: [id number]  CC: No chief complaint on file.   HPI Douglas Richards presents for Trulicity induced pancreatitis, abd pain - resolved f/u. DM f/up - better diet  Outpatient Prescriptions Prior to Visit  Medication Sig Dispense Refill  . allopurinol (ZYLOPRIM) 300 MG tablet Take 1 tablet (300 mg total) by mouth daily. 90 tablet 3  . amLODipine (NORVASC) 5 MG tablet Take 1 tablet (5 mg total) by mouth daily. 90 tablet 3  . aspirin 325 MG tablet Take 325 mg by mouth daily.      . cetirizine (ZYRTEC) 10 MG tablet Take 1 tablet (10 mg total) by mouth daily. 30 tablet 1  . Cholecalciferol (VITAMIN D3) 1000 UNITS tablet Take 1,000 Units by mouth daily.      . diazepam (VALIUM) 5 MG tablet Take 0.5-1 tablets (2.5-5 mg total) by mouth every 12 (twelve) hours as needed for muscle spasms. 60 tablet 1  . Diclofenac Sodium 1.5 % SOLN Place onto the skin 3 (three) times daily.      Marland Kitchen esomeprazole (NEXIUM) 40 MG capsule Take 1 capsule (40 mg total) by mouth daily. 90 capsule 3  . finasteride (PROSCAR) 5 MG tablet Take 1 tablet (5 mg total) by mouth daily. 90 tablet 3  . fluticasone (FLONASE) 50 MCG/ACT nasal spray Place 2 sprays into the nose daily. 48 g g  . Glucosamine-Chondroitin ([facility] GLUCOSAMINE-CHONDROITIN) 750-600 MG TABS Take by mouth 2 (two) times daily.      Marland Kitchen glucose blood test strip Use as instructed 100 each 3  . hydrocortisone (ANUSOL-HC) 2.5 % rectal cream Place rectally 2 (two) times daily as needed. 90 g 3  . HYDROCORTISONE ACE, RECTAL, (HEMRIL-30) 30 MG SUPP Place 1 suppository (30 mg total) rectally 2 (two) times daily. 60 each 1  . hydrOXYzine (ATARAX/VISTARIL) 25 MG tablet Take 1-2 tablets (25-50 mg total) by mouth every 8 (eight) hours as needed for itching. 60 tablet 1  . loratadine (CLARITIN) 10 MG tablet Take 10 mg by mouth daily.    . meloxicam (MOBIC) 15 MG tablet Take 1 tablet (15 mg total) by  mouth daily. 90 tablet 3  . metFORMIN (GLUCOPHAGE) 500 MG tablet Take 1 tablet (500 mg total) by mouth 2 (two) times daily with a meal. 60 tablet 11  . omeprazole (PRILOSEC) 20 MG capsule Take 1 capsule (20 mg total) by mouth daily. 90 capsule 3  . ondansetron (ZOFRAN) 4 MG tablet Take 1 tablet (4 mg total) by mouth every 8 (eight) hours as needed for nausea or vomiting. 20 tablet 1  . rosuvastatin (CRESTOR) 10 MG tablet Take 1 tablet (10 mg total) by mouth daily. (Patient taking differently: Take 5 mg by mouth 2 (two) times a week. ) 90 tablet 3  . trandolapril (MAVIK) 4 MG tablet Take 1 tablet (4 mg total) by mouth daily. 90 tablet 3  . triamcinolone cream (KENALOG) 0.1 % Apply 1 application topically 2 (two) times daily. 90 g 3   No facility-administered medications prior to visit.    ROS Review of Systems  Objective:  BP 138/88 mmHg  Pulse 92  Wt 188 lb (85.276 kg)  SpO2 98%  BP Readings from Last 3 Encounters:  11/27/14 138/88  11/04/14 142/96  10/21/14 138/84    Wt Readings from Last 3 Encounters:  11/27/14 188 lb (85.276 kg)  11/04/14 196 lb (88.905 kg)  10/21/14  205 lb (92.987 kg)    Physical Exam  Lab Results  Component Value Date   WBC 12.9* 11/04/2014   HGB 14.3 11/04/2014   HCT 42.8 11/04/2014   PLT 210.0 11/04/2014   GLUCOSE 138* 11/04/2014   CHOL 184 09/10/2013   TRIG 321.0* 09/10/2013   HDL 36.70* 09/10/2013   LDLDIRECT 105.5 09/10/2013   LDLCALC 66 08/30/2011   ALT 16 11/04/2014   AST 17 11/04/2014   NA 135 11/04/2014   K 4.7 11/04/2014   CL 100 11/04/2014   CREATININE 1.63* 11/04/2014   BUN 18 11/04/2014   CO2 26 11/04/2014   TSH 2.48 05/17/2012   PSA 0.91 05/17/2012   HGBA1C 7.9* 10/13/2014   MICROALBUR 0.4 03/31/2010    Ct Abdomen Pelvis Wo Contrast  11/04/2014  CLINICAL DATA:  Pain and bloating mid abdomen. Symptoms for several days, with low-grade fever. EXAM: CT ABDOMEN AND PELVIS WITHOUT CONTRAST TECHNIQUE: Multidetector CT imaging  of the abdomen and pelvis was performed following the standard protocol without IV contrast. COMPARISON:  None. FINDINGS: No intrarenal or proximal ureteral calculi on either side. No evidence of hydronephrosis or other secondary signs of upper urinary tract obstruction. Within limits of unenhanced technique, no renal masses. Kidneys appear small with irregular outline suggesting chronic scarring. Atrophic pancreas. Mildly distended gallbladder, possible sludge. No definite stones. Again, within limits of unenhanced technique, remaining visualized upper abdomen unremarkable. Visualized extreme lung bases clear. No distal ureteral calculi on either side. Appendix identified and normal. Mild prostatic enlargement without bladder obstruction. Moderately distended colon which is filled with fecal material. There is no visible fecal impaction in the rectum. Unremarkable appearing diverticulosis without signs wall thickening or diverticulitis. IMPRESSION: No evidence for bowel obstruction or colitis. Suspected constipation. No definite fecal impaction. No evidence for diverticulitis. Atrophic pancreas. Mildly distended gallbladder with possible sludge. Electronically Signed   By: Staci Righter M.D.   On: 11/04/2014 16:41    Assessment & Plan:   There are no diagnoses linked to this encounter. I am having Mr. Bumbaugh maintain his aspirin, Glucosamine-Chondroitin, Diclofenac Sodium, cholecalciferol, meloxicam, fluticasone, hydrocortisone, HYDROCORTISONE ACE (RECTAL), cetirizine, hydrOXYzine, finasteride, diazepam, triamcinolone cream, allopurinol, amLODipine, esomeprazole, rosuvastatin, trandolapril, loratadine, omeprazole, metFORMIN, ondansetron, and glucose blood.  No orders of the defined types were placed in this encounter.     Follow-up: No Follow-up on file.  [name], MD

## 2015-01-21 ENCOUNTER — Other Ambulatory Visit (INDEPENDENT_AMBULATORY_CARE_PROVIDER_SITE_OTHER): Payer: PPO

## 2015-01-21 DIAGNOSIS — E1122 Type 2 diabetes mellitus with diabetic chronic kidney disease: Secondary | ICD-10-CM

## 2015-01-21 DIAGNOSIS — N182 Chronic kidney disease, stage 2 (mild): Secondary | ICD-10-CM | POA: Diagnosis not present

## 2015-01-21 LAB — BASIC METABOLIC PANEL
BUN: 25 mg/dL — ABNORMAL HIGH (ref 6–23)
CHLORIDE: 100 meq/L (ref 96–112)
CO2: 28 mEq/L (ref 19–32)
CREATININE: 1.56 mg/dL — AB (ref 0.40–1.50)
Calcium: 9.4 mg/dL (ref 8.4–10.5)
GFR: 47.09 mL/min — ABNORMAL LOW (ref >60.00)
Glucose, Bld: 129 mg/dL — ABNORMAL HIGH (ref 70–99)
POTASSIUM: 4.3 meq/L (ref 3.5–5.1)
Sodium: 136 mEq/L (ref 135–145)

## 2015-01-21 LAB — HEMOGLOBIN A1C: HEMOGLOBIN A1C: 6.9 % — AB (ref 4.6–6.5)

## 2015-01-26 ENCOUNTER — Ambulatory Visit (INDEPENDENT_AMBULATORY_CARE_PROVIDER_SITE_OTHER): Payer: PPO | Admitting: Internal Medicine

## 2015-01-26 ENCOUNTER — Encounter: Payer: Self-pay | Admitting: Internal Medicine

## 2015-01-26 VITALS — BP 138/84 | HR 90 | Wt 181.0 lb

## 2015-01-26 DIAGNOSIS — I10 Essential (primary) hypertension: Secondary | ICD-10-CM | POA: Diagnosis not present

## 2015-01-26 DIAGNOSIS — N182 Chronic kidney disease, stage 2 (mild): Secondary | ICD-10-CM

## 2015-01-26 DIAGNOSIS — E785 Hyperlipidemia, unspecified: Secondary | ICD-10-CM | POA: Diagnosis not present

## 2015-01-26 DIAGNOSIS — E1122 Type 2 diabetes mellitus with diabetic chronic kidney disease: Secondary | ICD-10-CM

## 2015-01-26 NOTE — Progress Notes (Signed)
Subjective:  Patient ID: VIVAN DEGAETANO, male    DOB: 10/01/1945  Age: 69 y.o. MRN: [id number]  CC: No chief complaint on file.   HPI RANSON HOLTHAUS presents for gout, HTN, DM f/u. On diet  Outpatient Prescriptions Prior to Visit  Medication Sig Dispense Refill  . allopurinol (ZYLOPRIM) 300 MG tablet Take 1 tablet (300 mg total) by mouth daily. 90 tablet 3  . amLODipine (NORVASC) 5 MG tablet Take 1 tablet (5 mg total) by mouth daily. 90 tablet 3  . aspirin 325 MG tablet Take 325 mg by mouth daily.      . cetirizine (ZYRTEC) 10 MG tablet Take 1 tablet (10 mg total) by mouth daily. 30 tablet 1  . Cholecalciferol (VITAMIN D3) 1000 UNITS tablet Take 1,000 Units by mouth daily.      . diazepam (VALIUM) 5 MG tablet Take 0.5-1 tablets (2.5-5 mg total) by mouth every 12 (twelve) hours as needed for muscle spasms. 60 tablet 1  . Diclofenac Sodium 1.5 % SOLN Place onto the skin 3 (three) times daily.      Marland Kitchen esomeprazole (NEXIUM) 40 MG capsule Take 1 capsule (40 mg total) by mouth daily. 90 capsule 3  . finasteride (PROSCAR) 5 MG tablet Take 1 tablet (5 mg total) by mouth daily. 90 tablet 3  . fluticasone (FLONASE) 50 MCG/ACT nasal spray Place 2 sprays into the nose daily. 48 g g  . Glucosamine-Chondroitin ([facility] GLUCOSAMINE-CHONDROITIN) 750-600 MG TABS Take by mouth 2 (two) times daily.      Marland Kitchen glucose blood test strip Use as instructed 100 each 3  . hydrocortisone (ANUSOL-HC) 2.5 % rectal cream Place rectally 2 (two) times daily as needed. 90 g 3  . HYDROCORTISONE ACE, RECTAL, (HEMRIL-30) 30 MG SUPP Place 1 suppository (30 mg total) rectally 2 (two) times daily. 60 each 1  . hydrOXYzine (ATARAX/VISTARIL) 25 MG tablet Take 1-2 tablets (25-50 mg total) by mouth every 8 (eight) hours as needed for itching. 60 tablet 1  . loratadine (CLARITIN) 10 MG tablet Take 10 mg by mouth daily.    . meloxicam (MOBIC) 15 MG tablet Take 1 tablet (15 mg total) by mouth daily. 90 tablet 3  . metFORMIN (GLUCOPHAGE) 500  MG tablet Take 1 tablet (500 mg total) by mouth 2 (two) times daily with a meal. 60 tablet 11  . omeprazole (PRILOSEC) 20 MG capsule Take 1 capsule (20 mg total) by mouth daily. 90 capsule 3  . ondansetron (ZOFRAN) 4 MG tablet Take 1 tablet (4 mg total) by mouth every 8 (eight) hours as needed for nausea or vomiting. 20 tablet 1  . rosuvastatin (CRESTOR) 10 MG tablet Take 1 tablet (10 mg total) by mouth daily. (Patient taking differently: Take 5 mg by mouth 2 (two) times a week. ) 90 tablet 3  . trandolapril (MAVIK) 4 MG tablet Take 1 tablet (4 mg total) by mouth daily. 90 tablet 3  . triamcinolone cream (KENALOG) 0.1 % Apply 1 application topically 2 (two) times daily. 90 g 3   No facility-administered medications prior to visit.    ROS Review of Systems  Constitutional: Negative for appetite change, fatigue and unexpected weight change.  HENT: Negative for congestion, nosebleeds, sneezing, sore throat and trouble swallowing.   Eyes: Negative for itching and visual disturbance.  Respiratory: Negative for cough.   Cardiovascular: Negative for chest pain, palpitations and leg swelling.  Gastrointestinal: Negative for nausea, diarrhea, blood in stool and abdominal distention.  Genitourinary: Negative for  frequency and hematuria.  Musculoskeletal: Negative for back pain, joint swelling, gait problem and neck pain.  Skin: Negative for rash.  Neurological: Negative for dizziness, tremors, speech difficulty and weakness.  Psychiatric/Behavioral: Negative for sleep disturbance, dysphoric mood and agitation. The patient is not nervous/anxious.     Objective:  BP 138/84 mmHg  Pulse 90  Wt 181 lb (82.101 kg)  SpO2 97%  BP Readings from Last 3 Encounters:  01/26/15 138/84  11/27/14 138/88  11/04/14 142/96    Wt Readings from Last 3 Encounters:  01/26/15 181 lb (82.101 kg)  11/27/14 188 lb (85.276 kg)  11/04/14 196 lb (88.905 kg)    Physical Exam  Constitutional: He is oriented to  person, place, and time. He appears well-developed. No distress.  NAD  HENT:  Mouth/Throat: Oropharynx is clear and moist.  Eyes: Conjunctivae are normal. Pupils are equal, round, and reactive to light.  Neck: Normal range of motion. No JVD present. No thyromegaly present.  Cardiovascular: Normal rate, regular rhythm, normal heart sounds and intact distal pulses.  Exam reveals no gallop and no friction rub.   No murmur heard. Pulmonary/Chest: Effort normal and breath sounds normal. No respiratory distress. He has no wheezes. He has no rales. He exhibits no tenderness.  Abdominal: Soft. Bowel sounds are normal. He exhibits no distension and no mass. There is no tenderness. There is no rebound and no guarding.  Musculoskeletal: Normal range of motion. He exhibits no edema or tenderness.  Lymphadenopathy:    He has no cervical adenopathy.  Neurological: He is alert and oriented to person, place, and time. He has normal reflexes. No cranial nerve deficit. He exhibits normal muscle tone. He displays a negative Romberg sign. Coordination and gait normal.  Skin: Skin is warm and dry. No rash noted.  Psychiatric: He has a normal mood and affect. His behavior is normal. Judgment and thought content normal.    Lab Results  Component Value Date   WBC 12.9* 11/04/2014   HGB 14.3 11/04/2014   HCT 42.8 11/04/2014   PLT 210.0 11/04/2014   GLUCOSE 129* 01/21/2015   CHOL 184 09/10/2013   TRIG 321.0* 09/10/2013   HDL 36.70* 09/10/2013   LDLDIRECT 105.5 09/10/2013   LDLCALC 66 08/30/2011   ALT 16 11/04/2014   AST 17 11/04/2014   NA 136 01/21/2015   K 4.3 01/21/2015   CL 100 01/21/2015   CREATININE 1.56* 01/21/2015   BUN 25* 01/21/2015   CO2 28 01/21/2015   TSH 2.48 05/17/2012   PSA 0.91 05/17/2012   HGBA1C 6.9* 01/21/2015   MICROALBUR 0.4 03/31/2010    Ct Abdomen Pelvis Wo Contrast  11/04/2014  CLINICAL DATA:  Pain and bloating mid abdomen. Symptoms for several days, with low-grade fever.  EXAM: CT ABDOMEN AND PELVIS WITHOUT CONTRAST TECHNIQUE: Multidetector CT imaging of the abdomen and pelvis was performed following the standard protocol without IV contrast. COMPARISON:  None. FINDINGS: No intrarenal or proximal ureteral calculi on either side. No evidence of hydronephrosis or other secondary signs of upper urinary tract obstruction. Within limits of unenhanced technique, no renal masses. Kidneys appear small with irregular outline suggesting chronic scarring. Atrophic pancreas. Mildly distended gallbladder, possible sludge. No definite stones. Again, within limits of unenhanced technique, remaining visualized upper abdomen unremarkable. Visualized extreme lung bases clear. No distal ureteral calculi on either side. Appendix identified and normal. Mild prostatic enlargement without bladder obstruction. Moderately distended colon which is filled with fecal material. There is no visible fecal impaction in the  rectum. Unremarkable appearing diverticulosis without signs wall thickening or diverticulitis. IMPRESSION: No evidence for bowel obstruction or colitis. Suspected constipation. No definite fecal impaction. No evidence for diverticulitis. Atrophic pancreas. Mildly distended gallbladder with possible sludge. Electronically Signed   By: Staci Righter M.D.   On: 11/04/2014 16:41    Assessment & Plan:   Diagnoses and all orders for this visit:  Essential hypertension  Type 2 diabetes mellitus with stage 2 chronic kidney disease, without long-term current use of insulin (HCC)  Dyslipidemia   I am having Mr. Grainer maintain his aspirin, Glucosamine-Chondroitin, Diclofenac Sodium, cholecalciferol, meloxicam, fluticasone, hydrocortisone, HYDROCORTISONE ACE (RECTAL), cetirizine, hydrOXYzine, finasteride, diazepam, triamcinolone cream, allopurinol, amLODipine, esomeprazole, rosuvastatin, trandolapril, loratadine, omeprazole, ondansetron, glucose blood, and metFORMIN.  No orders of the defined  types were placed in this encounter.     Follow-up: Return in about 4 months (around 05/27/2015) for Wellness Exam.  [name], MD

## 2015-01-26 NOTE — Progress Notes (Signed)
Pre visit review using our clinic review tool, if applicable. No additional management support is needed unless otherwise documented below in the visit note. 

## 2015-01-26 NOTE — Assessment & Plan Note (Signed)
On Crestor 

## 2015-01-26 NOTE — Assessment & Plan Note (Addendum)
Off Kombigylze XR. Metformin 500 mg bid

## 2015-01-26 NOTE — Assessment & Plan Note (Signed)
Amlodipine.

## 2015-02-07 MED FILL — metFORMIN HCL 500 MG TABS: 500 | 30 days supply | Qty: 60 | Fill #2

## 2015-02-08 ENCOUNTER — Other Ambulatory Visit: Payer: Self-pay | Admitting: Internal Medicine

## 2015-02-09 MED FILL — AMLODIPINE BESYLATE 5 MG TA: 5 | 90 days supply | Qty: 90 | Fill #0

## 2015-03-07 MED FILL — metFORMIN HCL 500 MG TABS: 500 | 30 days supply | Qty: 60 | Fill #3

## 2015-03-17 ENCOUNTER — Other Ambulatory Visit: Payer: Self-pay | Admitting: Internal Medicine

## 2015-03-17 MED FILL — TRANDOLAPRIL 4 MG TABLET: 4 | 90 days supply | Qty: 90 | Fill #0

## 2015-03-30 DIAGNOSIS — H35373 Puckering of macula, bilateral: Secondary | ICD-10-CM | POA: Diagnosis not present

## 2015-03-30 DIAGNOSIS — H2512 Age-related nuclear cataract, left eye: Secondary | ICD-10-CM | POA: Diagnosis not present

## 2015-03-30 DIAGNOSIS — H43811 Vitreous degeneration, right eye: Secondary | ICD-10-CM | POA: Diagnosis not present

## 2015-03-30 DIAGNOSIS — H18413 Arcus senilis, bilateral: Secondary | ICD-10-CM | POA: Diagnosis not present

## 2015-03-30 DIAGNOSIS — Z961 Presence of intraocular lens: Secondary | ICD-10-CM | POA: Diagnosis not present

## 2015-04-04 MED FILL — metFORMIN HCL 500 MG TABS: 500 | 30 days supply | Qty: 60 | Fill #4

## 2015-04-05 ENCOUNTER — Other Ambulatory Visit: Payer: Self-pay | Admitting: Internal Medicine

## 2015-04-05 MED FILL — ALLOPURINOL 300 MG TABLET: 300 | 90 days supply | Qty: 90 | Fill #0

## 2015-04-12 ENCOUNTER — Other Ambulatory Visit: Payer: Self-pay | Admitting: Internal Medicine

## 2015-04-12 MED FILL — ROSUVASTATIN CALCIUM 10 MG: 10 | 90 days supply | Qty: 90 | Fill #0

## 2015-04-25 MED FILL — FREESTYLE LITE TEST STRIP: 50 days supply | Qty: 100 | Fill #2

## 2015-04-28 ENCOUNTER — Ambulatory Visit (INDEPENDENT_AMBULATORY_CARE_PROVIDER_SITE_OTHER): Payer: PPO

## 2015-04-28 VITALS — Ht 69.0 in | Wt 177.5 lb

## 2015-04-28 DIAGNOSIS — Z0000 Encounter for general adult medical examination without abnormal findings: Secondary | ICD-10-CM | POA: Diagnosis not present

## 2015-04-28 MED FILL — metFORMIN HCL 500 MG TABS: 500 | 30 days supply | Qty: 60 | Fill #5

## 2015-04-28 NOTE — Progress Notes (Addendum)
Subjective:   Douglas Richards is a 70 y.o. male who presents for Medicare Annual/Subsequent preventive examination.  Review of Systems:  HRA assessment completed during visit; Josey, Schoff  The Patient was informed that this wellness visit is to identify risk and educate on how to reduce risk for increase disease through lifestyle changes.   ROS deferred to CPE exam with physician  [place] x 6 years; TEFL teacher for life; nice career Care for dtr now with chronic migraines Now has a hobby at home and refurbishes items or other  Medical and family hx Mother; COPD; Stroke; HTN;  Father DM; COPD; Etoh; Stroke; HTN;  Sister HD Brother HD  Tobacco; never smoked Occasional Etoh use; seldom  Medical issues  OA/moves well; no c/o today HTN checks at home 129/70 at home; states he checks his BP at home regularly and it is always good; states he has "white coat syndrome" and bP is always elevated when here.  Hyperlipidemia (Lipids 09/2013; cho 184; Trig 321; HDL 36; LDL 105) will repeat blood work; States he has lost approx 35 lbs since this value  DM 6.9 on 01/2015 from 7.9 in [place] in last summer; weight up to 210; (lost 35 lbs) stated trulicity made him very sick;  had pancreatitis;  Metformin 2000 a day and decided to come off sugar;  Right knee still hurts some; Feels great;  The weight came off and now he has stabilized at 175 approx;   BMI: 26.1  Diet;  Breakfast; washi crackers and peanut butter; flax seed in washi and takes care of acid reflux Sleeps to 9am/ stays up and reads at hs 2 to 3 pm spouse and he will both eat; full meal 10 - 11 am; peanut butter crackers; natural enzyme; peanuts;  Cuts back on fruits;   Exercise;  Very active; enjoys walking; but now active and enjoys craftsman like work  SAFETY; [place] community;  Single family home Safety reviewed for the home;  Removal of clutter clearing paths through the home,  Bathroom safety; shower  in tub  Community safety; yes Smoke detectors yes Firearms safety / keep in safe place if they exist Driving accidents and seatbelt/ no accidents; wears seatbelt Sun protection/ wear when out in sun a lot Stressors / generally family concerns but manageable  Medication review/ Not taking annusol; 1 to 2 tbsp mineral oil worked   Fall assessment; no Gait assessment; get up and go is normal  Mobilization and Functional losses in the last year/ no  Sleep patterns; adequate  Urinary or fecal incontinence reviewed/  No bowel problems; slow flow;uses saw palmetto  Will discuss with Dr. Alain Marion at next visit   Counseling: Foot exam  Hep C/ discussed due to [place] risk  Colonoscopy; 01/2014 fup in 5 years/ Dr. Henrene Pastor  EKG: 05/2012 Hearing: tinnitus; has not had it checked  Ophthalmology exam; last eye exam Feb; normal  One cataract/ floaters Immunizations: zostavax;   Advanced Directive; Health advice or referrals Will continue to keep an eye on his BP at home   Current Care Team reviewed and updated        Objective:    Vitals: There were no vitals taken for this visit.  There is no weight on file to calculate BMI.  Tobacco History  Smoking status  . Never Smoker   Smokeless tobacco  . Never Used     Counseling given: Not Answered   Past Medical History  Diagnosis Date  .  Herpes zoster without mention of complication   . Anal fissure   . Diverticulosis of colon (without mention of hemorrhage)   . Osteoarthrosis, unspecified whether generalized or localized, unspecified site   . Other abnormal glucose   . Allergic rhinitis   . Hypertension   . Hyperlipidemia   . Gout, unspecified   . Personal history of colonic polyps   . Diabetes mellitus 2011    type II  . Allergy   . Cataract     right eye   . GERD (gastroesophageal reflux disease)    Past Surgical History  Procedure Laterality Date  . Lipoma excision      from the neck  . Tonsillectomy      . Hemorrhoid surgery    . Polypectomy    . Colonoscopy    . Cataract extraction Right    Family History  Problem Relation Age of Onset  . Hypertension      fam hx  . COPD Mother   . Stroke Mother   . Hypertension Mother   . Diabetes Father   . COPD Father   . Alcohol abuse Father   . Stroke Father   . Hypertension Father   . Heart disease Sister   . Alcohol abuse Brother   . Arthritis Brother     lupus  . Heart disease Brother   . Colon cancer Neg Hx    History  Sexual Activity  . Sexual Activity: Yes    Outpatient Encounter Prescriptions as of 04/28/2015  Medication Sig  . allopurinol (ZYLOPRIM) 300 MG tablet Take 1 tablet (300 mg total) by mouth daily.  Marland Kitchen amLODipine (NORVASC) 5 MG tablet TAKE 1 TABLET BY MOUTH ONCE DAILY  . aspirin 325 MG tablet Take 325 mg by mouth daily.    . cetirizine (ZYRTEC) 10 MG tablet Take 1 tablet (10 mg total) by mouth daily.  . Cholecalciferol (VITAMIN D3) 1000 UNITS tablet Take 1,000 Units by mouth daily.    . CRESTOR 10 MG tablet TAKE 1 TABLET BY MOUTH ONCE DAILY  . diazepam (VALIUM) 5 MG tablet Take 0.5-1 tablets (2.5-5 mg total) by mouth every 12 (twelve) hours as needed for muscle spasms.  . Diclofenac Sodium 1.5 % SOLN Place onto the skin 3 (three) times daily.    Marland Kitchen esomeprazole (NEXIUM) 40 MG capsule Take 1 capsule (40 mg total) by mouth daily.  . finasteride (PROSCAR) 5 MG tablet Take 1 tablet (5 mg total) by mouth daily.  . fluticasone (FLONASE) 50 MCG/ACT nasal spray Place 2 sprays into the nose daily.  . Glucosamine-Chondroitin ([facility] GLUCOSAMINE-CHONDROITIN) 750-600 MG TABS Take by mouth 2 (two) times daily.    Marland Kitchen glucose blood test strip Use as instructed  . hydrocortisone (ANUSOL-HC) 2.5 % rectal cream Place rectally 2 (two) times daily as needed.  Marland Kitchen HYDROCORTISONE ACE, RECTAL, (HEMRIL-30) 30 MG SUPP Place 1 suppository (30 mg total) rectally 2 (two) times daily.  . hydrOXYzine (ATARAX/VISTARIL) 25 MG tablet Take 1-2 tablets  (25-50 mg total) by mouth every 8 (eight) hours as needed for itching.  . loratadine (CLARITIN) 10 MG tablet Take 10 mg by mouth daily.  . meloxicam (MOBIC) 15 MG tablet Take 1 tablet (15 mg total) by mouth daily.  . metFORMIN (GLUCOPHAGE) 500 MG tablet Take 1 tablet (500 mg total) by mouth 2 (two) times daily with a meal.  . omeprazole (PRILOSEC) 20 MG capsule Take 1 capsule (20 mg total) by mouth daily.  . ondansetron (ZOFRAN) 4 MG  tablet Take 1 tablet (4 mg total) by mouth every 8 (eight) hours as needed for nausea or vomiting.  . trandolapril (MAVIK) 4 MG tablet TAKE 1 TABLET BY MOUTH ONCE DAILY  . triamcinolone cream (KENALOG) 0.1 % Apply 1 application topically 2 (two) times daily.   No facility-administered encounter medications on file as of 04/28/2015.    Activities of Daily Living No flowsheet data found.  Patient Care Team: Cassandria Anger, MD as PCP - General   Assessment:     Exercise Activities and Dietary recommendations   Monitor BP at home and continue the great job he is doing with his diet and BS control.  Goals    None     Fall Risk Fall Risk  06/30/2014  Falls in the past year? No   Depression Screen PHQ 2/9 Scores 06/30/2014  PHQ - 2 Score 0    Cognitive Testing No flowsheet data found.  Reading; math skills tested every day; No issues noted  Ad8 score 0   Immunization History  Administered Date(s) Administered  . Influenza Whole 11/28/2005, 11/27/2008  . Influenza,inj,Quad PF,36+ Mos 10/28/2013, 10/21/2014  . Influenza-Unspecified 01/06/2013  . Pneumococcal Conjugate-13 01/13/2014  . Pneumococcal Polysaccharide-23 06/07/2011  . Td 07/23/2009   Screening Tests Health Maintenance  Topic Date Due  . Hepatitis C Screening  [birth year]  . ZOSTAVAX  09/27/2005  . FOOT EXAM  09/18/2014  . OPHTHALMOLOGY EXAM  03/25/2015  . HEMOGLOBIN A1C  07/22/2015  . INFLUENZA VACCINE  09/07/2015  . COLONOSCOPY  01/16/2019  . TETANUS/TDAP  07/24/2019  .  PNA vac Low Risk Adult  Completed      Plan:     Discussed Hep c and will have this drawn at next blood draw; risk [place] vet  Repeat colonoscopy in 5 years; 01/2019  Last eye exam in Feb and was normal  Discussed Shingles; will check on coverage and consider; had one episode of shingles and was told he is at risk for repeat episode; will discuss with Dr. Alain Marion.   During the course of the visit the patient was educated and counseled about the following appropriate screening and preventive services:   Vaccines to include Pneumoccal, Influenza, Hepatitis B, Td, Zostavax, HCV/ may consider shingles  Electrocardiogram/ 05/2012  Cardiovascular Disease/ BP good at home; deferred today; Weight loss successful; BS control very good; leads active life  Colorectal cancer screening/ due 01/2019  Diabetes screening/ /a1c 6.9 from 7.9 via weight loss  Prostate Cancer Screening/ deferred; may have enlarged prostate; will discuss with Plotnikov next visit   Glaucoma screening/ 03/2015  Nutrition counseling / excellent with limited sugar  Smoking cessation counseling/ n/a  Patient Instructions (the written plan) was given to the patient.    Wynetta Fines, RN  04/28/2015   Medical screening examination/treatment/procedure(s) were performed by non-physician practitioner and as supervising physician I was immediately available for consultation/collaboration. I agree with above. [name], MD

## 2015-04-28 NOTE — Patient Instructions (Addendum)
Douglas Richards , Thank you for taking time to come for your Medicare Wellness Visit. I appreciate your ongoing commitment to your health goals. Please review the following plan we discussed and let me know if I can assist you in the future.   Keep going and enjoy your life.   These are the goals we discussed: Goals    . patient     Wants to come off of his medications!!!        This is a list of the screening recommended for you and due dates:  Health Maintenance  Topic Date Due  .  Hepatitis C: One time screening is recommended by Center for Disease Control  (CDC) for  adults born from [birth year] through 1965.   06/23/1945  . Shingles Vaccine  09/27/2005  . Complete foot exam   09/18/2014  . Eye exam for diabetics  03/25/2015  . Hemoglobin A1C  07/22/2015  . Flu Shot  09/07/2015  . Colon Cancer Screening  01/16/2019  . Tetanus Vaccine  07/24/2019  . Pneumonia vaccines  Completed    Given for information only:  Benign Prostatic Hypertrophy The prostate gland is part of the reproductive system of men. A normal prostate is about the size and shape of a walnut. The prostate gland produces a fluid that is mixed with sperm to make semen. This gland surrounds the urethra and is located in front of the rectum and just below the bladder. The bladder is where urine is stored. The urethra is the tube through which urine passes from the bladder to get out of the body. The prostate grows as a man ages. An enlarged prostate not caused by cancer is called benign prostatic hypertrophy (BPH). An enlarged prostate can press on the urethra. This can make it harder to pass urine. In the early stages of enlargement, the bladder can get by with a narrowed urethra by forcing the urine through. If the problem gets worse, medical or surgical treatment may be required.  This condition should be followed by your health care provider. The accumulation of urine in the bladder can cause infection. Back pressure and  infection can progress to bladder damage and kidney (renal) failure. If needed, your health care provider may refer you to a specialist in kidney and prostate disease (urologist). CAUSES  BPH is a common health problem in men older than 50 years. This condition is a normal part of aging. However, not all men will develop problems from this condition. If the enlargement grows away from the urethra, then there will not be any compression of the urethra and resistance to urine flow.If the growth is toward the urethra and compresses it, you will experience difficulty urinating.  SYMPTOMS   Not able to completely empty your bladder.  Getting up often during the night to urinate.  Need to urinate frequently during the day.  Difficultly starting urine flow.  Decrease in size and strength of your urine stream.  Dribbling after urination.  Pain on urination (more common with infection).  Inability to pass urine. This needs immediate treatment.  The development of a urinary tract infection. DIAGNOSIS  These tests will help your health care provider understand your problem:  A thorough history and physical examination.  A urination history, with the number of times you urinate, the amounts of urine, the strength of the urine stream, and the feeling of emptiness or fullness after urinating.  A postvoid bladder scan that measures any amount of urine that may  remain in your bladder after you finish urinating.  Digital rectal exam. In a rectal exam, your health care provider checks your prostate by putting a gloved, lubricated finger into your rectum to feel the back of your prostate gland. This exam detects the size of your gland and abnormal lumps or growths.  Exam of your urine (urinalysis).  Prostate specific antigen (PSA) screening. This is a blood test used to screen for prostate cancer.  Rectal ultrasonography. This test uses sound waves to electronically produce a picture of your  prostate gland. TREATMENT  Once symptoms begin, your health care provider will monitor your condition. Of the men with this condition, one third will have symptoms that stabilize, one third will have symptoms that improve, and one third will have symptoms that progress in the first year. Mild symptoms may not need treatment. Simple observation and yearly exams may be all that is required. Medicines and surgery are options for more severe problems. Your health care provider can help you make an informed decision for what is best. Two classes of medicines are available for relief of prostate symptoms:  Medicines that shrink the prostate. This helps relieve symptoms. These medicines take time to work, and it may be months before any improvement is seen.  Uncommon side effects include problems with sexual function.  Medicines to relax the muscle of the prostate. This also relieves the obstruction by reducing any compression on the urethra.This group of medicines work much faster than those that reduce the size of the prostate gland. Usually, one can experience improvement in days to weeks..  Side effects can include dizziness, fatigue, lightheadedness, and retrograde ejaculation (diminished volume of ejaculate). Several types of surgical treatments are available for relief of prostate symptoms:  Transurethral resection of the prostate (TURP)--In this treatment, an instrument is inserted through opening at the tip of the penis. It is used to cut away pieces of the inner core of the prostate. The pieces are removed through the same opening of the penis. This removes the obstruction and helps get rid of the symptoms.  Transurethral incision (TUIP)--In this procedure, small cuts are made in the prostate. This lessens the prostates pressure on the urethra.  Transurethral microwave thermotherapy (TUMT)--This procedure uses microwaves to create heat. The heat destroys and removes a small amount of prostate  tissue.  Transurethral needle ablation (TUNA)--This is a procedure that uses radio frequencies to do the same as TUMT.  Interstitial laser coagulation (ILC)--This is a procedure that uses a laser to do the same as TUMT and TUNA.  Transurethral electrovaporization (TUVP)--This is a procedure that uses electrodes to do the same as the procedures listed above. SEEK MEDICAL CARE IF:   You develop a fever.  There is unexplained back pain.  Symptoms are not helped by medicines prescribed.  You develop side effects from the medicine you are taking.  Your urine becomes very dark or has a bad smell.  Your lower abdomen becomes distended and you have difficulty passing your urine. SEEK IMMEDIATE MEDICAL CARE IF:   You are suddenly unable to urinate. This is an emergency. You should be seen immediately.  There are large amounts of blood or clots in the urine.  Your urinary problems become unmanageable.  You develop lightheadedness, severe dizziness, or you feel faint.  You develop moderate to severe low back or flank pain.  You develop chills or fever.   This information is not intended to replace advice given to you by your health care  provider. Make sure you discuss any questions you have with your health care provider.   Document Released: 01/23/2005 Document Revised: 01/28/2013 Document Reviewed: 08/08/2012 Elsevier Interactive Patient Education 2016 [place] in the Home  Falls can cause injuries. They can happen to people of all ages. There are many things you can do to make your home safe and to help prevent falls.  WHAT CAN I DO ON THE OUTSIDE OF MY HOME?  Regularly fix the edges of walkways and driveways and fix any cracks.  Remove anything that might make you trip as you walk through a door, such as a raised step or threshold.  Trim any bushes or trees on the path to your home.  Use bright outdoor lighting.  Clear any walking paths of anything  that might make someone trip, such as rocks or tools.  Regularly check to see if handrails are loose or broken. Make sure that both sides of any steps have handrails.  Any raised decks and porches should have guardrails on the edges.  Have any leaves, snow, or ice cleared regularly.  Use sand or salt on walking paths during winter.  Clean up any spills in your garage right away. This includes oil or grease spills. WHAT CAN I DO IN THE BATHROOM?   Use night lights.  Install grab bars by the toilet and in the tub and shower. Do not use towel bars as grab bars.  Use non-skid mats or decals in the tub or shower.  If you need to sit down in the shower, use a plastic, non-slip stool.  Keep the floor dry. Clean up any water that spills on the floor as soon as it happens.  Remove soap buildup in the tub or shower regularly.  Attach bath mats securely with double-sided non-slip rug tape.  Do not have throw rugs and other things on the floor that can make you trip. WHAT CAN I DO IN THE BEDROOM?  Use night lights.  Make sure that you have a light by your bed that is easy to reach.  Do not use any sheets or blankets that are too big for your bed. They should not hang down onto the floor.  Have a firm chair that has side arms. You can use this for support while you get dressed.  Do not have throw rugs and other things on the floor that can make you trip. WHAT CAN I DO IN THE KITCHEN?  Clean up any spills right away.  Avoid walking on wet floors.  Keep items that you use a lot in easy-to-reach places.  If you need to reach something above you, use a strong step stool that has a grab bar.  Keep electrical cords out of the way.  Do not use floor polish or wax that makes floors slippery. If you must use wax, use non-skid floor wax.  Do not have throw rugs and other things on the floor that can make you trip. WHAT CAN I DO WITH MY STAIRS?  Do not leave any items on the  stairs.  Make sure that there are handrails on both sides of the stairs and use them. Fix handrails that are broken or loose. Make sure that handrails are as long as the stairways.  Check any carpeting to make sure that it is firmly attached to the stairs. Fix any carpet that is loose or worn.  Avoid having throw rugs at the top or bottom of the stairs. If  you do have throw rugs, attach them to the floor with carpet tape.  Make sure that you have a light switch at the top of the stairs and the bottom of the stairs. If you do not have them, ask someone to add them for you. WHAT ELSE CAN I DO TO HELP PREVENT FALLS?  Wear shoes that:  Do not have high heels.  Have rubber bottoms.  Are comfortable and fit you well.  Are closed at the toe. Do not wear sandals.  If you use a stepladder:  Make sure that it is fully opened. Do not climb a closed stepladder.  Make sure that both sides of the stepladder are locked into place.  Ask someone to hold it for you, if possible.  Clearly mark and make sure that you can see:  Any grab bars or handrails.  First and last steps.  Where the edge of each step is.  Use tools that help you move around (mobility aids) if they are needed. These include:  Canes.  Walkers.  Scooters.  Crutches.  Turn on the lights when you go into a dark area. Replace any light bulbs as soon as they burn out.  Set up your furniture so you have a clear path. Avoid moving your furniture around.  If any of your floors are uneven, fix them.  If there are any pets around you, be aware of where they are.  Review your medicines with your doctor. Some medicines can make you feel dizzy. This can increase your chance of falling. Ask your doctor what other things that you can do to help prevent falls.   This information is not intended to replace advice given to you by your health care provider. Make sure you discuss any questions you have with your health care  provider.   Document Released: 11/19/2008 Document Revised: 06/09/2014 Document Reviewed: 02/27/2014 Elsevier Interactive Patient Education 2016 [place] Maintenance, Male A healthy lifestyle and preventative care can promote health and wellness.  Maintain regular health, dental, and eye exams.  Eat a healthy diet. Foods like vegetables, fruits, whole grains, low-fat dairy products, and lean protein foods contain the nutrients you need and are low in calories. Decrease your intake of foods high in solid fats, added sugars, and salt. Get information about a proper diet from your health care provider, if necessary.  Regular physical exercise is one of the most important things you can do for your health. Most adults should get at least 150 minutes of moderate-intensity exercise (any activity that increases your heart rate and causes you to sweat) each week. In addition, most adults need muscle-strengthening exercises on 2 or more days a week.   Maintain a healthy weight. The body mass index (BMI) is a screening tool to identify possible weight problems. It provides an estimate of body fat based on height and weight. Your health care provider can find your BMI and can help you achieve or maintain a healthy weight. For males 20 years and older:  A BMI below 18.5 is considered underweight.  A BMI of 18.5 to 24.9 is normal.  A BMI of 25 to 29.9 is considered overweight.  A BMI of 30 and above is considered obese.  Maintain normal blood lipids and cholesterol by exercising and minimizing your intake of saturated fat. Eat a balanced diet with plenty of fruits and vegetables. Blood tests for lipids and cholesterol should begin at age 45 and be repeated every 5 years. If  your lipid or cholesterol levels are high, you are over age 23, or you are at high risk for heart disease, you may need your cholesterol levels checked more frequently.Ongoing high lipid and cholesterol levels should be  treated with medicines if diet and exercise are not working.  If you smoke, find out from your health care provider how to quit. If you do not use tobacco, do not start.  Lung cancer screening is recommended for adults aged 82-80 years who are at high risk for developing lung cancer because of a history of smoking. A yearly low-dose CT scan of the lungs is recommended for people who have at least a 30-pack-year history of smoking and are current smokers or have quit within the past 15 years. A pack year of smoking is smoking an average of 1 pack of cigarettes a day for 1 year (for example, a 30-pack-year history of smoking could mean smoking 1 pack a day for 30 years or 2 packs a day for 15 years). Yearly screening should continue until the smoker has stopped smoking for at least 15 years. Yearly screening should be stopped for people who develop a health problem that would prevent them from having lung cancer treatment.  If you choose to drink alcohol, do not have more than 2 drinks per day. One drink is considered to be 12 oz (360 mL) of beer, 5 oz (150 mL) of wine, or 1.5 oz (45 mL) of liquor.  Avoid the use of street drugs. Do not share needles with anyone. Ask for help if you need support or instructions about stopping the use of drugs.  High blood pressure causes heart disease and increases the risk of stroke. High blood pressure is more likely to develop in:  People who have blood pressure in the end of the normal range (100-139/85-89 mm Hg).  People who are overweight or obese.  People who are African American.  If you are 52-76 years of age, have your blood pressure checked every 3-5 years. If you are 44 years of age or older, have your blood pressure checked every year. You should have your blood pressure measured twice--once when you are at a hospital or clinic, and once when you are not at a hospital or clinic. Record the average of the two measurements. To check your blood pressure  when you are not at a hospital or clinic, you can use:  An automated blood pressure machine at a pharmacy.  A home blood pressure monitor.  If you are 26-52 years old, ask your health care provider if you should take aspirin to prevent heart disease.  Diabetes screening involves taking a blood sample to check your fasting blood sugar level. This should be done once every 3 years after age 81 if you are at a normal weight and without risk factors for diabetes. Testing should be considered at a younger age or be carried out more frequently if you are overweight and have at least 1 risk factor for diabetes.  Colorectal cancer can be detected and often prevented. Most routine colorectal cancer screening begins at the age of 74 and continues through age 68. However, your health care provider may recommend screening at an earlier age if you have risk factors for colon cancer. On a yearly basis, your health care provider may provide home test kits to check for hidden blood in the stool. A small camera at the end of a tube may be used to directly examine the colon (sigmoidoscopy  or colonoscopy) to detect the earliest forms of colorectal cancer. Talk to your health care provider about this at age 35 when routine screening begins. A direct exam of the colon should be repeated every 5-10 years through age 2, unless early forms of precancerous polyps or small growths are found.  People who are at an increased risk for hepatitis B should be screened for this virus. You are considered at high risk for hepatitis B if:  You were born in a country where hepatitis B occurs often. Talk with your health care provider about which countries are considered high risk.  Your parents were born in a high-risk country and you have not received a shot to protect against hepatitis B (hepatitis B vaccine).  You have HIV or AIDS.  You use needles to inject street drugs.  You live with, or have sex with, someone who has  hepatitis B.  You are a man who has sex with other men (MSM).  You get hemodialysis treatment.  You take certain medicines for conditions like cancer, organ transplantation, and autoimmune conditions.  Hepatitis C blood testing is recommended for all people born from [birth year] through 1965 and any individual with known risk factors for hepatitis C.  Healthy men should no longer receive prostate-specific antigen (PSA) blood tests as part of routine cancer screening. Talk to your health care provider about prostate cancer screening.  Testicular cancer screening is not recommended for adolescents or adult males who have no symptoms. Screening includes self-exam, a health care provider exam, and other screening tests. Consult with your health care provider about any symptoms you have or any concerns you have about testicular cancer.  Practice safe sex. Use condoms and avoid high-risk sexual practices to reduce the spread of sexually transmitted infections (STIs).  You should be screened for STIs, including gonorrhea and chlamydia if:  You are sexually active and are younger than 24 years.  You are older than 24 years, and your health care provider tells you that you are at risk for this type of infection.  Your sexual activity has changed since you were last screened, and you are at an increased risk for chlamydia or gonorrhea. Ask your health care provider if you are at risk.  If you are at risk of being infected with HIV, it is recommended that you take a prescription medicine daily to prevent HIV infection. This is called pre-exposure prophylaxis (PrEP). You are considered at risk if:  You are a man who has sex with other men (MSM).  You are a heterosexual man who is sexually active with multiple partners.  You take drugs by injection.  You are sexually active with a partner who has HIV.  Talk with your health care provider about whether you are at high risk of being infected with HIV. If  you choose to begin PrEP, you should first be tested for HIV. You should then be tested every 3 months for as long as you are taking PrEP.  Use sunscreen. Apply sunscreen liberally and repeatedly throughout the day. You should seek shade when your shadow is shorter than you. Protect yourself by wearing long sleeves, pants, a wide-brimmed hat, and sunglasses year round whenever you are outdoors.  Tell your health care provider of new moles or changes in moles, especially if there is a change in shape or color. Also, tell your health care provider if a mole is larger than the size of a pencil eraser.  A one-time screening for abdominal  aortic aneurysm (AAA) and surgical repair of large AAAs by ultrasound is recommended for men aged 12-75 years who are current or former smokers.  Stay current with your vaccines (immunizations).   This information is not intended to replace advice given to you by your health care provider. Make sure you discuss any questions you have with your health care provider.   Document Released: 07/22/2007 Document Revised: 02/13/2014 Document Reviewed: 06/20/2010 Elsevier Interactive Patient Education [employer].

## 2015-05-02 MED FILL — AMLODIPINE BESYLATE 5 MG TA: 5 | 90 days supply | Qty: 90 | Fill #1

## 2015-05-04 ENCOUNTER — Ambulatory Visit (INDEPENDENT_AMBULATORY_CARE_PROVIDER_SITE_OTHER): Payer: PPO | Admitting: Family

## 2015-05-04 ENCOUNTER — Encounter: Payer: Self-pay | Admitting: Family

## 2015-05-04 VITALS — BP 118/78 | HR 99 | Temp 98.9°F | Resp 16 | Ht 68.0 in | Wt 180.0 lb

## 2015-05-04 DIAGNOSIS — R059 Cough, unspecified: Secondary | ICD-10-CM | POA: Insufficient documentation

## 2015-05-04 DIAGNOSIS — R05 Cough: Secondary | ICD-10-CM

## 2015-05-04 MED ORDER — LEVOFLOXACIN 500 MG PO TABS: 500.0000 mg | ORAL_TABLET | Freq: Every day | ORAL | Status: DC

## 2015-05-04 MED ORDER — PROMETHAZINE-CODEINE 6.25-10 MG/5ML PO SYRP: 5.0000 mL | ORAL_SOLUTION | Freq: Four times a day (QID) | ORAL | Status: DC | PRN

## 2015-05-04 MED FILL — PROMETHAZINE-CODEINE SYRUP: 6.25-10 | 5 days supply | Qty: 118 | Fill #0

## 2015-05-04 MED FILL — levoFLOXacin 500 MG TABS: 500 | 7 days supply | Qty: 7 | Fill #0

## 2015-05-04 NOTE — Progress Notes (Signed)
Pre visit review using our clinic review tool, if applicable. No additional management support is needed unless otherwise documented below in the visit note. 

## 2015-05-04 NOTE — Progress Notes (Signed)
Subjective:    Patient ID: Douglas Richards, male    DOB: 07/14/1945, 70 y.o.   MRN: [id number]  Chief Complaint  Patient presents with  . Cough    x4 days cough and has extreme pain in chest with the cough, sinus congestion, body aches, has not slept much in several days    HPI:  Douglas Richards is a 70 y.o. male who  has a past medical history of Herpes zoster without mention of complication; Anal fissure; Diverticulosis of colon (without mention of hemorrhage); Osteoarthrosis, unspecified whether generalized or localized, unspecified site; Other abnormal glucose; Allergic rhinitis; Hypertension; Hyperlipidemia; Gout, unspecified; Personal history of colonic polyps; Diabetes mellitus (2011); Allergy; Cataract; and GERD (gastroesophageal reflux disease). and presents today for an acute office visit.   This is a new problem. Associated symptom of a cough, sinus congestion, body aches, and decreased sleep has been going on for about 4 days. Modifying factors include Delsym and Mucinex which did not seem to help very much. No fevers. Describes pain located in his chest and feelings like razor blades when he coughs. Severity of his symptoms effects his sleep. Denies recent antibiotic use. Over the course of the 4 days the symptoms have stayed about the same.   Allergies  Allergen Reactions  . Atorvastatin     REACTION: aches  . Metformin     REACTION: GI upset  . Prednisolone Swelling    Red swollen face He had cortisone knee shot and was ok  . Sulfonamide Derivatives     REACTION: nausea  . Trulicity [Dulaglutide]     ?pancreatitis     Current Outpatient Prescriptions on File Prior to Visit  Medication Sig Dispense Refill  . allopurinol (ZYLOPRIM) 300 MG tablet Take 1 tablet (300 mg total) by mouth daily. 90 tablet 3  . amLODipine (NORVASC) 5 MG tablet TAKE 1 TABLET BY MOUTH ONCE DAILY 90 tablet 3  . aspirin 325 MG tablet Take 325 mg by mouth daily.      . Cholecalciferol (VITAMIN  D3) 1000 UNITS tablet Take 1,000 Units by mouth daily.      . CRESTOR 10 MG tablet TAKE 1 TABLET BY MOUTH ONCE DAILY 90 tablet 3  . diazepam (VALIUM) 5 MG tablet Take 0.5-1 tablets (2.5-5 mg total) by mouth every 12 (twelve) hours as needed for muscle spasms. 60 tablet 1  . Diclofenac Sodium 1.5 % SOLN Place onto the skin 3 (three) times daily.      . fluticasone (FLONASE) 50 MCG/ACT nasal spray Place 2 sprays into the nose daily. 48 g g  . Glucosamine-Chondroitin ([facility] GLUCOSAMINE-CHONDROITIN) 750-600 MG TABS Take by mouth 2 (two) times daily. Reported on 04/28/2015    . glucose blood test strip Use as instructed 100 each 3  . HYDROCORTISONE ACE, RECTAL, (HEMRIL-30) 30 MG SUPP Place 1 suppository (30 mg total) rectally 2 (two) times daily. 60 each 1  . hydrOXYzine (ATARAX/VISTARIL) 25 MG tablet Take 1-2 tablets (25-50 mg total) by mouth every 8 (eight) hours as needed for itching. 60 tablet 1  . loratadine (CLARITIN) 10 MG tablet Take 10 mg by mouth daily.    . metFORMIN (GLUCOPHAGE) 500 MG tablet Take 1 tablet (500 mg total) by mouth 2 (two) times daily with a meal. 60 tablet 11  . omeprazole (PRILOSEC) 20 MG capsule Take 1 capsule (20 mg total) by mouth daily. (Patient not taking: Reported on 04/28/2015) 90 capsule 3  . trandolapril (MAVIK) 4 MG tablet TAKE  1 TABLET BY MOUTH ONCE DAILY 90 tablet 3  . triamcinolone cream (KENALOG) 0.1 % Apply 1 application topically 2 (two) times daily. 90 g 3   No current facility-administered medications on file prior to visit.    Past Medical History  Diagnosis Date  . Herpes zoster without mention of complication   . Anal fissure   . Diverticulosis of colon (without mention of hemorrhage)   . Osteoarthrosis, unspecified whether generalized or localized, unspecified site   . Other abnormal glucose   . Allergic rhinitis   . Hypertension   . Hyperlipidemia   . Gout, unspecified   . Personal history of colonic polyps   . 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 Allergy   . Cataract     right eye   . GERD (gastroesophageal reflux disease)      Review of Systems  Constitutional: Negative for fever and chills.  HENT: Positive for congestion, sinus pressure and sore throat.   Respiratory: Positive for cough, shortness of breath and wheezing. Negative for chest tightness.   Neurological: Positive for headaches.      Objective:    BP 118/78 mmHg  Pulse 99  Temp(Src) 98.9 F (37.2 C) (Oral)  Resp 16  Ht 5\' 8"  (1.727 m)  Wt 180 lb (81.647 kg)  BMI 27.38 kg/m2  SpO2 95% Nursing note and vital signs reviewed.  Physical Exam  Constitutional: He is oriented to person, place, and time. He appears well-developed and well-nourished. No distress.  HENT:  Right Ear: Hearing, tympanic membrane, external ear and ear canal normal.  Left Ear: Hearing, tympanic membrane, external ear and ear canal normal.  Nose: Right sinus exhibits maxillary sinus tenderness. Left sinus exhibits maxillary sinus tenderness.  Mouth/Throat: Uvula is midline, oropharynx is clear and moist and mucous membranes are normal.  Cardiovascular: Normal rate, regular rhythm, normal heart sounds and intact distal pulses.   Pulmonary/Chest: Effort normal and breath sounds normal.  Neurological: He is alert and oriented to person, place, and time.  Skin: Skin is warm and dry.  Psychiatric: He has a normal mood and affect. His behavior is normal. Judgment and thought content normal.       Assessment & Plan:   Problem List Items Addressed This Visit      Other   Cough - Primary    Symptoms and exam consistent with bronchitis and possible underlying sinus infection. Start levofloxacin. Start promethazine-codeine as needed for cough and sleep. Continue over-the-counter medications as needed for symptom relief and supportive care. Follow-up if symptoms worsen or fail to improve.      Relevant Medications   promethazine-codeine (PHENERGAN WITH CODEINE) 6.25-10 MG/5ML syrup     levofloxacin (LEVAQUIN) 500 MG tablet

## 2015-05-04 NOTE — Assessment & Plan Note (Signed)
Symptoms and exam consistent with bronchitis and possible underlying sinus infection. Start levofloxacin. Start promethazine-codeine as needed for cough and sleep. Continue over-the-counter medications as needed for symptom relief and supportive care. Follow-up if symptoms worsen or fail to improve.

## 2015-05-04 NOTE — Patient Instructions (Signed)
Thank you for choosing [facility].  Summary/Instructions:  Please start the Levofloxacin daily until completed.  Please start the promethazine-codeine as needed for cough and sleep.  Your prescription(s) have been submitted to your pharmacy or been printed and provided for you. Please take as directed and contact our office if you believe you are having problem(s) with the medication(s) or have any questions.  If your symptoms worsen or fail to improve, please contact our office for further instruction, or in case of emergency go directly to the emergency room at the closest medical facility.   General Recommendations:    Please drink plenty of fluids.  Get plenty of rest   Sleep in humidified air  Use saline nasal sprays  Netti pot   OTC Medications:  Decongestants - helps relieve congestion   Flonase (generic fluticasone) or Nasacort (generic triamcinolone) - please make sure to use the "cross-over" technique at a 45 degree angle towards the opposite eye as opposed to straight up the nasal passageway.   Sudafed (generic pseudoephedrine - Note this is the one that is available behind the pharmacy counter); Products with phenylephrine (-PE) may also be used but is often not as effective as pseudoephedrine.   If you have HIGH BLOOD PRESSURE - Coricidin HBP; AVOID any product that is -D as this contains pseudoephedrine which may increase your blood pressure.  Afrin (oxymetazoline) every 6-8 hours for up to 3 days.   Allergies - helps relieve runny nose, itchy eyes and sneezing   Claritin (generic loratidine), Allegra (fexofenidine), or Zyrtec (generic cyrterizine) for runny nose. These medications should not cause drowsiness.  Note - Benadryl (generic diphenhydramine) may be used however may cause drowsiness  Cough -   Delsym or Robitussin (generic dextromethorphan)  Expectorants - helps loosen mucus to ease removal   Mucinex (generic guaifenesin) as  directed on the package.  Headaches / General Aches   Tylenol (generic acetaminophen) - DO NOT EXCEED 3 grams (3,000 mg) in a 24 hour time period  Advil/Motrin (generic ibuprofen)   Sore Throat -   Salt water gargle   Chloraseptic (generic benzocaine) spray or lozenges / Sucrets (generic dyclonine)

## 2015-05-07 ENCOUNTER — Telehealth: Payer: Self-pay | Admitting: Internal Medicine

## 2015-05-07 MED ORDER — AZITHROMYCIN 250 MG PO TABS: ORAL_TABLET | ORAL | Status: DC

## 2015-05-07 MED FILL — AZITHROMYCIN 250 MG TABLET: 250 | 5 days supply | Qty: 6 | Fill #0

## 2015-05-07 NOTE — Telephone Encounter (Signed)
Azithromycin sent to pharmacy

## 2015-05-07 NOTE — Telephone Encounter (Signed)
Please advise 

## 2015-05-07 NOTE — Telephone Encounter (Signed)
Douglas Richards prescribed an antibiotic the other day and he is having really bad nausea and thinks it may be because of this.  He is wondering if there could be a different one called in for him.  Pharmacy is [facility]

## 2015-05-10 NOTE — Telephone Encounter (Signed)
Pt aware.

## 2015-05-11 MED FILL — PROMETHAZINE-CODEINE SYRUP: 6.25-10 | 2 days supply | Qty: 62 | Fill #1

## 2015-05-20 ENCOUNTER — Other Ambulatory Visit (INDEPENDENT_AMBULATORY_CARE_PROVIDER_SITE_OTHER): Payer: PPO

## 2015-05-20 DIAGNOSIS — E118 Type 2 diabetes mellitus with unspecified complications: Secondary | ICD-10-CM

## 2015-05-20 LAB — BASIC METABOLIC PANEL
BUN: 27 mg/dL — ABNORMAL HIGH (ref 6–23)
CHLORIDE: 101 meq/L (ref 96–112)
CO2: 26 mEq/L (ref 19–32)
Calcium: 9.1 mg/dL (ref 8.4–10.5)
Creatinine, Ser: 1.49 mg/dL (ref 0.40–1.50)
GFR: 49.61 mL/min — AB (ref >60.00)
Glucose, Bld: 117 mg/dL — ABNORMAL HIGH (ref 70–99)
POTASSIUM: 4.1 meq/L (ref 3.5–5.1)
SODIUM: 135 meq/L (ref 135–145)

## 2015-05-20 LAB — HEMOGLOBIN A1C: Hgb A1c MFr Bld: 7.3 % — ABNORMAL HIGH (ref 4.6–6.5)

## 2015-05-23 MED FILL — OMEPRAZOLE DR 20 MG CAPSULE: 20 | 90 days supply | Qty: 90 | Fill #1

## 2015-05-25 ENCOUNTER — Encounter: Payer: Self-pay | Admitting: Internal Medicine

## 2015-05-25 ENCOUNTER — Ambulatory Visit (INDEPENDENT_AMBULATORY_CARE_PROVIDER_SITE_OTHER): Payer: PPO | Admitting: Internal Medicine

## 2015-05-25 VITALS — BP 130/86 | HR 87 | Wt 174.0 lb

## 2015-05-25 DIAGNOSIS — E785 Hyperlipidemia, unspecified: Secondary | ICD-10-CM

## 2015-05-25 DIAGNOSIS — N182 Chronic kidney disease, stage 2 (mild): Secondary | ICD-10-CM | POA: Diagnosis not present

## 2015-05-25 DIAGNOSIS — E1122 Type 2 diabetes mellitus with diabetic chronic kidney disease: Secondary | ICD-10-CM

## 2015-05-25 DIAGNOSIS — I10 Essential (primary) hypertension: Secondary | ICD-10-CM | POA: Diagnosis not present

## 2015-05-25 MED ORDER — METFORMIN HCL 500 MG PO TABS: 500.0000 mg | ORAL_TABLET | Freq: Two times a day (BID) | ORAL | Status: DC

## 2015-05-25 MED FILL — metFORMIN HCL 500 MG TABS: 500 | 90 days supply | Qty: 180 | Fill #0

## 2015-05-25 NOTE — Assessment & Plan Note (Addendum)
Worse Labs in 4 mo On Metformin 500 mg bid

## 2015-05-25 NOTE — Progress Notes (Signed)
Subjective:  Patient ID: Douglas Richards, male    DOB: 07/26/1945  Age: 70 y.o. MRN: [id number]  CC: No chief complaint on file.   HPI LAKEITH SHAMBAUGH presents for DM, dyslipidemia, HTN f/u. Pt was sick w/URI and nausea - lost 8 lbs. He was nauseated x 1-2 wks - resolved   Outpatient Prescriptions Prior to Visit  Medication Sig Dispense Refill  . allopurinol (ZYLOPRIM) 300 MG tablet Take 1 tablet (300 mg total) by mouth daily. 90 tablet 3  . amLODipine (NORVASC) 5 MG tablet TAKE 1 TABLET BY MOUTH ONCE DAILY 90 tablet 3  . aspirin 325 MG tablet Take 325 mg by mouth daily.      . Cholecalciferol (VITAMIN D3) 1000 UNITS tablet Take 1,000 Units by mouth daily.      . CRESTOR 10 MG tablet TAKE 1 TABLET BY MOUTH ONCE DAILY 90 tablet 3  . diazepam (VALIUM) 5 MG tablet Take 0.5-1 tablets (2.5-5 mg total) by mouth every 12 (twelve) hours as needed for muscle spasms. 60 tablet 1  . Diclofenac Sodium 1.5 % SOLN Place onto the skin 3 (three) times daily.      . fluticasone (FLONASE) 50 MCG/ACT nasal spray Place 2 sprays into the nose daily. 48 g g  . Glucosamine-Chondroitin ([facility]S GLUCOSAMINE-CHONDROITIN) 750-600 MG TABS Take by mouth 2 (two) times daily. Reported on 04/28/2015    . glucose blood test strip Use as instructed 100 each 3  . HYDROCORTISONE ACE, RECTAL, (HEMRIL-30) 30 MG SUPP Place 1 suppository (30 mg total) rectally 2 (two) times daily. 60 each 1  . hydrOXYzine (ATARAX/VISTARIL) 25 MG tablet Take 1-2 tablets (25-50 mg total) by mouth every 8 (eight) hours as needed for itching. 60 tablet 1  . loratadine (CLARITIN) 10 MG tablet Take 10 mg by mouth daily.    . metFORMIN (GLUCOPHAGE) 500 MG tablet Take 1 tablet (500 mg total) by mouth 2 (two) times daily with a meal. 60 tablet 11  . omeprazole (PRILOSEC) 20 MG capsule Take 1 capsule (20 mg total) by mouth daily. 90 capsule 3  . promethazine-codeine (PHENERGAN WITH CODEINE) 6.25-10 MG/5ML syrup Take 5 mLs by mouth every 6 (six) hours as needed  for cough. 180 mL 0  . trandolapril (MAVIK) 4 MG tablet TAKE 1 TABLET BY MOUTH ONCE DAILY 90 tablet 3  . triamcinolone cream (KENALOG) 0.1 % Apply 1 application topically 2 (two) times daily. 90 g 3  . azithromycin (ZITHROMAX) 250 MG tablet Take 2 tablets by mouth for 1 day and then 1 tablet by mouth for 4 days. (Patient not taking: Reported on 05/25/2015) 6 tablet 0   No facility-administered medications prior to visit.    ROS Review of Systems  Constitutional: Positive for unexpected weight change. Negative for appetite change and fatigue.  HENT: Negative for congestion, nosebleeds, sneezing, sore throat and trouble swallowing.   Eyes: Negative for itching and visual disturbance.  Respiratory: Negative for cough.   Cardiovascular: Negative for chest pain, palpitations and leg swelling.  Gastrointestinal: Negative for nausea, diarrhea, blood in stool and abdominal distention.  Genitourinary: Negative for frequency and hematuria.  Musculoskeletal: Negative for back pain, joint swelling, gait problem and neck pain.  Skin: Negative for rash.  Neurological: Negative for dizziness, tremors, speech difficulty and weakness.  Psychiatric/Behavioral: Negative for suicidal ideas, sleep disturbance, dysphoric mood and agitation. The patient is not nervous/anxious.     Objective:  BP 130/86 mmHg  Pulse 87  Wt 174 lb (78.926 kg)  SpO2 97%  BP Readings from Last 3 Encounters:  05/25/15 130/86  05/04/15 118/78  01/26/15 138/84    Wt Readings from Last 3 Encounters:  05/25/15 174 lb (78.926 kg)  05/04/15 180 lb (81.647 kg)  04/28/15 177 lb 8 oz (80.513 kg)    Physical Exam  Constitutional: He is oriented to person, place, and time. He appears well-developed. No distress.  NAD  HENT:  Mouth/Throat: Oropharynx is clear and moist.  Eyes: Conjunctivae are normal. Pupils are equal, round, and reactive to light.  Neck: Normal range of motion. No JVD present. No thyromegaly present.    Cardiovascular: Normal rate, regular rhythm, normal heart sounds and intact distal pulses.  Exam reveals no gallop and no friction rub.   No murmur heard. Pulmonary/Chest: Effort normal and breath sounds normal. No respiratory distress. He has no wheezes. He has no rales. He exhibits no tenderness.  Abdominal: Soft. Bowel sounds are normal. He exhibits no distension and no mass. There is no tenderness. There is no rebound and no guarding.  Musculoskeletal: Normal range of motion. He exhibits no edema or tenderness.  Lymphadenopathy:    He has no cervical adenopathy.  Neurological: He is alert and oriented to person, place, and time. He has normal reflexes. No cranial nerve deficit. He exhibits normal muscle tone. He displays a negative Romberg sign. Coordination and gait normal.  Skin: Skin is warm and dry. No rash noted.  Psychiatric: He has a normal mood and affect. His behavior is normal. Judgment and thought content normal.    Lab Results  Component Value Date   WBC 12.9* 11/04/2014   HGB 14.3 11/04/2014   HCT 42.8 11/04/2014   PLT 210.0 11/04/2014   GLUCOSE 117* 05/20/2015   CHOL 184 09/10/2013   TRIG 321.0* 09/10/2013   HDL 36.70* 09/10/2013   LDLDIRECT 105.5 09/10/2013   LDLCALC 66 08/30/2011   ALT 16 11/04/2014   AST 17 11/04/2014   NA 135 05/20/2015   K 4.1 05/20/2015   CL 101 05/20/2015   CREATININE 1.49 05/20/2015   BUN 27* 05/20/2015   CO2 26 05/20/2015   TSH 2.48 05/17/2012   PSA 0.91 05/17/2012   HGBA1C 7.3* 05/20/2015   MICROALBUR 0.4 03/31/2010    Ct Abdomen Pelvis Wo Contrast  11/04/2014  CLINICAL DATA:  Pain and bloating mid abdomen. Symptoms for several days, with low-grade fever. EXAM: CT ABDOMEN AND PELVIS WITHOUT CONTRAST TECHNIQUE: Multidetector CT imaging of the abdomen and pelvis was performed following the standard protocol without IV contrast. COMPARISON:  None. FINDINGS: No intrarenal or proximal ureteral calculi on either side. No evidence of  hydronephrosis or other secondary signs of upper urinary tract obstruction. Within limits of unenhanced technique, no renal masses. Kidneys appear small with irregular outline suggesting chronic scarring. Atrophic pancreas. Mildly distended gallbladder, possible sludge. No definite stones. Again, within limits of unenhanced technique, remaining visualized upper abdomen unremarkable. Visualized extreme lung bases clear. No distal ureteral calculi on either side. Appendix identified and normal. Mild prostatic enlargement without bladder obstruction. Moderately distended colon which is filled with fecal material. There is no visible fecal impaction in the rectum. Unremarkable appearing diverticulosis without signs wall thickening or diverticulitis. IMPRESSION: No evidence for bowel obstruction or colitis. Suspected constipation. No definite fecal impaction. No evidence for diverticulitis. Atrophic pancreas. Mildly distended gallbladder with possible sludge. Electronically Signed   By: Staci Righter M.D.   On: 11/04/2014 16:41    Assessment & Plan:   There are no diagnoses  linked to this encounter. I am having Mr. Geis maintain his aspirin, Glucosamine-Chondroitin, Diclofenac Sodium, cholecalciferol, fluticasone, HYDROCORTISONE ACE (RECTAL), hydrOXYzine, diazepam, triamcinolone cream, loratadine, omeprazole, glucose blood, metFORMIN, amLODipine, trandolapril, allopurinol, CRESTOR, promethazine-codeine, and azithromycin.  No orders of the defined types were placed in this encounter.     Follow-up: No Follow-up on file.  [name], MD

## 2015-05-25 NOTE — Progress Notes (Signed)
Pre visit review using our clinic review tool, if applicable. No additional management support is needed unless otherwise documented below in the visit note. 

## 2015-05-25 NOTE — Assessment & Plan Note (Signed)
Amlodipine.

## 2015-05-25 NOTE — Assessment & Plan Note (Signed)
-   Crestor 

## 2015-06-11 MED FILL — TRANDOLAPRIL 4 MG TABLET: 4 | 90 days supply | Qty: 90 | Fill #1

## 2015-06-27 MED FILL — ALLOPURINOL 300 MG TABLET: 300 | 90 days supply | Qty: 90 | Fill #1

## 2015-07-12 MED FILL — ROSUVASTATIN CALCIUM 10 MG: 10 | 90 days supply | Qty: 90 | Fill #1

## 2015-08-01 MED FILL — AMLODIPINE BESYLATE 5 MG TA: 5 | 90 days supply | Qty: 90 | Fill #2

## 2015-08-15 MED FILL — OMEPRAZOLE DR 20 MG CAPSULE: 20 | 90 days supply | Qty: 90 | Fill #2

## 2015-08-16 MED FILL — FREESTYLE LITE TEST STRIP: 50 days supply | Qty: 100 | Fill #3

## 2015-08-16 MED FILL — metFORMIN HCL 500 MG TABS: 500 | 90 days supply | Qty: 180 | Fill #1

## 2015-09-05 MED FILL — TRANDOLAPRIL 4 MG TABLET: 4 | 90 days supply | Qty: 90 | Fill #2

## 2015-09-17 ENCOUNTER — Other Ambulatory Visit (INDEPENDENT_AMBULATORY_CARE_PROVIDER_SITE_OTHER): Payer: PPO

## 2015-09-17 DIAGNOSIS — E1122 Type 2 diabetes mellitus with diabetic chronic kidney disease: Secondary | ICD-10-CM | POA: Diagnosis not present

## 2015-09-17 DIAGNOSIS — E785 Hyperlipidemia, unspecified: Secondary | ICD-10-CM

## 2015-09-17 DIAGNOSIS — I10 Essential (primary) hypertension: Secondary | ICD-10-CM | POA: Diagnosis not present

## 2015-09-17 DIAGNOSIS — N182 Chronic kidney disease, stage 2 (mild): Secondary | ICD-10-CM | POA: Diagnosis not present

## 2015-09-17 LAB — LIPID PANEL
CHOLESTEROL: 122 mg/dL (ref 0–200)
HDL: 40.3 mg/dL (ref >39.00)
LDL CALC: 51 mg/dL (ref 0–99)
NonHDL: 81.49
TRIGLYCERIDES: 150 mg/dL — AB (ref 0.0–149.0)
Total CHOL/HDL Ratio: 3
VLDL: 30 mg/dL (ref 0.0–40.0)

## 2015-09-17 LAB — URINALYSIS
Bilirubin Urine: NEGATIVE
Hgb urine dipstick: NEGATIVE
Ketones, ur: NEGATIVE
Leukocytes, UA: NEGATIVE
NITRITE: NEGATIVE
PH: 5.5 (ref 5.0–8.0)
TOTAL PROTEIN, URINE-UPE24: NEGATIVE
Urine Glucose: NEGATIVE
Urobilinogen, UA: 0.2 (ref 0.0–1.0)

## 2015-09-17 LAB — CBC WITH DIFFERENTIAL/PLATELET
BASOS PCT: 0.7 % (ref 0.0–3.0)
Basophils Absolute: 0 10*3/uL (ref 0.0–0.1)
EOS ABS: 0.1 10*3/uL (ref 0.0–0.7)
Eosinophils Relative: 2.2 % (ref 0.0–5.0)
HCT: 40.5 % (ref 39.0–52.0)
HEMOGLOBIN: 13.5 g/dL (ref 13.0–17.0)
LYMPHS ABS: 2.2 10*3/uL (ref 0.7–4.0)
Lymphocytes Relative: 36.2 % (ref 12.0–46.0)
MCHC: 33.4 g/dL (ref 30.0–36.0)
MCV: 92.7 fl (ref 78.0–100.0)
MONO ABS: 0.6 10*3/uL (ref 0.1–1.0)
Monocytes Relative: 10.2 % (ref 3.0–12.0)
NEUTROS PCT: 50.7 % (ref 43.0–77.0)
Neutro Abs: 3 10*3/uL (ref 1.4–7.7)
PLATELETS: 151 10*3/uL (ref 150.0–400.0)
RBC: 4.37 Mil/uL (ref 4.22–5.81)
RDW: 14.3 % (ref 11.5–15.5)
WBC: 6 10*3/uL (ref 4.0–10.5)

## 2015-09-17 LAB — BASIC METABOLIC PANEL
BUN: 27 mg/dL — AB (ref 6–23)
CHLORIDE: 100 meq/L (ref 96–112)
CO2: 25 mEq/L (ref 19–32)
CREATININE: 1.64 mg/dL — AB (ref 0.40–1.50)
Calcium: 9.6 mg/dL (ref 8.4–10.5)
GFR: 44.37 mL/min — ABNORMAL LOW (ref >60.00)
Glucose, Bld: 104 mg/dL — ABNORMAL HIGH (ref 70–99)
Potassium: 4.6 mEq/L (ref 3.5–5.1)
Sodium: 134 mEq/L — ABNORMAL LOW (ref 135–145)

## 2015-09-17 LAB — HEPATIC FUNCTION PANEL
ALT: 13 U/L (ref 0–53)
AST: 15 U/L (ref 0–37)
Albumin: 4.4 g/dL (ref 3.5–5.2)
Alkaline Phosphatase: 47 U/L (ref 39–117)
BILIRUBIN TOTAL: 0.5 mg/dL (ref 0.2–1.2)
Bilirubin, Direct: 0.1 mg/dL (ref 0.0–0.3)
Total Protein: 6.8 g/dL (ref 6.0–8.3)

## 2015-09-17 LAB — HEMOGLOBIN A1C: HEMOGLOBIN A1C: 6.7 % — AB (ref 4.6–6.5)

## 2015-09-17 LAB — PSA: PSA: 0.81 ng/mL (ref 0.10–4.00)

## 2015-09-17 LAB — TSH: TSH: 2.65 u[IU]/mL (ref 0.35–4.50)

## 2015-09-18 LAB — HEPATITIS C ANTIBODY: HCV Ab: NEGATIVE

## 2015-09-20 MED FILL — ALLOPURINOL 300 MG TABLET: 300 | 90 days supply | Qty: 90 | Fill #2

## 2015-09-24 ENCOUNTER — Encounter: Payer: Self-pay | Admitting: Internal Medicine

## 2015-09-24 ENCOUNTER — Ambulatory Visit (INDEPENDENT_AMBULATORY_CARE_PROVIDER_SITE_OTHER): Payer: PPO | Admitting: Internal Medicine

## 2015-09-24 DIAGNOSIS — Z0000 Encounter for general adult medical examination without abnormal findings: Secondary | ICD-10-CM

## 2015-09-24 DIAGNOSIS — N182 Chronic kidney disease, stage 2 (mild): Secondary | ICD-10-CM | POA: Diagnosis not present

## 2015-09-24 DIAGNOSIS — E1122 Type 2 diabetes mellitus with diabetic chronic kidney disease: Secondary | ICD-10-CM | POA: Diagnosis not present

## 2015-09-24 DIAGNOSIS — I10 Essential (primary) hypertension: Secondary | ICD-10-CM

## 2015-09-24 MED ORDER — FREESTYLE LANCETS MISC: 3 refills | Status: DC

## 2015-09-24 MED ORDER — FREESTYLE LANCETS MISC: 12 refills | Status: DC

## 2015-09-24 NOTE — Assessment & Plan Note (Signed)
  On Metformin 500 mg bid labs

## 2015-09-24 NOTE — Assessment & Plan Note (Signed)

## 2015-09-24 NOTE — Addendum Note (Signed)
Addended by: Aviva Signs M on: 09/24/2015 11:00 AM   Modules accepted: Orders

## 2015-09-24 NOTE — Patient Instructions (Signed)

## 2015-09-24 NOTE — Progress Notes (Signed)
Subjective:  Patient ID: Douglas Richards, male    DOB: 20-Sep-1945  Age: 70 y.o. MRN: [id number]  CC: No chief complaint on file.   HPI ZYKING QUIGGINS presents for DM, HTN, CRI f/u. C/o allergies Well exam  Outpatient Medications Prior to Visit  Medication Sig Dispense Refill  . allopurinol (ZYLOPRIM) 300 MG tablet Take 1 tablet (300 mg total) by mouth daily. 90 tablet 3  . amLODipine (NORVASC) 5 MG tablet TAKE 1 TABLET BY MOUTH ONCE DAILY 90 tablet 3  . aspirin 325 MG tablet Take 325 mg by mouth daily.      . Cholecalciferol (VITAMIN D3) 1000 UNITS tablet Take 1,000 Units by mouth daily.      . CRESTOR 10 MG tablet TAKE 1 TABLET BY MOUTH ONCE DAILY 90 tablet 3  . diazepam (VALIUM) 5 MG tablet Take 0.5-1 tablets (2.5-5 mg total) by mouth every 12 (twelve) hours as needed for muscle spasms. 60 tablet 1  . Diclofenac Sodium 1.5 % SOLN Place onto the skin 3 (three) times daily.      . fluticasone (FLONASE) 50 MCG/ACT nasal spray Place 2 sprays into the nose daily. 48 g g  . Glucosamine-Chondroitin ([employer] GLUCOSAMINE-CHONDROITIN) 750-600 MG TABS Take by mouth 2 (two) times daily. Reported on 04/28/2015    . glucose blood test strip Use as instructed 100 each 3  . HYDROCORTISONE ACE, RECTAL, (HEMRIL-30) 30 MG SUPP Place 1 suppository (30 mg total) rectally 2 (two) times daily. 60 each 1  . hydrOXYzine (ATARAX/VISTARIL) 25 MG tablet Take 1-2 tablets (25-50 mg total) by mouth every 8 (eight) hours as needed for itching. 60 tablet 1  . loratadine (CLARITIN) 10 MG tablet Take 10 mg by mouth daily.    . metFORMIN (GLUCOPHAGE) 500 MG tablet Take 1 tablet (500 mg total) by mouth 2 (two) times daily with a meal. 180 tablet 3  . omeprazole (PRILOSEC) 20 MG capsule Take 1 capsule (20 mg total) by mouth daily. 90 capsule 3  . trandolapril (MAVIK) 4 MG tablet TAKE 1 TABLET BY MOUTH ONCE DAILY 90 tablet 3  . triamcinolone cream (KENALOG) 0.1 % Apply 1 application topically 2 (two) times daily. 90 g 3   No  facility-administered medications prior to visit.     ROS Review of Systems  Constitutional: Positive for unexpected weight change. Negative for appetite change and fatigue.  HENT: Negative for congestion, nosebleeds, sneezing, sore throat and trouble swallowing.   Eyes: Negative for itching and visual disturbance.  Respiratory: Negative for cough.   Cardiovascular: Negative for chest pain, palpitations and leg swelling.  Gastrointestinal: Negative for abdominal distention, blood in stool, diarrhea and nausea.  Genitourinary: Negative for frequency and hematuria.  Musculoskeletal: Negative for back pain, gait problem, joint swelling and neck pain.  Skin: Negative for rash.  Neurological: Negative for dizziness, tremors, speech difficulty and weakness.  Psychiatric/Behavioral: Negative for agitation, dysphoric mood, sleep disturbance and suicidal ideas. The patient is not nervous/anxious.     Objective:  BP 124/80 (BP Location: Left Arm, Patient Position: Sitting, Cuff Size: Normal)   Pulse 72   Temp 98 F (36.7 C) (Oral)   Ht 5\' 8"  (1.727 m)   Wt 170 lb 8 oz (77.3 kg)   SpO2 97%   BMI 25.92 kg/m   BP Readings from Last 3 Encounters:  09/24/15 124/80  05/25/15 130/86  05/04/15 118/78    Wt Readings from Last 3 Encounters:  09/24/15 170 lb 8 oz (77.3 kg)  05/25/15 174 lb (78.9 kg)  05/04/15 180 lb (81.6 kg)    Physical Exam  Constitutional: He is oriented to person, place, and time. He appears well-developed. No distress.  NAD  HENT:  Mouth/Throat: Oropharynx is clear and moist.  Eyes: Conjunctivae are normal. Pupils are equal, round, and reactive to light.  Neck: Normal range of motion. No JVD present. No thyromegaly present.  Cardiovascular: Normal rate, regular rhythm, normal heart sounds and intact distal pulses.  Exam reveals no gallop and no friction rub.   No murmur heard. Pulmonary/Chest: Effort normal and breath sounds normal. No respiratory distress. He has  no wheezes. He has no rales. He exhibits no tenderness.  Abdominal: Soft. Bowel sounds are normal. He exhibits no distension and no mass. There is no tenderness. There is no rebound and no guarding.  Genitourinary: Rectal exam shows guaiac negative stool. No penile tenderness.  Musculoskeletal: Normal range of motion. He exhibits no edema or tenderness.  Lymphadenopathy:    He has no cervical adenopathy.  Neurological: He is alert and oriented to person, place, and time. He has normal reflexes. No cranial nerve deficit. He exhibits normal muscle tone. He displays a negative Romberg sign. Coordination and gait normal.  Skin: Skin is warm and dry. No rash noted.  Psychiatric: He has a normal mood and affect. His behavior is normal. Judgment and thought content normal.    Lab Results  Component Value Date   WBC 6.0 09/17/2015   HGB 13.5 09/17/2015   HCT 40.5 09/17/2015   PLT 151.0 09/17/2015   GLUCOSE 104 (H) 09/17/2015   CHOL 122 09/17/2015   TRIG 150.0 (H) 09/17/2015   HDL 40.30 09/17/2015   LDLDIRECT 105.5 09/10/2013   LDLCALC 51 09/17/2015   ALT 13 09/17/2015   AST 15 09/17/2015   NA 134 (L) 09/17/2015   K 4.6 09/17/2015   CL 100 09/17/2015   CREATININE 1.64 (H) 09/17/2015   BUN 27 (H) 09/17/2015   CO2 25 09/17/2015   TSH 2.65 09/17/2015   PSA 0.81 09/17/2015   HGBA1C 6.7 (H) 09/17/2015   MICROALBUR 0.4 03/31/2010    Ct Abdomen Pelvis Wo Contrast  Result Date: 11/04/2014 CLINICAL DATA:  Pain and bloating mid abdomen. Symptoms for several days, with low-grade fever. EXAM: CT ABDOMEN AND PELVIS WITHOUT CONTRAST TECHNIQUE: Multidetector CT imaging of the abdomen and pelvis was performed following the standard protocol without IV contrast. COMPARISON:  None. FINDINGS: No intrarenal or proximal ureteral calculi on either side. No evidence of hydronephrosis or other secondary signs of upper urinary tract obstruction. Within limits of unenhanced technique, no renal masses. Kidneys  appear small with irregular outline suggesting chronic scarring. Atrophic pancreas. Mildly distended gallbladder, possible sludge. No definite stones. Again, within limits of unenhanced technique, remaining visualized upper abdomen unremarkable. Visualized extreme lung bases clear. No distal ureteral calculi on either side. Appendix identified and normal. Mild prostatic enlargement without bladder obstruction. Moderately distended colon which is filled with fecal material. There is no visible fecal impaction in the rectum. Unremarkable appearing diverticulosis without signs wall thickening or diverticulitis. IMPRESSION: No evidence for bowel obstruction or colitis. Suspected constipation. No definite fecal impaction. No evidence for diverticulitis. Atrophic pancreas. Mildly distended gallbladder with possible sludge. Electronically Signed   By: Staci Righter M.D.   On: 11/04/2014 16:41    Assessment & Plan:   There are no diagnoses linked to this encounter. I am having Mr. Lunde maintain his aspirin, Glucosamine-Chondroitin, Diclofenac Sodium, cholecalciferol, fluticasone, HYDROCORTISONE ACE (  RECTAL), hydrOXYzine, diazepam, triamcinolone cream, loratadine, omeprazole, glucose blood, amLODipine, trandolapril, allopurinol, CRESTOR, and metFORMIN.  No orders of the defined types were placed in this encounter.    Follow-up: No Follow-up on file.  [name], MD

## 2015-09-24 NOTE — Addendum Note (Signed)
Addended by: Aviva Signs M on: 09/24/2015 11:01 AM   Modules accepted: Orders

## 2015-09-24 NOTE — Progress Notes (Signed)
Pre visit review using our clinic review tool, if applicable. No additional management support is needed unless otherwise documented below in the visit note. 

## 2015-09-24 NOTE — Assessment & Plan Note (Signed)
Amlodipine.

## 2015-10-08 MED FILL — ROSUVASTATIN CALCIUM 10 MG: 10 | 90 days supply | Qty: 90 | Fill #2

## 2015-10-25 MED FILL — AMLODIPINE BESYLATE 5 MG TA: 5 | 90 days supply | Qty: 90 | Fill #3

## 2015-11-09 ENCOUNTER — Ambulatory Visit (INDEPENDENT_AMBULATORY_CARE_PROVIDER_SITE_OTHER): Payer: PPO

## 2015-11-09 DIAGNOSIS — Z23 Encounter for immunization: Secondary | ICD-10-CM

## 2015-11-14 MED FILL — metFORMIN HCL 500 MG TABS: 500 | 90 days supply | Qty: 180 | Fill #2

## 2015-11-15 ENCOUNTER — Other Ambulatory Visit: Payer: Self-pay | Admitting: Internal Medicine

## 2015-11-15 MED FILL — OMEPRAZOLE 20 MG CAPSULE DR: 20 | 90 days supply | Qty: 90 | Fill #0

## 2015-12-07 IMAGING — CT CT ABD-PELV W/O CM
2 of 5 series · 16 of 46 positions shown, 18 images · IV contrast (Omnipaque 300)
Comparison: None.

CLINICAL DATA: Pain and bloating mid abdomen. Symptoms for several
days, with low-grade fever.

EXAM:
CT ABDOMEN AND PELVIS WITHOUT CONTRAST
TECHNIQUE: Multidetector CT imaging of the abdomen and pelvis was performed
following the standard protocol without IV contrast.

[Series 2: ap without · axial · non-contrast · 0.75mm/px · z∈[-476,-46]mm · 13 of 102 slices shown, 15 images]
[im 8/102  soft-tissue]
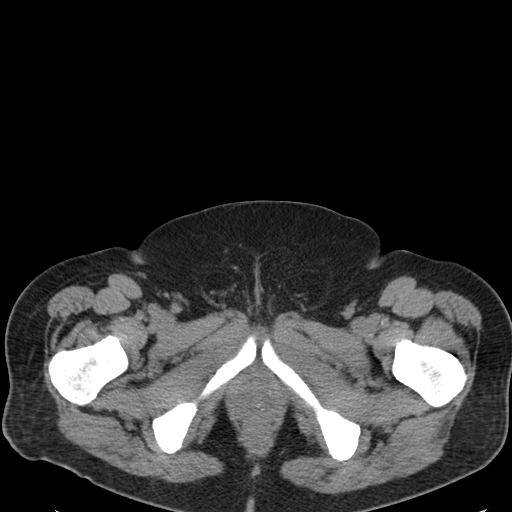
[im 8/102  bone]
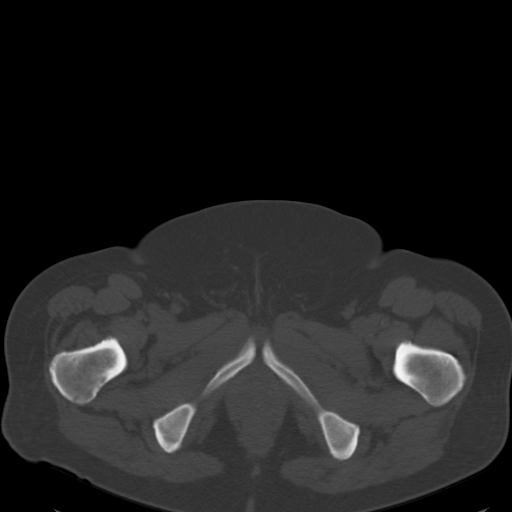
[im 15/102  soft-tissue]
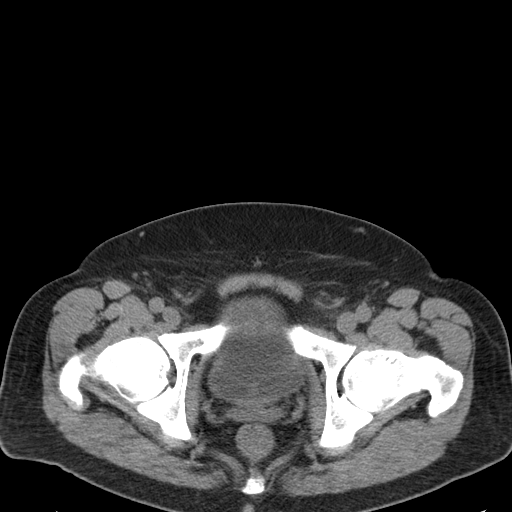
[im 22/102  soft-tissue]
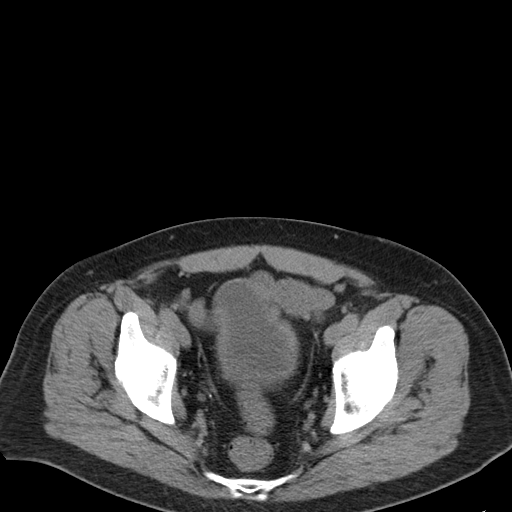
[im 29/102  soft-tissue]
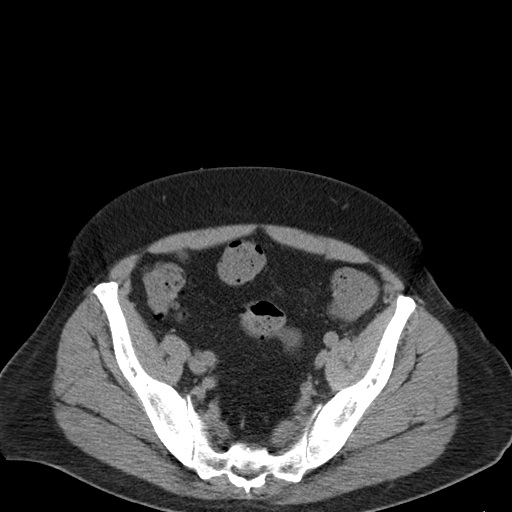
[im 37/102  soft-tissue]
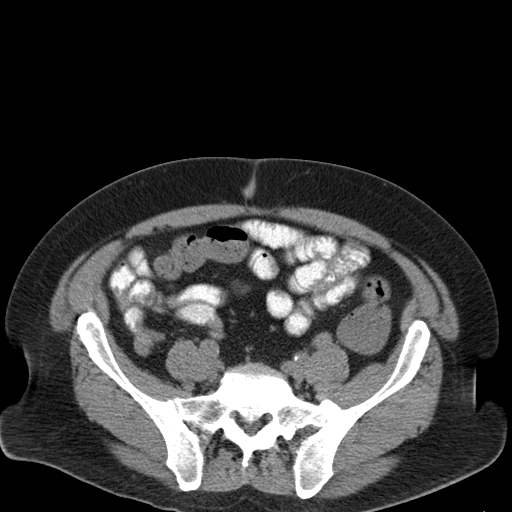
[im 44/102  soft-tissue]
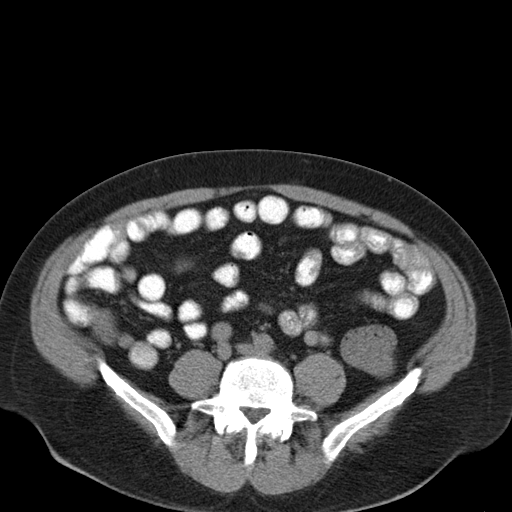
[im 51/102  soft-tissue]
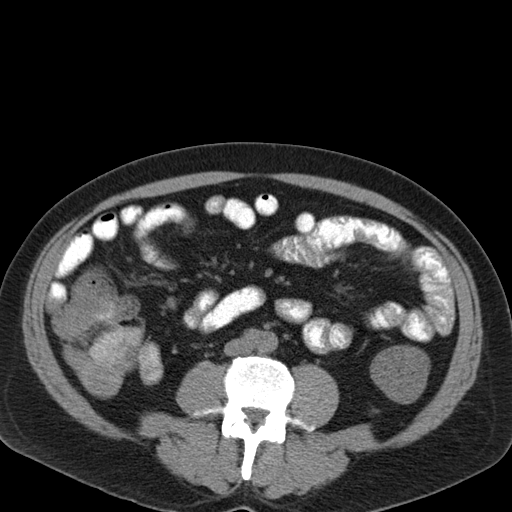
[im 58/102  soft-tissue]
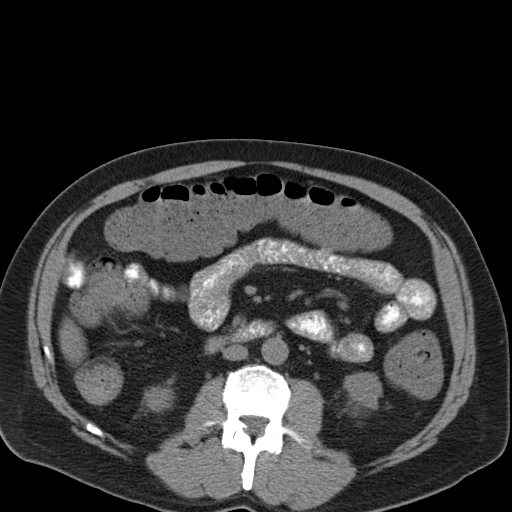
[im 65/102  soft-tissue]
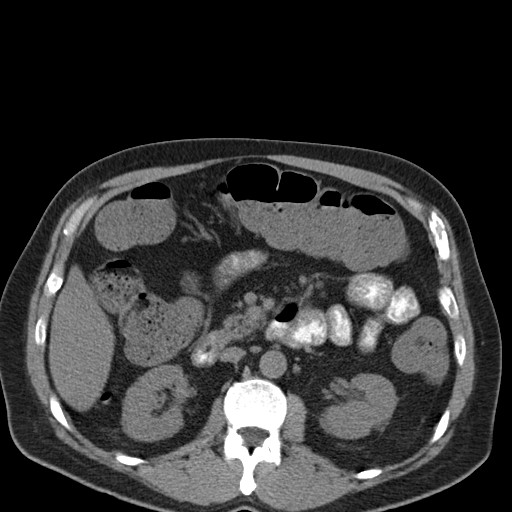
[im 65/102  bone]
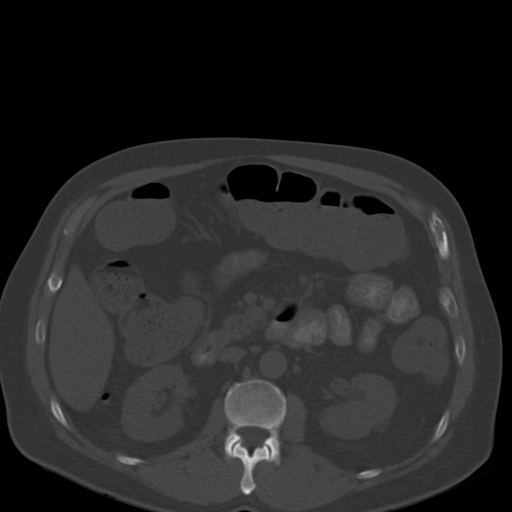
[im 73/102  soft-tissue]
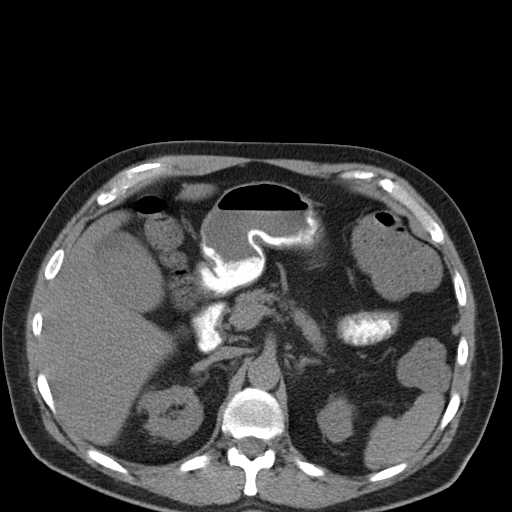
[im 80/102  soft-tissue]
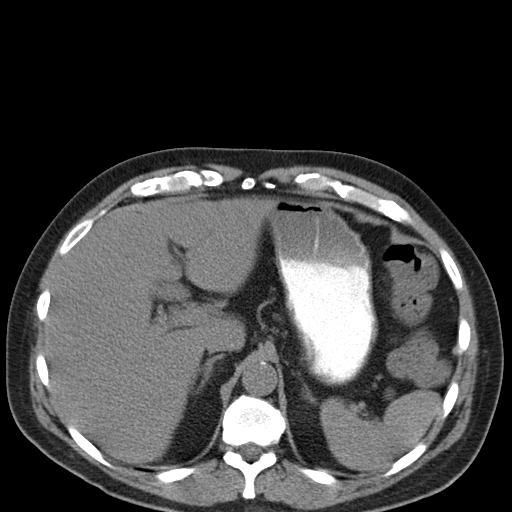
[im 87/102  soft-tissue]
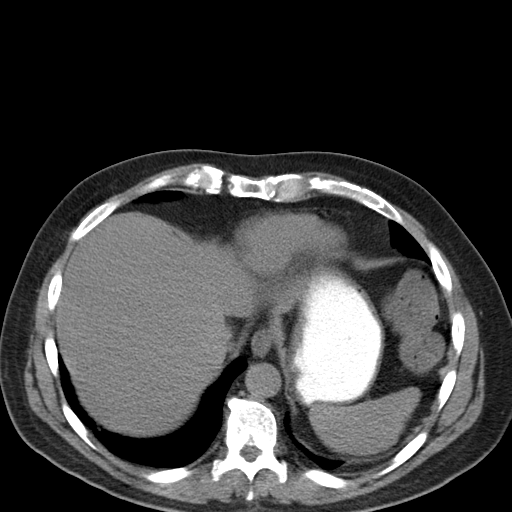
[im 94/102  soft-tissue]
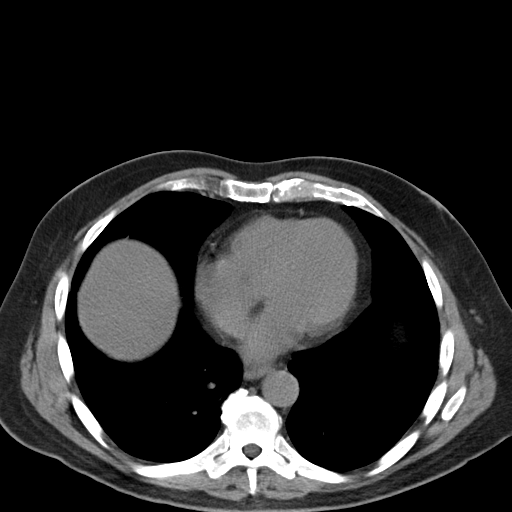

[Series 602: coronal · coronal · 1.03mm/px · 3 of 91 slices shown]
[im 31/91  soft-tissue]
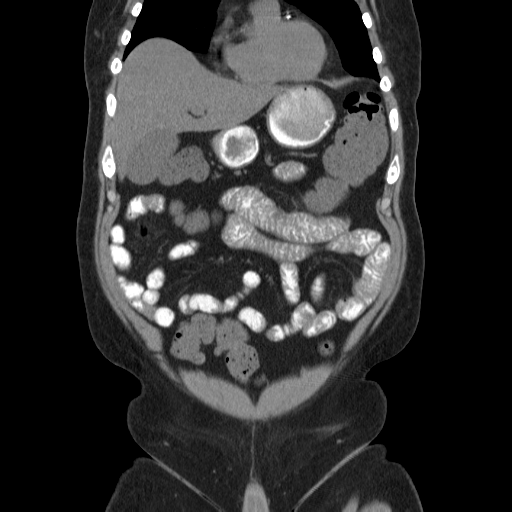
[im 41/91  soft-tissue]
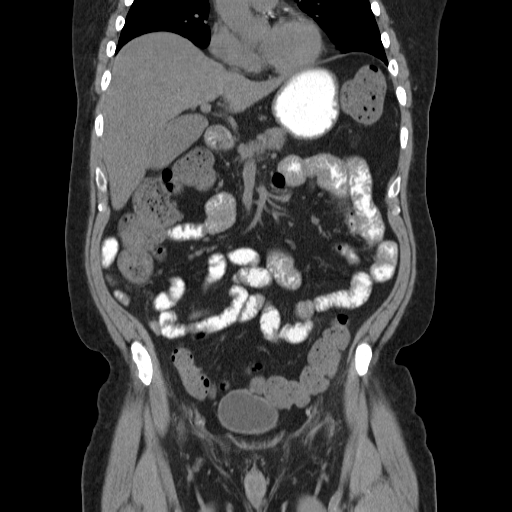
[im 51/91  soft-tissue]
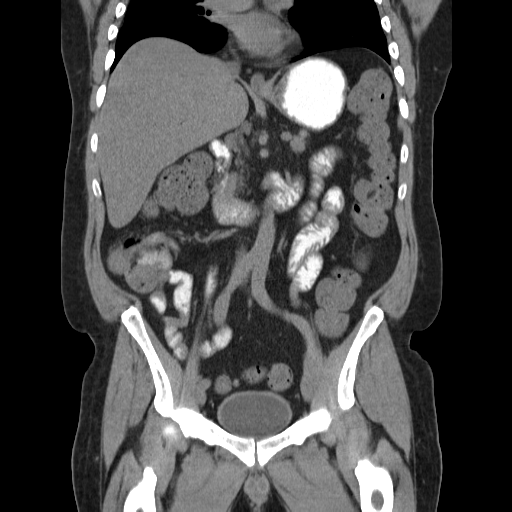

[16 of 46 positions shown; findings below may reference images not displayed]

FINDINGS: No intrarenal or proximal ureteral calculi on either side. No
evidence of hydronephrosis or other secondary signs of upper urinary
tract obstruction. Within limits of unenhanced technique, no renal
masses. Kidneys appear small with irregular outline suggesting
chronic scarring. Atrophic pancreas. Mildly distended gallbladder,
possible sludge. No definite stones.

Again, within limits of unenhanced technique, remaining visualized
upper abdomen unremarkable. Visualized extreme lung bases clear.

No distal ureteral calculi on either side. Appendix identified and
normal. Mild prostatic enlargement without bladder obstruction.

Moderately distended colon which is filled with fecal material.
There is no visible fecal impaction in the rectum. Unremarkable
appearing diverticulosis without signs wall thickening or
diverticulitis.
IMPRESSION: No evidence for bowel obstruction or colitis.

Suspected constipation. No definite fecal impaction. No evidence for
diverticulitis.

Atrophic pancreas. Mildly distended gallbladder with possible
sludge.

## 2015-12-07 MED FILL — TRANDOLAPRIL 4 MG TABLET: 4 | 90 days supply | Qty: 90 | Fill #3

## 2015-12-20 MED FILL — ALLOPURINOL 300 MG TABLET: 300 | 90 days supply | Qty: 90 | Fill #3

## 2016-01-03 MED FILL — ROSUVASTATIN CALCIUM 10 MG: 10 | 90 days supply | Qty: 90 | Fill #3

## 2016-01-17 ENCOUNTER — Other Ambulatory Visit: Payer: Self-pay | Admitting: Internal Medicine

## 2016-01-17 ENCOUNTER — Other Ambulatory Visit (INDEPENDENT_AMBULATORY_CARE_PROVIDER_SITE_OTHER): Payer: PPO

## 2016-01-17 DIAGNOSIS — E118 Type 2 diabetes mellitus with unspecified complications: Secondary | ICD-10-CM | POA: Diagnosis not present

## 2016-01-17 LAB — BASIC METABOLIC PANEL
BUN: 22 mg/dL (ref 6–23)
CALCIUM: 9.7 mg/dL (ref 8.4–10.5)
CO2: 26 mEq/L (ref 19–32)
Chloride: 100 mEq/L (ref 96–112)
Creatinine, Ser: 1.5 mg/dL (ref 0.40–1.50)
GFR: 49.13 mL/min — AB (ref >60.00)
GLUCOSE: 115 mg/dL — AB (ref 70–99)
POTASSIUM: 4.8 meq/L (ref 3.5–5.1)
SODIUM: 136 meq/L (ref 135–145)

## 2016-01-17 LAB — HEMOGLOBIN A1C: Hgb A1c MFr Bld: 6.9 % — ABNORMAL HIGH (ref 4.6–6.5)

## 2016-01-18 MED FILL — AMLODIPINE BESYLATE 5 MG TA: 5 | 90 days supply | Qty: 90 | Fill #0

## 2016-01-25 ENCOUNTER — Ambulatory Visit (INDEPENDENT_AMBULATORY_CARE_PROVIDER_SITE_OTHER): Payer: PPO | Admitting: Internal Medicine

## 2016-01-25 ENCOUNTER — Encounter: Payer: Self-pay | Admitting: Internal Medicine

## 2016-01-25 DIAGNOSIS — R05 Cough: Secondary | ICD-10-CM

## 2016-01-25 DIAGNOSIS — M1000 Idiopathic gout, unspecified site: Secondary | ICD-10-CM

## 2016-01-25 DIAGNOSIS — N182 Chronic kidney disease, stage 2 (mild): Secondary | ICD-10-CM | POA: Diagnosis not present

## 2016-01-25 DIAGNOSIS — R059 Cough, unspecified: Secondary | ICD-10-CM

## 2016-01-25 DIAGNOSIS — E1122 Type 2 diabetes mellitus with diabetic chronic kidney disease: Secondary | ICD-10-CM | POA: Diagnosis not present

## 2016-01-25 DIAGNOSIS — N400 Enlarged prostate without lower urinary tract symptoms: Secondary | ICD-10-CM

## 2016-01-25 NOTE — Progress Notes (Signed)
Subjective:  Patient ID: Douglas Richards, male    DOB: Nov 30, 1945  Age: 70 y.o. MRN: [id number]  CC: No chief complaint on file.   HPI Douglas Richards presents for DM, HTN, GERD, dyslipidemia f/u  Outpatient Medications Prior to Visit  Medication Sig Dispense Refill  . allopurinol (ZYLOPRIM) 300 MG tablet Take 1 tablet (300 mg total) by mouth daily. 90 tablet 3  . amLODipine (NORVASC) 5 MG tablet TAKE 1 TABLET BY MOUTH ONCE DAILY 90 tablet 1  . aspirin 325 MG tablet Take 325 mg by mouth daily.      . Cholecalciferol (VITAMIN D3) 1000 UNITS tablet Take 1,000 Units by mouth daily.      . CRESTOR 10 MG tablet TAKE 1 TABLET BY MOUTH ONCE DAILY 90 tablet 3  . diazepam (VALIUM) 5 MG tablet Take 0.5-1 tablets (2.5-5 mg total) by mouth every 12 (twelve) hours as needed for muscle spasms. 60 tablet 1  . Diclofenac Sodium 1.5 % SOLN Place onto the skin 3 (three) times daily.      . fluticasone (FLONASE) 50 MCG/ACT nasal spray Place 2 sprays into the nose daily. 48 g g  . Glucosamine-Chondroitin ([employer] GLUCOSAMINE-CHONDROITIN) 750-600 MG TABS Take by mouth 2 (two) times daily. Reported on 04/28/2015    . glucose blood test strip Use as instructed 100 each 3  . HYDROCORTISONE ACE, RECTAL, (HEMRIL-30) 30 MG SUPP Place 1 suppository (30 mg total) rectally 2 (two) times daily. 60 each 1  . hydrOXYzine (ATARAX/VISTARIL) 25 MG tablet Take 1-2 tablets (25-50 mg total) by mouth every 8 (eight) hours as needed for itching. 60 tablet 1  . Lancets (FREESTYLE) lancets Check BS up to twice per day. DX: E11.9 100 each 3  . loratadine (CLARITIN) 10 MG tablet Take 10 mg by mouth daily.    . metFORMIN (GLUCOPHAGE) 500 MG tablet Take 1 tablet (500 mg total) by mouth 2 (two) times daily with a meal. 180 tablet 3  . omeprazole (PRILOSEC) 20 MG capsule TAKE 1 CAPSULE BY MOUTH ONCE DAILY 90 capsule 3  . trandolapril (MAVIK) 4 MG tablet TAKE 1 TABLET BY MOUTH ONCE DAILY 90 tablet 3  . triamcinolone cream (KENALOG) 0.1 %  Apply 1 application topically 2 (two) times daily. 90 g 3   No facility-administered medications prior to visit.     ROS Review of Systems  Constitutional: Negative for appetite change, fatigue and unexpected weight change.  HENT: Negative for congestion, nosebleeds, sneezing, sore throat and trouble swallowing.   Eyes: Negative for itching and visual disturbance.  Respiratory: Negative for cough.   Cardiovascular: Negative for chest pain, palpitations and leg swelling.  Gastrointestinal: Negative for abdominal distention, blood in stool, diarrhea and nausea.  Genitourinary: Negative for frequency and hematuria.  Musculoskeletal: Negative for back pain, gait problem, joint swelling and neck pain.  Skin: Negative for rash.  Neurological: Negative for dizziness, tremors, speech difficulty and weakness.  Psychiatric/Behavioral: Negative for agitation, dysphoric mood and sleep disturbance. The patient is not nervous/anxious.     Objective:  BP 130/80   Pulse 83   Wt 171 lb (77.6 kg)   SpO2 97%   BMI 26.00 kg/m   BP Readings from Last 3 Encounters:  01/25/16 130/80  09/24/15 124/80  05/25/15 130/86    Wt Readings from Last 3 Encounters:  01/25/16 171 lb (77.6 kg)  09/24/15 170 lb 8 oz (77.3 kg)  05/25/15 174 lb (78.9 kg)    Physical Exam  Constitutional: He  is oriented to person, place, and time. He appears well-developed. No distress.  NAD  HENT:  Mouth/Throat: Oropharynx is clear and moist.  Eyes: Conjunctivae are normal. Pupils are equal, round, and reactive to light.  Neck: Normal range of motion. No JVD present. No thyromegaly present.  Cardiovascular: Normal rate, regular rhythm, normal heart sounds and intact distal pulses.  Exam reveals no gallop and no friction rub.   No murmur heard. Pulmonary/Chest: Effort normal and breath sounds normal. No respiratory distress. He has no wheezes. He has no rales. He exhibits no tenderness.  Abdominal: Soft. Bowel sounds are  normal. He exhibits no distension and no mass. There is no tenderness. There is no rebound and no guarding.  Musculoskeletal: Normal range of motion. He exhibits no edema or tenderness.  Lymphadenopathy:    He has no cervical adenopathy.  Neurological: He is alert and oriented to person, place, and time. He has normal reflexes. No cranial nerve deficit. He exhibits normal muscle tone. He displays a negative Romberg sign. Coordination and gait normal.  Skin: Skin is warm and dry. No rash noted.  Psychiatric: He has a normal mood and affect. His behavior is normal. Judgment and thought content normal.    Lab Results  Component Value Date   WBC 6.0 09/17/2015   HGB 13.5 09/17/2015   HCT 40.5 09/17/2015   PLT 151.0 09/17/2015   GLUCOSE 115 (H) 01/17/2016   CHOL 122 09/17/2015   TRIG 150.0 (H) 09/17/2015   HDL 40.30 09/17/2015   LDLDIRECT 105.5 09/10/2013   LDLCALC 51 09/17/2015   ALT 13 09/17/2015   AST 15 09/17/2015   NA 136 01/17/2016   K 4.8 01/17/2016   CL 100 01/17/2016   CREATININE 1.50 01/17/2016   BUN 22 01/17/2016   CO2 26 01/17/2016   TSH 2.65 09/17/2015   PSA 0.81 09/17/2015   HGBA1C 6.9 (H) 01/17/2016   MICROALBUR 0.4 03/31/2010    Ct Abdomen Pelvis Wo Contrast  Result Date: 11/04/2014 CLINICAL DATA:  Pain and bloating mid abdomen. Symptoms for several days, with low-grade fever. EXAM: CT ABDOMEN AND PELVIS WITHOUT CONTRAST TECHNIQUE: Multidetector CT imaging of the abdomen and pelvis was performed following the standard protocol without IV contrast. COMPARISON:  None. FINDINGS: No intrarenal or proximal ureteral calculi on either side. No evidence of hydronephrosis or other secondary signs of upper urinary tract obstruction. Within limits of unenhanced technique, no renal masses. Kidneys appear small with irregular outline suggesting chronic scarring. Atrophic pancreas. Mildly distended gallbladder, possible sludge. No definite stones. Again, within limits of unenhanced  technique, remaining visualized upper abdomen unremarkable. Visualized extreme lung bases clear. No distal ureteral calculi on either side. Appendix identified and normal. Mild prostatic enlargement without bladder obstruction. Moderately distended colon which is filled with fecal material. There is no visible fecal impaction in the rectum. Unremarkable appearing diverticulosis without signs wall thickening or diverticulitis. IMPRESSION: No evidence for bowel obstruction or colitis. Suspected constipation. No definite fecal impaction. No evidence for diverticulitis. Atrophic pancreas. Mildly distended gallbladder with possible sludge. Electronically Signed   By: Staci Righter M.D.   On: 11/04/2014 16:41    Assessment & Plan:   There are no diagnoses linked to this encounter. I am having Mr. Gavilanes maintain his aspirin, Glucosamine-Chondroitin, Diclofenac Sodium, cholecalciferol, fluticasone, HYDROCORTISONE ACE (RECTAL), hydrOXYzine, diazepam, triamcinolone cream, loratadine, glucose blood, trandolapril, allopurinol, CRESTOR, metFORMIN, freestyle, omeprazole, and amLODipine.  No orders of the defined types were placed in this encounter.    Follow-up: No  Follow-up on file.  [name], MD

## 2016-01-25 NOTE — Assessment & Plan Note (Signed)
Doing well 

## 2016-01-25 NOTE — Patient Instructions (Signed)
Search: Cough caused by ACE inhibitors

## 2016-01-25 NOTE — Assessment & Plan Note (Signed)
We discussed Trandolapril as a potential cause

## 2016-01-25 NOTE — Assessment & Plan Note (Signed)
On Allopurinol 

## 2016-01-25 NOTE — Progress Notes (Signed)
Pre visit review using our clinic review tool, if applicable. No additional management support is needed unless otherwise documented below in the visit note. 

## 2016-01-25 NOTE — Assessment & Plan Note (Signed)
On Metformin 

## 2016-02-06 MED FILL — OMEPRAZOLE 20 MG CAPSULE DR: 20 | 90 days supply | Qty: 90 | Fill #1

## 2016-02-06 MED FILL — metFORMIN HCL 500 MG TABS: 500 | 90 days supply | Qty: 180 | Fill #3

## 2016-02-28 ENCOUNTER — Other Ambulatory Visit: Payer: Self-pay | Admitting: Internal Medicine

## 2016-02-28 MED FILL — TRANDOLAPRIL 4 MG TABLET: 4 | 90 days supply | Qty: 90 | Fill #0

## 2016-03-20 ENCOUNTER — Other Ambulatory Visit: Payer: Self-pay | Admitting: Internal Medicine

## 2016-03-20 MED FILL — ALLOPURINOL 300 MG TABLET: 300 | 90 days supply | Qty: 90 | Fill #0

## 2016-03-27 ENCOUNTER — Other Ambulatory Visit: Payer: Self-pay | Admitting: Internal Medicine

## 2016-03-27 MED FILL — ROSUVASTATIN CALCIUM 10 MG: 10 | 90 days supply | Qty: 90 | Fill #0

## 2016-04-04 DIAGNOSIS — H2512 Age-related nuclear cataract, left eye: Secondary | ICD-10-CM | POA: Diagnosis not present

## 2016-04-04 DIAGNOSIS — H35373 Puckering of macula, bilateral: Secondary | ICD-10-CM | POA: Diagnosis not present

## 2016-04-04 DIAGNOSIS — Z961 Presence of intraocular lens: Secondary | ICD-10-CM | POA: Diagnosis not present

## 2016-04-04 DIAGNOSIS — H18413 Arcus senilis, bilateral: Secondary | ICD-10-CM | POA: Diagnosis not present

## 2016-04-04 DIAGNOSIS — H43811 Vitreous degeneration, right eye: Secondary | ICD-10-CM | POA: Diagnosis not present

## 2016-04-10 MED FILL — AMLODIPINE BESYLATE 5 MG TA: 5 | 90 days supply | Qty: 90 | Fill #1

## 2016-05-02 ENCOUNTER — Other Ambulatory Visit: Payer: Self-pay | Admitting: Internal Medicine

## 2016-05-02 MED FILL — metFORMIN HCL 500 MG TABS: 500 | 90 days supply | Qty: 180 | Fill #0

## 2016-05-02 MED FILL — OMEPRAZOLE 20 MG CAPSULE DR: 20 | 90 days supply | Qty: 90 | Fill #2

## 2016-05-11 DIAGNOSIS — H2512 Age-related nuclear cataract, left eye: Secondary | ICD-10-CM | POA: Diagnosis not present

## 2016-05-11 DIAGNOSIS — Z961 Presence of intraocular lens: Secondary | ICD-10-CM | POA: Diagnosis not present

## 2016-05-12 ENCOUNTER — Ambulatory Visit (INDEPENDENT_AMBULATORY_CARE_PROVIDER_SITE_OTHER)
Admission: RE | Admit: 2016-05-12 | Discharge: 2016-05-12 | Disposition: A | Discharge status: Home / Self Care | Payer: PPO | Source: Ambulatory Visit | Attending: Internal Medicine | Admitting: Internal Medicine

## 2016-05-12 ENCOUNTER — Encounter: Payer: Self-pay | Admitting: Internal Medicine

## 2016-05-12 ENCOUNTER — Ambulatory Visit (INDEPENDENT_AMBULATORY_CARE_PROVIDER_SITE_OTHER): Payer: PPO | Admitting: Internal Medicine

## 2016-05-12 DIAGNOSIS — M25511 Pain in right shoulder: Secondary | ICD-10-CM | POA: Diagnosis not present

## 2016-05-12 MED ORDER — HYDROCODONE-ACETAMINOPHEN 5-325 MG PO TABS: 0.5000 | ORAL_TABLET | Freq: Four times a day (QID) | ORAL | 0 refills | Status: DC | PRN

## 2016-05-12 MED FILL — HYDROCODON-APAP 5-325: 5-325 | 5 days supply | Qty: 20 | Fill #0

## 2016-05-12 NOTE — Assessment & Plan Note (Addendum)
A new rotator cuff injury X ray Ortho ref Norco prn  Potential benefits of opioids use as well as potential risks (i.e. addiction risk, apnea etc) and complications (i.e. Somnolence, constipation and others) were explained to the patient and were aknowledged.

## 2016-05-12 NOTE — Progress Notes (Signed)
Subjective:  Patient ID: Douglas Richards, male    DOB: August 08, 1945  Age: 71 y.o. MRN: [id number]  CC: Shoulder Injury (Pt stated injured rt shoulder is painful for 3 days)   HPI Douglas Richards presents for severe R shoulder "popped" 3 d ago while moving an umbrella stand Pain is 2-3/10 at rest, worse w/ROM; can't sleep...  Outpatient Medications Prior to Visit  Medication Sig Dispense Refill  . allopurinol (ZYLOPRIM) 300 MG tablet TAKE 1 TABLET BY MOUTH ONCE DAILY 90 tablet 1  . amLODipine (NORVASC) 5 MG tablet TAKE 1 TABLET BY MOUTH ONCE DAILY 90 tablet 1  . aspirin 325 MG tablet Take 325 mg by mouth daily.      . Cholecalciferol (VITAMIN D3) 1000 UNITS tablet Take 1,000 Units by mouth daily.      . diazepam (VALIUM) 5 MG tablet Take 0.5-1 tablets (2.5-5 mg total) by mouth every 12 (twelve) hours as needed for muscle spasms. 60 tablet 1  . Diclofenac Sodium 1.5 % SOLN Place onto the skin 3 (three) times daily.      . fluticasone (FLONASE) 50 MCG/ACT nasal spray Place 2 sprays into the nose daily. 48 g g  . Glucosamine-Chondroitin ([employer] GLUCOSAMINE-CHONDROITIN) 750-600 MG TABS Take by mouth 2 (two) times daily. Reported on 04/28/2015    . glucose blood test strip Use as instructed 100 each 3  . HYDROCORTISONE ACE, RECTAL, (HEMRIL-30) 30 MG SUPP Place 1 suppository (30 mg total) rectally 2 (two) times daily. 60 each 1  . hydrOXYzine (ATARAX/VISTARIL) 25 MG tablet Take 1-2 tablets (25-50 mg total) by mouth every 8 (eight) hours as needed for itching. 60 tablet 1  . Lancets (FREESTYLE) lancets Check BS up to twice per day. DX: E11.9 100 each 3  . loratadine (CLARITIN) 10 MG tablet Take 10 mg by mouth daily.    . metFORMIN (GLUCOPHAGE) 500 MG tablet TAKE 1 TABLET BY MOUTH TWICE A DAY WITH MEALS 180 tablet 1  . omeprazole (PRILOSEC) 20 MG capsule TAKE 1 CAPSULE BY MOUTH ONCE DAILY 90 capsule 3  . rosuvastatin (CRESTOR) 10 MG tablet TAKE 1 TABLET BY MOUTH ONCE DAILY 90 tablet 1  . trandolapril  (MAVIK) 4 MG tablet TAKE 1 TABLET BY MOUTH ONCE DAILY 90 tablet 3  . triamcinolone cream (KENALOG) 0.1 % Apply 1 application topically 2 (two) times daily. 90 g 3   No facility-administered medications prior to visit.     ROS Review of Systems  Constitutional: Negative for fatigue.  Musculoskeletal: Positive for arthralgias.  Psychiatric/Behavioral: The patient is not nervous/anxious.     Objective:  BP 124/88   Pulse 89   Temp 97.6 F (36.4 C)   Wt 185 lb (83.9 kg)   SpO2 99%   BMI 28.13 kg/m   BP Readings from Last 3 Encounters:  05/12/16 124/88  01/25/16 130/80  09/24/15 124/80    Wt Readings from Last 3 Encounters:  05/12/16 185 lb (83.9 kg)  01/25/16 171 lb (77.6 kg)  09/24/15 170 lb 8 oz (77.3 kg)    Physical Exam  Constitutional: He is oriented to person, place, and time. He appears well-developed. No distress.  NAD  HENT:  Mouth/Throat: Oropharynx is clear and moist.  Eyes: Conjunctivae are normal. Pupils are equal, round, and reactive to light.  Neck: Normal range of motion. No JVD present. No thyromegaly present.  Cardiovascular: Normal rate, regular rhythm, normal heart sounds and intact distal pulses.  Exam reveals no gallop and no friction  rub.   No murmur heard. Pulmonary/Chest: Effort normal and breath sounds normal. No respiratory distress. He has no wheezes. He has no rales. He exhibits no tenderness.  Abdominal: Soft. Bowel sounds are normal. He exhibits no distension and no mass. There is no tenderness. There is no rebound and no guarding.  Musculoskeletal: Normal range of motion. He exhibits tenderness. He exhibits no edema.  Lymphadenopathy:    He has no cervical adenopathy.  Neurological: He is alert and oriented to person, place, and time. He has normal reflexes. No cranial nerve deficit. He exhibits normal muscle tone. He displays a negative Romberg sign. Coordination and gait normal.  Skin: Skin is warm and dry. No rash noted.  Psychiatric:  He has a normal mood and affect. His behavior is normal. Judgment and thought content normal.  R shoulder w/severe pain w/minimal ROM  FTF>20 min discussing his shoulder injury  Lab Results  Component Value Date   WBC 6.0 09/17/2015   HGB 13.5 09/17/2015   HCT 40.5 09/17/2015   PLT 151.0 09/17/2015   GLUCOSE 115 (H) 01/17/2016   CHOL 122 09/17/2015   TRIG 150.0 (H) 09/17/2015   HDL 40.30 09/17/2015   LDLDIRECT 105.5 09/10/2013   LDLCALC 51 09/17/2015   ALT 13 09/17/2015   AST 15 09/17/2015   NA 136 01/17/2016   K 4.8 01/17/2016   CL 100 01/17/2016   CREATININE 1.50 01/17/2016   BUN 22 01/17/2016   CO2 26 01/17/2016   TSH 2.65 09/17/2015   PSA 0.81 09/17/2015   HGBA1C 6.9 (H) 01/17/2016   MICROALBUR 0.4 03/31/2010    Ct Abdomen Pelvis Wo Contrast  Result Date: 11/04/2014 CLINICAL DATA:  Pain and bloating mid abdomen. Symptoms for several days, with low-grade fever. EXAM: CT ABDOMEN AND PELVIS WITHOUT CONTRAST TECHNIQUE: Multidetector CT imaging of the abdomen and pelvis was performed following the standard protocol without IV contrast. COMPARISON:  None. FINDINGS: No intrarenal or proximal ureteral calculi on either side. No evidence of hydronephrosis or other secondary signs of upper urinary tract obstruction. Within limits of unenhanced technique, no renal masses. Kidneys appear small with irregular outline suggesting chronic scarring. Atrophic pancreas. Mildly distended gallbladder, possible sludge. No definite stones. Again, within limits of unenhanced technique, remaining visualized upper abdomen unremarkable. Visualized extreme lung bases clear. No distal ureteral calculi on either side. Appendix identified and normal. Mild prostatic enlargement without bladder obstruction. Moderately distended colon which is filled with fecal material. There is no visible fecal impaction in the rectum. Unremarkable appearing diverticulosis without signs wall thickening or diverticulitis.  IMPRESSION: No evidence for bowel obstruction or colitis. Suspected constipation. No definite fecal impaction. No evidence for diverticulitis. Atrophic pancreas. Mildly distended gallbladder with possible sludge. Electronically Signed   By: Staci Righter M.D.   On: 11/04/2014 16:41    Assessment & Plan:   There are no diagnoses linked to this encounter. I am having Mr. Gaba maintain his aspirin, Glucosamine-Chondroitin, Diclofenac Sodium, cholecalciferol, fluticasone, HYDROCORTISONE ACE (RECTAL), hydrOXYzine, diazepam, triamcinolone cream, loratadine, glucose blood, freestyle, omeprazole, amLODipine, trandolapril, allopurinol, rosuvastatin, and metFORMIN.  No orders of the defined types were placed in this encounter.    Follow-up: No Follow-up on file.  [name], MD

## 2016-05-16 DIAGNOSIS — S46011A Strain of muscle(s) and tendon(s) of the rotator cuff of right shoulder, initial encounter: Secondary | ICD-10-CM | POA: Diagnosis not present

## 2016-05-17 DIAGNOSIS — M25511 Pain in right shoulder: Secondary | ICD-10-CM | POA: Diagnosis not present

## 2016-05-18 DIAGNOSIS — S46011A Strain of muscle(s) and tendon(s) of the rotator cuff of right shoulder, initial encounter: Secondary | ICD-10-CM | POA: Diagnosis not present

## 2016-05-19 DIAGNOSIS — S46011D Strain of muscle(s) and tendon(s) of the rotator cuff of right shoulder, subsequent encounter: Secondary | ICD-10-CM | POA: Diagnosis not present

## 2016-05-22 DIAGNOSIS — S46011A Strain of muscle(s) and tendon(s) of the rotator cuff of right shoulder, initial encounter: Secondary | ICD-10-CM | POA: Diagnosis not present

## 2016-05-22 DIAGNOSIS — M24111 Other articular cartilage disorders, right shoulder: Secondary | ICD-10-CM | POA: Diagnosis not present

## 2016-05-22 DIAGNOSIS — M75121 Complete rotator cuff tear or rupture of right shoulder, not specified as traumatic: Secondary | ICD-10-CM | POA: Diagnosis not present

## 2016-05-22 DIAGNOSIS — M7541 Impingement syndrome of right shoulder: Secondary | ICD-10-CM | POA: Diagnosis not present

## 2016-05-22 DIAGNOSIS — M7551 Bursitis of right shoulder: Secondary | ICD-10-CM | POA: Diagnosis not present

## 2016-05-22 DIAGNOSIS — M19011 Primary osteoarthritis, right shoulder: Secondary | ICD-10-CM | POA: Diagnosis not present

## 2016-05-22 DIAGNOSIS — Y999 Unspecified external cause status: Secondary | ICD-10-CM | POA: Diagnosis not present

## 2016-05-22 DIAGNOSIS — G8918 Other acute postprocedural pain: Secondary | ICD-10-CM | POA: Diagnosis not present

## 2016-05-22 MED FILL — OXYCODONE-ACETAMINOPHEN 5-3: 5-325 | 10 days supply | Qty: 60 | Fill #0

## 2016-05-25 ENCOUNTER — Ambulatory Visit: Payer: PPO | Admitting: Internal Medicine

## 2016-05-26 MED FILL — HYDROCODON-APAP 5-325: 5-325 | 11 days supply | Qty: 42 | Fill #0

## 2016-06-06 DIAGNOSIS — M19011 Primary osteoarthritis, right shoulder: Secondary | ICD-10-CM | POA: Diagnosis not present

## 2016-06-06 MED FILL — METHOCARBAMOL 500 MG TABLET: 500 | 20 days supply | Qty: 60 | Fill #0

## 2016-06-06 MED FILL — HYDROCODON-APAP 5-325: 5-325 | 11 days supply | Qty: 42 | Fill #0

## 2016-06-11 MED FILL — TRANDOLAPRIL 4 MG TABLET: 4 | 90 days supply | Qty: 90 | Fill #1

## 2016-06-12 MED FILL — ALLOPURINOL 300 MG TABLET: 300 | 90 days supply | Qty: 90 | Fill #1

## 2016-06-14 DIAGNOSIS — M75111 Incomplete rotator cuff tear or rupture of right shoulder, not specified as traumatic: Secondary | ICD-10-CM | POA: Diagnosis not present

## 2016-06-14 DIAGNOSIS — M25511 Pain in right shoulder: Secondary | ICD-10-CM | POA: Diagnosis not present

## 2016-06-14 DIAGNOSIS — M25611 Stiffness of right shoulder, not elsewhere classified: Secondary | ICD-10-CM | POA: Diagnosis not present

## 2016-06-15 DIAGNOSIS — M25511 Pain in right shoulder: Secondary | ICD-10-CM | POA: Diagnosis not present

## 2016-06-15 DIAGNOSIS — M25611 Stiffness of right shoulder, not elsewhere classified: Secondary | ICD-10-CM | POA: Diagnosis not present

## 2016-06-15 DIAGNOSIS — M75111 Incomplete rotator cuff tear or rupture of right shoulder, not specified as traumatic: Secondary | ICD-10-CM | POA: Diagnosis not present

## 2016-06-15 MED FILL — HYDROCODON-APAP 5-325: 5-325 | 10 days supply | Qty: 42 | Fill #0

## 2016-06-21 DIAGNOSIS — M75111 Incomplete rotator cuff tear or rupture of right shoulder, not specified as traumatic: Secondary | ICD-10-CM | POA: Diagnosis not present

## 2016-06-21 DIAGNOSIS — M25611 Stiffness of right shoulder, not elsewhere classified: Secondary | ICD-10-CM | POA: Diagnosis not present

## 2016-06-21 DIAGNOSIS — M25511 Pain in right shoulder: Secondary | ICD-10-CM | POA: Diagnosis not present

## 2016-06-22 DIAGNOSIS — M25611 Stiffness of right shoulder, not elsewhere classified: Secondary | ICD-10-CM | POA: Diagnosis not present

## 2016-06-22 DIAGNOSIS — M25511 Pain in right shoulder: Secondary | ICD-10-CM | POA: Diagnosis not present

## 2016-06-22 DIAGNOSIS — M75111 Incomplete rotator cuff tear or rupture of right shoulder, not specified as traumatic: Secondary | ICD-10-CM | POA: Diagnosis not present

## 2016-06-23 ENCOUNTER — Other Ambulatory Visit: Payer: Self-pay | Admitting: Internal Medicine

## 2016-06-23 MED FILL — FREESTYLE LITE TEST STRIP: 50 days supply | Qty: 100 | Fill #0

## 2016-06-26 DIAGNOSIS — M25611 Stiffness of right shoulder, not elsewhere classified: Secondary | ICD-10-CM | POA: Diagnosis not present

## 2016-06-26 DIAGNOSIS — M25511 Pain in right shoulder: Secondary | ICD-10-CM | POA: Diagnosis not present

## 2016-06-26 DIAGNOSIS — M75111 Incomplete rotator cuff tear or rupture of right shoulder, not specified as traumatic: Secondary | ICD-10-CM | POA: Diagnosis not present

## 2016-06-26 MED FILL — HYDROCODON-APAP 5-325: 5-325 | 11 days supply | Qty: 42 | Fill #0

## 2016-06-26 MED FILL — ROSUVASTATIN CALCIUM 10 MG: 10 | 90 days supply | Qty: 90 | Fill #1

## 2016-06-28 DIAGNOSIS — M25511 Pain in right shoulder: Secondary | ICD-10-CM | POA: Diagnosis not present

## 2016-06-28 DIAGNOSIS — M25611 Stiffness of right shoulder, not elsewhere classified: Secondary | ICD-10-CM | POA: Diagnosis not present

## 2016-06-28 DIAGNOSIS — M75111 Incomplete rotator cuff tear or rupture of right shoulder, not specified as traumatic: Secondary | ICD-10-CM | POA: Diagnosis not present

## 2016-06-29 DIAGNOSIS — M25511 Pain in right shoulder: Secondary | ICD-10-CM | POA: Diagnosis not present

## 2016-06-29 DIAGNOSIS — M75111 Incomplete rotator cuff tear or rupture of right shoulder, not specified as traumatic: Secondary | ICD-10-CM | POA: Diagnosis not present

## 2016-06-29 DIAGNOSIS — M25611 Stiffness of right shoulder, not elsewhere classified: Secondary | ICD-10-CM | POA: Diagnosis not present

## 2016-07-05 ENCOUNTER — Other Ambulatory Visit: Payer: Self-pay | Admitting: Internal Medicine

## 2016-07-05 DIAGNOSIS — M25511 Pain in right shoulder: Secondary | ICD-10-CM | POA: Diagnosis not present

## 2016-07-05 DIAGNOSIS — M75111 Incomplete rotator cuff tear or rupture of right shoulder, not specified as traumatic: Secondary | ICD-10-CM | POA: Diagnosis not present

## 2016-07-05 DIAGNOSIS — M25611 Stiffness of right shoulder, not elsewhere classified: Secondary | ICD-10-CM | POA: Diagnosis not present

## 2016-07-06 MED FILL — AMLODIPINE BESYLATE 5 MG TA: 5 | 90 days supply | Qty: 90 | Fill #0

## 2016-07-07 DIAGNOSIS — M25511 Pain in right shoulder: Secondary | ICD-10-CM | POA: Diagnosis not present

## 2016-07-07 DIAGNOSIS — M75111 Incomplete rotator cuff tear or rupture of right shoulder, not specified as traumatic: Secondary | ICD-10-CM | POA: Diagnosis not present

## 2016-07-07 DIAGNOSIS — M25611 Stiffness of right shoulder, not elsewhere classified: Secondary | ICD-10-CM | POA: Diagnosis not present

## 2016-07-07 MED FILL — HYDROCODON-APAP 5-325: 5-325 | 11 days supply | Qty: 42 | Fill #0

## 2016-07-07 MED FILL — FREESTYLE LANCETS: 50 days supply | Qty: 100 | Fill #0

## 2016-07-10 DIAGNOSIS — M25611 Stiffness of right shoulder, not elsewhere classified: Secondary | ICD-10-CM | POA: Diagnosis not present

## 2016-07-10 DIAGNOSIS — M25511 Pain in right shoulder: Secondary | ICD-10-CM | POA: Diagnosis not present

## 2016-07-10 DIAGNOSIS — M75111 Incomplete rotator cuff tear or rupture of right shoulder, not specified as traumatic: Secondary | ICD-10-CM | POA: Diagnosis not present

## 2016-07-11 DIAGNOSIS — M25511 Pain in right shoulder: Secondary | ICD-10-CM | POA: Diagnosis not present

## 2016-07-12 DIAGNOSIS — M75111 Incomplete rotator cuff tear or rupture of right shoulder, not specified as traumatic: Secondary | ICD-10-CM | POA: Diagnosis not present

## 2016-07-12 DIAGNOSIS — M25511 Pain in right shoulder: Secondary | ICD-10-CM | POA: Diagnosis not present

## 2016-07-12 DIAGNOSIS — M25611 Stiffness of right shoulder, not elsewhere classified: Secondary | ICD-10-CM | POA: Diagnosis not present

## 2016-07-13 DIAGNOSIS — M25511 Pain in right shoulder: Secondary | ICD-10-CM | POA: Diagnosis not present

## 2016-07-13 DIAGNOSIS — M25611 Stiffness of right shoulder, not elsewhere classified: Secondary | ICD-10-CM | POA: Diagnosis not present

## 2016-07-13 DIAGNOSIS — M75111 Incomplete rotator cuff tear or rupture of right shoulder, not specified as traumatic: Secondary | ICD-10-CM | POA: Diagnosis not present

## 2016-07-18 DIAGNOSIS — M25511 Pain in right shoulder: Secondary | ICD-10-CM | POA: Diagnosis not present

## 2016-07-18 DIAGNOSIS — M75111 Incomplete rotator cuff tear or rupture of right shoulder, not specified as traumatic: Secondary | ICD-10-CM | POA: Diagnosis not present

## 2016-07-18 DIAGNOSIS — M25611 Stiffness of right shoulder, not elsewhere classified: Secondary | ICD-10-CM | POA: Diagnosis not present

## 2016-07-18 MED FILL — HYDROCODON-APAP 5-325: 5-325 | 10 days supply | Qty: 42 | Fill #0

## 2016-07-19 DIAGNOSIS — M75111 Incomplete rotator cuff tear or rupture of right shoulder, not specified as traumatic: Secondary | ICD-10-CM | POA: Diagnosis not present

## 2016-07-19 DIAGNOSIS — M25611 Stiffness of right shoulder, not elsewhere classified: Secondary | ICD-10-CM | POA: Diagnosis not present

## 2016-07-19 DIAGNOSIS — M25511 Pain in right shoulder: Secondary | ICD-10-CM | POA: Diagnosis not present

## 2016-07-21 DIAGNOSIS — M75111 Incomplete rotator cuff tear or rupture of right shoulder, not specified as traumatic: Secondary | ICD-10-CM | POA: Diagnosis not present

## 2016-07-21 DIAGNOSIS — M25611 Stiffness of right shoulder, not elsewhere classified: Secondary | ICD-10-CM | POA: Diagnosis not present

## 2016-07-21 DIAGNOSIS — M25511 Pain in right shoulder: Secondary | ICD-10-CM | POA: Diagnosis not present

## 2016-07-26 DIAGNOSIS — M25611 Stiffness of right shoulder, not elsewhere classified: Secondary | ICD-10-CM | POA: Diagnosis not present

## 2016-07-26 DIAGNOSIS — M25511 Pain in right shoulder: Secondary | ICD-10-CM | POA: Diagnosis not present

## 2016-07-26 DIAGNOSIS — M75111 Incomplete rotator cuff tear or rupture of right shoulder, not specified as traumatic: Secondary | ICD-10-CM | POA: Diagnosis not present

## 2016-07-28 DIAGNOSIS — M25611 Stiffness of right shoulder, not elsewhere classified: Secondary | ICD-10-CM | POA: Diagnosis not present

## 2016-07-28 DIAGNOSIS — M75111 Incomplete rotator cuff tear or rupture of right shoulder, not specified as traumatic: Secondary | ICD-10-CM | POA: Diagnosis not present

## 2016-07-28 DIAGNOSIS — M25511 Pain in right shoulder: Secondary | ICD-10-CM | POA: Diagnosis not present

## 2016-07-28 MED FILL — HYDROCODON-APAP 5-325: 5-325 | 11 days supply | Qty: 42 | Fill #0

## 2016-07-29 MED FILL — metFORMIN HCL 500 MG TABS: 500 | 90 days supply | Qty: 180 | Fill #1

## 2016-07-29 MED FILL — OMEPRAZOLE 20 MG CAPSULE DR: 20 | 90 days supply | Qty: 90 | Fill #3

## 2016-08-01 DIAGNOSIS — M75111 Incomplete rotator cuff tear or rupture of right shoulder, not specified as traumatic: Secondary | ICD-10-CM | POA: Diagnosis not present

## 2016-08-01 DIAGNOSIS — M25511 Pain in right shoulder: Secondary | ICD-10-CM | POA: Diagnosis not present

## 2016-08-01 DIAGNOSIS — M25611 Stiffness of right shoulder, not elsewhere classified: Secondary | ICD-10-CM | POA: Diagnosis not present

## 2016-08-04 DIAGNOSIS — M75111 Incomplete rotator cuff tear or rupture of right shoulder, not specified as traumatic: Secondary | ICD-10-CM | POA: Diagnosis not present

## 2016-08-04 DIAGNOSIS — M25611 Stiffness of right shoulder, not elsewhere classified: Secondary | ICD-10-CM | POA: Diagnosis not present

## 2016-08-04 DIAGNOSIS — M25511 Pain in right shoulder: Secondary | ICD-10-CM | POA: Diagnosis not present

## 2016-08-07 MED FILL — HYDROCODON-APAP 5-325: 5-325 | 11 days supply | Qty: 42 | Fill #0

## 2016-08-10 DIAGNOSIS — M75111 Incomplete rotator cuff tear or rupture of right shoulder, not specified as traumatic: Secondary | ICD-10-CM | POA: Diagnosis not present

## 2016-08-10 DIAGNOSIS — M25611 Stiffness of right shoulder, not elsewhere classified: Secondary | ICD-10-CM | POA: Diagnosis not present

## 2016-08-10 DIAGNOSIS — M25511 Pain in right shoulder: Secondary | ICD-10-CM | POA: Diagnosis not present

## 2016-08-15 DIAGNOSIS — M75111 Incomplete rotator cuff tear or rupture of right shoulder, not specified as traumatic: Secondary | ICD-10-CM | POA: Diagnosis not present

## 2016-08-15 DIAGNOSIS — M25511 Pain in right shoulder: Secondary | ICD-10-CM | POA: Diagnosis not present

## 2016-08-15 DIAGNOSIS — M25611 Stiffness of right shoulder, not elsewhere classified: Secondary | ICD-10-CM | POA: Diagnosis not present

## 2016-08-17 DIAGNOSIS — M75111 Incomplete rotator cuff tear or rupture of right shoulder, not specified as traumatic: Secondary | ICD-10-CM | POA: Diagnosis not present

## 2016-08-17 DIAGNOSIS — M25511 Pain in right shoulder: Secondary | ICD-10-CM | POA: Diagnosis not present

## 2016-08-17 DIAGNOSIS — M25611 Stiffness of right shoulder, not elsewhere classified: Secondary | ICD-10-CM | POA: Diagnosis not present

## 2016-08-17 MED FILL — HYDROCODON-APAP 5-325: 5-325 | 11 days supply | Qty: 42 | Fill #0

## 2016-08-21 DIAGNOSIS — M25611 Stiffness of right shoulder, not elsewhere classified: Secondary | ICD-10-CM | POA: Diagnosis not present

## 2016-08-21 DIAGNOSIS — M25511 Pain in right shoulder: Secondary | ICD-10-CM | POA: Diagnosis not present

## 2016-08-21 DIAGNOSIS — M75111 Incomplete rotator cuff tear or rupture of right shoulder, not specified as traumatic: Secondary | ICD-10-CM | POA: Diagnosis not present

## 2016-08-22 DIAGNOSIS — M25511 Pain in right shoulder: Secondary | ICD-10-CM | POA: Diagnosis not present

## 2016-08-24 DIAGNOSIS — M25611 Stiffness of right shoulder, not elsewhere classified: Secondary | ICD-10-CM | POA: Diagnosis not present

## 2016-08-24 DIAGNOSIS — M75111 Incomplete rotator cuff tear or rupture of right shoulder, not specified as traumatic: Secondary | ICD-10-CM | POA: Diagnosis not present

## 2016-08-24 DIAGNOSIS — M25511 Pain in right shoulder: Secondary | ICD-10-CM | POA: Diagnosis not present

## 2016-08-29 DIAGNOSIS — M25511 Pain in right shoulder: Secondary | ICD-10-CM | POA: Diagnosis not present

## 2016-08-29 DIAGNOSIS — M75111 Incomplete rotator cuff tear or rupture of right shoulder, not specified as traumatic: Secondary | ICD-10-CM | POA: Diagnosis not present

## 2016-08-29 DIAGNOSIS — M25611 Stiffness of right shoulder, not elsewhere classified: Secondary | ICD-10-CM | POA: Diagnosis not present

## 2016-08-31 DIAGNOSIS — M75111 Incomplete rotator cuff tear or rupture of right shoulder, not specified as traumatic: Secondary | ICD-10-CM | POA: Diagnosis not present

## 2016-08-31 DIAGNOSIS — M25511 Pain in right shoulder: Secondary | ICD-10-CM | POA: Diagnosis not present

## 2016-08-31 DIAGNOSIS — M25611 Stiffness of right shoulder, not elsewhere classified: Secondary | ICD-10-CM | POA: Diagnosis not present

## 2016-09-05 DIAGNOSIS — M25511 Pain in right shoulder: Secondary | ICD-10-CM | POA: Diagnosis not present

## 2016-09-05 DIAGNOSIS — M75111 Incomplete rotator cuff tear or rupture of right shoulder, not specified as traumatic: Secondary | ICD-10-CM | POA: Diagnosis not present

## 2016-09-05 DIAGNOSIS — M25611 Stiffness of right shoulder, not elsewhere classified: Secondary | ICD-10-CM | POA: Diagnosis not present

## 2016-09-07 DIAGNOSIS — M75111 Incomplete rotator cuff tear or rupture of right shoulder, not specified as traumatic: Secondary | ICD-10-CM | POA: Diagnosis not present

## 2016-09-07 DIAGNOSIS — M25611 Stiffness of right shoulder, not elsewhere classified: Secondary | ICD-10-CM | POA: Diagnosis not present

## 2016-09-07 DIAGNOSIS — M25511 Pain in right shoulder: Secondary | ICD-10-CM | POA: Diagnosis not present

## 2016-09-09 MED FILL — TRANDOLAPRIL 4 MG TABLET: 4 | 90 days supply | Qty: 90 | Fill #2

## 2016-09-11 ENCOUNTER — Other Ambulatory Visit: Payer: Self-pay | Admitting: Internal Medicine

## 2016-09-12 MED FILL — ALLOPURINOL 300 MG TAB: 300 | 90 days supply | Qty: 90 | Fill #0

## 2016-09-13 DIAGNOSIS — M25511 Pain in right shoulder: Secondary | ICD-10-CM | POA: Diagnosis not present

## 2016-09-13 DIAGNOSIS — M25661 Stiffness of right knee, not elsewhere classified: Secondary | ICD-10-CM | POA: Diagnosis not present

## 2016-09-13 DIAGNOSIS — M75111 Incomplete rotator cuff tear or rupture of right shoulder, not specified as traumatic: Secondary | ICD-10-CM | POA: Diagnosis not present

## 2016-09-14 DIAGNOSIS — M25611 Stiffness of right shoulder, not elsewhere classified: Secondary | ICD-10-CM | POA: Diagnosis not present

## 2016-09-14 DIAGNOSIS — M75111 Incomplete rotator cuff tear or rupture of right shoulder, not specified as traumatic: Secondary | ICD-10-CM | POA: Diagnosis not present

## 2016-09-14 DIAGNOSIS — M25511 Pain in right shoulder: Secondary | ICD-10-CM | POA: Diagnosis not present

## 2016-09-19 DIAGNOSIS — M25511 Pain in right shoulder: Secondary | ICD-10-CM | POA: Diagnosis not present

## 2016-09-25 ENCOUNTER — Other Ambulatory Visit (INDEPENDENT_AMBULATORY_CARE_PROVIDER_SITE_OTHER): Payer: PPO

## 2016-09-25 DIAGNOSIS — E119 Type 2 diabetes mellitus without complications: Secondary | ICD-10-CM

## 2016-09-25 LAB — BASIC METABOLIC PANEL
BUN: 15 mg/dL (ref 6–23)
CALCIUM: 9.5 mg/dL (ref 8.4–10.5)
CO2: 22 mEq/L (ref 19–32)
CREATININE: 1.47 mg/dL (ref 0.40–1.50)
Chloride: 100 mEq/L (ref 96–112)
GFR: 50.19 mL/min — AB (ref >60.00)
Glucose, Bld: 151 mg/dL — ABNORMAL HIGH (ref 70–99)
Potassium: 4.4 mEq/L (ref 3.5–5.1)
Sodium: 135 mEq/L (ref 135–145)

## 2016-09-25 LAB — HEMOGLOBIN A1C: HEMOGLOBIN A1C: 7 % — AB (ref 4.6–6.5)

## 2016-09-27 ENCOUNTER — Other Ambulatory Visit: Payer: Self-pay | Admitting: Internal Medicine

## 2016-09-28 DIAGNOSIS — M25611 Stiffness of right shoulder, not elsewhere classified: Secondary | ICD-10-CM | POA: Diagnosis not present

## 2016-09-28 DIAGNOSIS — M25511 Pain in right shoulder: Secondary | ICD-10-CM | POA: Diagnosis not present

## 2016-09-28 DIAGNOSIS — M75111 Incomplete rotator cuff tear or rupture of right shoulder, not specified as traumatic: Secondary | ICD-10-CM | POA: Diagnosis not present

## 2016-09-29 ENCOUNTER — Ambulatory Visit (INDEPENDENT_AMBULATORY_CARE_PROVIDER_SITE_OTHER): Payer: PPO | Admitting: Internal Medicine

## 2016-09-29 ENCOUNTER — Encounter: Payer: Self-pay | Admitting: Internal Medicine

## 2016-09-29 VITALS — BP 138/86 | HR 87 | Temp 98.4°F | Ht 68.0 in | Wt 175.0 lb

## 2016-09-29 DIAGNOSIS — I10 Essential (primary) hypertension: Secondary | ICD-10-CM | POA: Diagnosis not present

## 2016-09-29 DIAGNOSIS — M25511 Pain in right shoulder: Secondary | ICD-10-CM | POA: Diagnosis not present

## 2016-09-29 DIAGNOSIS — N182 Chronic kidney disease, stage 2 (mild): Secondary | ICD-10-CM | POA: Diagnosis not present

## 2016-09-29 DIAGNOSIS — Z23 Encounter for immunization: Secondary | ICD-10-CM | POA: Diagnosis not present

## 2016-09-29 DIAGNOSIS — M1000 Idiopathic gout, unspecified site: Secondary | ICD-10-CM

## 2016-09-29 DIAGNOSIS — E1122 Type 2 diabetes mellitus with diabetic chronic kidney disease: Secondary | ICD-10-CM

## 2016-09-29 MED FILL — ROSUVASTATIN CALCIUM 10 MG: 10 | 90 days supply | Qty: 90 | Fill #0

## 2016-09-29 NOTE — Assessment & Plan Note (Signed)
No relapse 

## 2016-09-29 NOTE — Assessment & Plan Note (Signed)
Amlodipine.

## 2016-09-29 NOTE — Assessment & Plan Note (Signed)
Metformin 

## 2016-09-29 NOTE — Assessment & Plan Note (Addendum)
rotator cuff injury - s/p repair B complex 1 a day

## 2016-09-29 NOTE — Progress Notes (Signed)
Subjective:  Patient ID: Douglas Richards, male    DOB: 1946-01-05  Age: 71 y.o. MRN: [id number]  CC: No chief complaint on file.   HPI DYLLON HENKEN presents for gout, DM, R shoulder pain f/u  Outpatient Medications Prior to Visit  Medication Sig Dispense Refill  . allopurinol (ZYLOPRIM) 300 MG tablet TAKE 1 TABLET BY MOUTH ONCE DAILY 90 tablet 2  . amLODipine (NORVASC) 5 MG tablet TAKE 1 TABLET BY MOUTH ONCE DAILY 90 tablet 1  . aspirin 325 MG tablet Take 325 mg by mouth daily.      . Cholecalciferol (VITAMIN D3) 1000 UNITS tablet Take 1,000 Units by mouth daily.      . Glucosamine-Chondroitin ([employer] GLUCOSAMINE-CHONDROITIN) 750-600 MG TABS Take by mouth 2 (two) times daily. Reported on 04/28/2015    . glucose blood (FREESTYLE LITE) test strip Use to check blood sugars twice a day 100 each 3  . HYDROCORTISONE ACE, RECTAL, (HEMRIL-30) 30 MG SUPP Place 1 suppository (30 mg total) rectally 2 (two) times daily. 60 each 1  . hydrOXYzine (ATARAX/VISTARIL) 25 MG tablet Take 1-2 tablets (25-50 mg total) by mouth every 8 (eight) hours as needed for itching. 60 tablet 1  . Lancets (FREESTYLE) lancets Check BS up to twice per day. DX: E11.9 100 each 3  . loratadine (CLARITIN) 10 MG tablet Take 10 mg by mouth daily.    . metFORMIN (GLUCOPHAGE) 500 MG tablet TAKE 1 TABLET BY MOUTH TWICE A DAY WITH MEALS 180 tablet 1  . omeprazole (PRILOSEC) 20 MG capsule TAKE 1 CAPSULE BY MOUTH ONCE DAILY 90 capsule 3  . rosuvastatin (CRESTOR) 10 MG tablet TAKE 1 TABLET BY MOUTH ONCE DAILY 90 tablet 1  . trandolapril (MAVIK) 4 MG tablet TAKE 1 TABLET BY MOUTH ONCE DAILY 90 tablet 3  . HYDROcodone-acetaminophen (NORCO) 5-325 MG tablet Take 0.5-1 tablets by mouth every 6 (six) hours as needed for severe pain. (Patient not taking: Reported on 09/29/2016) 20 tablet 0  . diazepam (VALIUM) 5 MG tablet Take 0.5-1 tablets (2.5-5 mg total) by mouth every 12 (twelve) hours as needed for muscle spasms. (Patient not taking: Reported  on 09/29/2016) 60 tablet 1  . Diclofenac Sodium 1.5 % SOLN Place onto the skin 3 (three) times daily.      . fluticasone (FLONASE) 50 MCG/ACT nasal spray Place 2 sprays into the nose daily. (Patient not taking: Reported on 09/29/2016) 48 g g  . triamcinolone cream (KENALOG) 0.1 % Apply 1 application topically 2 (two) times daily. (Patient not taking: Reported on 09/29/2016) 90 g 3   No facility-administered medications prior to visit.     ROS Review of Systems  Constitutional: Negative for appetite change, fatigue and unexpected weight change.  HENT: Negative for congestion, nosebleeds, sneezing, sore throat and trouble swallowing.   Eyes: Negative for itching and visual disturbance.  Respiratory: Negative for cough.   Cardiovascular: Negative for chest pain, palpitations and leg swelling.  Gastrointestinal: Negative for abdominal distention, blood in stool, diarrhea and nausea.  Genitourinary: Negative for frequency and hematuria.  Musculoskeletal: Positive for arthralgias. Negative for back pain, gait problem, joint swelling and neck pain.  Skin: Negative for rash.  Neurological: Negative for dizziness, tremors, speech difficulty and weakness.  Psychiatric/Behavioral: Negative for agitation, dysphoric mood and sleep disturbance. The patient is not nervous/anxious.     Objective:  BP 138/86 (BP Location: Left Arm, Patient Position: Sitting, Cuff Size: Large)   Pulse 87   Temp 98.4 F (36.9 C) (  Oral)   Ht 5\' 8"  (1.727 m)   Wt 175 lb (79.4 kg)   SpO2 99%   BMI 26.61 kg/m   BP Readings from Last 3 Encounters:  09/29/16 138/86  05/12/16 124/88  01/25/16 130/80    Wt Readings from Last 3 Encounters:  09/29/16 175 lb (79.4 kg)  05/12/16 185 lb (83.9 kg)  01/25/16 171 lb (77.6 kg)    Physical Exam  Constitutional: He is oriented to person, place, and time. He appears well-developed. No distress.  NAD  HENT:  Mouth/Throat: Oropharynx is clear and moist.  Eyes: Pupils are  equal, round, and reactive to light. Conjunctivae are normal.  Neck: Normal range of motion. No JVD present. No thyromegaly present.  Cardiovascular: Normal rate, regular rhythm, normal heart sounds and intact distal pulses.  Exam reveals no gallop and no friction rub.   No murmur heard. Pulmonary/Chest: Effort normal and breath sounds normal. No respiratory distress. He has no wheezes. He has no rales. He exhibits no tenderness.  Abdominal: Soft. Bowel sounds are normal. He exhibits no distension and no mass. There is no tenderness. There is no rebound and no guarding.  Musculoskeletal: Normal range of motion. He exhibits tenderness. He exhibits no edema.  Lymphadenopathy:    He has no cervical adenopathy.  Neurological: He is alert and oriented to person, place, and time. He has normal reflexes. No cranial nerve deficit. He exhibits normal muscle tone. He displays a negative Romberg sign. Coordination and gait normal.  Skin: Skin is warm and dry. No rash noted.  Psychiatric: He has a normal mood and affect. His behavior is normal. Judgment and thought content normal.   R shoulder is tender/scar   Lab Results  Component Value Date   WBC 6.0 09/17/2015   HGB 13.5 09/17/2015   HCT 40.5 09/17/2015   PLT 151.0 09/17/2015   GLUCOSE 151 (H) 09/25/2016   CHOL 122 09/17/2015   TRIG 150.0 (H) 09/17/2015   HDL 40.30 09/17/2015   LDLDIRECT 105.5 09/10/2013   LDLCALC 51 09/17/2015   ALT 13 09/17/2015   AST 15 09/17/2015   NA 135 09/25/2016   K 4.4 09/25/2016   CL 100 09/25/2016   CREATININE 1.47 09/25/2016   BUN 15 09/25/2016   CO2 22 09/25/2016   TSH 2.65 09/17/2015   PSA 0.81 09/17/2015   HGBA1C 7.0 (H) 09/25/2016   MICROALBUR 0.4 03/31/2010    Dg Shoulder Right  Result Date: 05/12/2016 CLINICAL DATA:  Right shoulder pain starting after lifting heavy object 2 days ago EXAM: RIGHT SHOULDER - 2+ VIEW COMPARISON:  None. FINDINGS: Three views of the right shoulder submitted. No acute  fracture or subluxation. Mild degenerative narrowing of glenohumeral joint space. Moderate degenerative changes AC joint. There is spurring of distal clavicle. Small dystrophic soft tissue calcification is noted inferior to distal shaft of the clavicle measures about 7 mm. There is high riding humeral head suspicious for rotator cuff insufficiency. IMPRESSION: 1. No acute fracture or subluxation. Degenerative changes glenohumeral joint and AC joint. Spurring of distal clavicle. High riding humeral head suspicious for rotator cuff insufficiency Electronically Signed   By: Lahoma Crocker M.D.   On: 05/12/2016 14:04    Assessment & Plan:   There are no diagnoses linked to this encounter. I have discontinued Mr. Braver Diclofenac Sodium, fluticasone, diazepam, and triamcinolone cream. I am also having him maintain his aspirin, Glucosamine-Chondroitin, cholecalciferol, HYDROCORTISONE ACE (RECTAL), hydrOXYzine, loratadine, freestyle, omeprazole, trandolapril, metFORMIN, HYDROcodone-acetaminophen, glucose blood, amLODipine, allopurinol, and  rosuvastatin.  No orders of the defined types were placed in this encounter.    Follow-up: No Follow-up on file.  [name], MD

## 2016-10-05 DIAGNOSIS — M25511 Pain in right shoulder: Secondary | ICD-10-CM | POA: Diagnosis not present

## 2016-10-05 DIAGNOSIS — M75111 Incomplete rotator cuff tear or rupture of right shoulder, not specified as traumatic: Secondary | ICD-10-CM | POA: Diagnosis not present

## 2016-10-05 DIAGNOSIS — M25611 Stiffness of right shoulder, not elsewhere classified: Secondary | ICD-10-CM | POA: Diagnosis not present

## 2016-10-07 MED FILL — AMLODIPINE BESYLATE 5 MG TA: 5 | 90 days supply | Qty: 90 | Fill #1

## 2016-10-10 DIAGNOSIS — M25611 Stiffness of right shoulder, not elsewhere classified: Secondary | ICD-10-CM | POA: Diagnosis not present

## 2016-10-10 DIAGNOSIS — M75111 Incomplete rotator cuff tear or rupture of right shoulder, not specified as traumatic: Secondary | ICD-10-CM | POA: Diagnosis not present

## 2016-10-10 DIAGNOSIS — M25511 Pain in right shoulder: Secondary | ICD-10-CM | POA: Diagnosis not present

## 2016-10-17 DIAGNOSIS — M25511 Pain in right shoulder: Secondary | ICD-10-CM | POA: Diagnosis not present

## 2016-10-23 ENCOUNTER — Other Ambulatory Visit: Payer: Self-pay | Admitting: Internal Medicine

## 2016-10-23 MED FILL — metFORMIN HCL 500 MG TABS: 500 | 90 days supply | Qty: 180 | Fill #0

## 2016-10-23 MED FILL — OMEPRAZOLE 20 MG CAP: 20 | 90 days supply | Qty: 90 | Fill #0

## 2016-11-01 NOTE — Addendum Note (Signed)
Addended by: Karren Cobble on: 11/01/2016 03:30 PM   Modules accepted: Orders

## 2016-11-14 ENCOUNTER — Ambulatory Visit (INDEPENDENT_AMBULATORY_CARE_PROVIDER_SITE_OTHER): Payer: PPO | Admitting: *Deleted

## 2016-11-14 VITALS — BP 146/85 | HR 84 | Resp 20 | Ht 68.0 in | Wt 174.0 lb

## 2016-11-14 DIAGNOSIS — Z0000 Encounter for general adult medical examination without abnormal findings: Secondary | ICD-10-CM | POA: Diagnosis not present

## 2016-11-14 NOTE — Progress Notes (Addendum)
Subjective:   Douglas Richards is a 71 y.o. male who presents for Medicare Annual/Subsequent preventive examination.  Review of Systems:  No ROS.  Medicare Wellness Visit. Additional risk factors are reflected in the social history.  Cardiac Risk Factors include: advanced age (>57men, >60 women);diabetes mellitus;dyslipidemia;hypertension Sleep patterns: feels rested on waking, gets up 1-2 times nightly to void and sleeps 9-10 hours nightly.    Home Safety/Smoke Alarms: Feels safe in home. Smoke alarms in place.  Living environment; residence and Firearm Safety: 1-story house/ trailer, no firearms. Lives with wife, no needs for DME, good support system Seat Belt Safety/Bike Helmet: Wears seat belt.      Objective:    Vitals: BP (!) 146/85   Pulse 84   Resp 20   Ht 5\' 8"  (1.727 m)   Wt 174 lb (78.9 kg)   SpO2 100%   BMI 26.46 kg/m   Body mass index is 26.46 kg/m.  Tobacco History  Smoking Status  . Never Smoker  Smokeless Tobacco  . Never Used     Counseling given: Not Answered   Past Medical History:  Diagnosis Date  . Allergic rhinitis   . Allergy   . Anal fissure   . Cataract    right eye   . Diabetes mellitus 2011   type II  . Diverticulosis of colon (without mention of hemorrhage)   . GERD (gastroesophageal reflux disease)   . Gout, unspecified   . Herpes zoster without mention of complication   . Hyperlipidemia   . Hypertension   . Osteoarthrosis, unspecified whether generalized or localized, unspecified site   . Other abnormal glucose   . Personal history of colonic polyps    Past Surgical History:  Procedure Laterality Date  . CATARACT EXTRACTION Right   . COLONOSCOPY    . HEMORRHOID SURGERY    . LIPOMA EXCISION     from the neck  . POLYPECTOMY    . rotator cuff surgery Right   . TONSILLECTOMY     Family History  Problem Relation Age of Onset  . COPD Mother   . Stroke Mother   . Hypertension Mother   . Diabetes Father   . COPD Father     . Alcohol abuse Father   . Stroke Father   . Hypertension Father   . Alcohol abuse Brother   . Arthritis Brother        lupus  . Heart disease Brother   . Hypertension Unknown        fam hx  . Heart disease Sister   . Colon cancer Neg Hx    History  Sexual Activity  . Sexual activity: Yes    Outpatient Encounter Prescriptions as of 11/14/2016  Medication Sig  . allopurinol (ZYLOPRIM) 300 MG tablet TAKE 1 TABLET BY MOUTH ONCE DAILY  . amLODipine (NORVASC) 5 MG tablet TAKE 1 TABLET BY MOUTH ONCE DAILY  . aspirin 325 MG tablet Take 325 mg by mouth daily.    . Cholecalciferol (VITAMIN D3) 1000 UNITS tablet Take 1,000 Units by mouth daily.    . Glucosamine-Chondroitin ([facility] GLUCOSAMINE-CHONDROITIN) 750-600 MG TABS Take by mouth 2 (two) times daily. Reported on 04/28/2015  . glucose blood (FREESTYLE LITE) test strip Use to check blood sugars twice a day  . HYDROCORTISONE ACE, RECTAL, (HEMRIL-30) 30 MG SUPP Place 1 suppository (30 mg total) rectally 2 (two) times daily.  . Lancets (FREESTYLE) lancets Check BS up to twice per day. DX: E11.9  .  loratadine (CLARITIN) 10 MG tablet Take 10 mg by mouth daily.  . metFORMIN (GLUCOPHAGE) 500 MG tablet TAKE 1 TABLET BY MOUTH TWICE DAILY WITH MEALS  . omeprazole (PRILOSEC) 20 MG capsule TAKE 1 CAPSULE BY MOUTH ONCE DAILY  . rosuvastatin (CRESTOR) 10 MG tablet TAKE 1 TABLET BY MOUTH ONCE DAILY  . trandolapril (MAVIK) 4 MG tablet TAKE 1 TABLET BY MOUTH ONCE DAILY  . [DISCONTINUED] HYDROcodone-acetaminophen (NORCO) 5-325 MG tablet Take 0.5-1 tablets by mouth every 6 (six) hours as needed for severe pain. (Patient not taking: Reported on 09/29/2016)  . [DISCONTINUED] hydrOXYzine (ATARAX/VISTARIL) 25 MG tablet Take 1-2 tablets (25-50 mg total) by mouth every 8 (eight) hours as needed for itching. (Patient not taking: Reported on 11/14/2016)   No facility-administered encounter medications on file as of 11/14/2016.     Activities of Daily Living In your  present state of health, do you have any difficulty performing the following activities: 11/14/2016  Hearing? N  Vision? N  Difficulty concentrating or making decisions? N  Walking or climbing stairs? N  Dressing or bathing? N  Doing errands, shopping? N  Preparing Food and eating ? N  Using the Toilet? N  In the past six months, have you accidently leaked urine? N  Do you have problems with loss of bowel control? N  Managing your Medications? N  Managing your Finances? N  Housekeeping or managing your Housekeeping? N  Some recent data might be hidden    Patient Care Team: Plotnikov, Evie Lacks, MD as PCP - General   Assessment:    Physical assessment deferred to PCP.   Exercise Activities and Dietary recommendations Current Exercise Habits: Home exercise routine, Type of exercise: walking (Continues PT exercises for shoulder), Time (Minutes): 60, Frequency (Times/Week): 4, Weekly Exercise (Minutes/Week): 240, Intensity: Mild, Exercise limited by: orthopedic condition(s)   Diet (meal preparation, eat out, water intake, caffeinated beverages, dairy products, fruits and vegetables): in general, a "healthy" diet  , well balanced, eats a variety of fruits and vegetables daily, limits salt, fat/cholesterol, sugar, caffeine, drinks 6-8 glasses of water daily.  Goals      Patient Stated   . patient (pt-stated)          Wants to come off of his medications!!!       Other   . Continue to improve the mobility of  my shoulder, increase my exercise, monitor diet closer, and continue to see Dr. Cristie Hem      Fall Risk Fall Risk  11/14/2016 09/29/2016 04/28/2015 06/30/2014  Falls in the past year? No No No No   Depression Screen PHQ 2/9 Scores 11/14/2016 09/29/2016 04/28/2015 06/30/2014  PHQ - 2 Score 0 0 0 0  PHQ- 9 Score 0 - - -    Cognitive Function MMSE - Mini Mental State Exam 11/14/2016 04/28/2015  Not completed: - (No Data)  Orientation to time 5 -  Orientation to Place 5 -    Registration 3 -  Attention/ Calculation 5 -  Recall 3 -  Language- name 2 objects 2 -  Language- repeat 1 -  Language- follow 3 step command 3 -  Language- read & follow direction 1 -  Write a sentence 1 -  Copy design 1 -  Total score 30 -        Immunization History  Administered Date(s) Administered  . Influenza Whole 11/28/2005, 11/27/2008  . Influenza, High Dose Seasonal PF 11/09/2015, 09/29/2016  . Influenza,inj,Quad PF,6+ Mos 10/28/2013, 10/21/2014  . Influenza-Unspecified  01/06/2013  . Pneumococcal Conjugate-13 01/13/2014  . Pneumococcal Polysaccharide-23 06/07/2011  . Td 07/23/2009   Screening Tests Health Maintenance  Topic Date Due  . OPHTHALMOLOGY EXAM  03/25/2015  . FOOT EXAM  09/23/2016  . HEMOGLOBIN A1C  03/28/2017  . COLONOSCOPY  01/16/2019  . TETANUS/TDAP  07/24/2019  . INFLUENZA VACCINE  Completed  . Hepatitis C Screening  Completed  . PNA vac Low Risk Adult  Completed      Plan:    Please ask Dr. Talbert Forest to send your eye exam results to Dr. Alain Marion.   Continue doing brain stimulating activities (puzzles, reading, adult coloring books, staying active) to keep memory sharp.   Continue to eat heart healthy diet (full of fruits, vegetables, whole grains, lean protein, water--limit salt, fat, and sugar intake) and increase physical activity as tolerated.  I have personally reviewed and noted the following in the patient's chart:   . Medical and social history . Use of alcohol, tobacco or illicit drugs  . Current medications and supplements . Functional ability and status . Nutritional status . Physical activity . Advanced directives . List of other physicians . Vitals . Screenings to include cognitive, depression, and falls . Referrals and appointments  In addition, I have reviewed and discussed with patient certain preventive protocols, quality metrics, and best practice recommendations. A written personalized care plan for preventive  services as well as general preventive health recommendations were provided to patient.     Michiel Cowboy, RN  11/14/2016  Medical screening examination/treatment/procedure(s) were performed by non-physician practitioner and as supervising physician I was immediately available for consultation/collaboration. I agree with above. Lew Dawes, MD

## 2016-11-14 NOTE — Patient Instructions (Addendum)
Please ask Dr. Talbert Forest to send your eye exam results to Dr. Alain Marion.   Continue doing brain stimulating activities (puzzles, reading, adult coloring books, staying active) to keep memory sharp.   Continue to eat heart healthy diet (full of fruits, vegetables, whole grains, lean protein, water--limit salt, fat, and sugar intake) and increase physical activity as tolerated.   Douglas Richards , Thank you for taking time to come for your Medicare Wellness Visit. I appreciate your ongoing commitment to your health goals. Please review the following plan we discussed and let me know if I can assist you in the future.   These are the goals we discussed: Goals      Patient Stated   . patient (pt-stated)          Wants to come off of his medications!!!       Other   . Continue to improve the mobility of  my shoulder, increase my exercise, monitor diet closer, and continue to see Dr. Harlen Labs       This is a list of the screening recommended for you and due dates:  Health Maintenance  Topic Date Due  . Eye exam for diabetics  03/25/2015  . Complete foot exam   09/23/2016  . Hemoglobin A1C  03/28/2017  . Colon Cancer Screening  01/16/2019  . Tetanus Vaccine  07/24/2019  . Flu Shot  Completed  .  Hepatitis C: One time screening is recommended by Center for Disease Control  (CDC) for  adults born from [birth year] through 1965.   Completed  . Pneumonia vaccines  Completed

## 2016-11-14 NOTE — Progress Notes (Signed)
Pre visit review using our clinic review tool, if applicable. No additional management support is needed unless otherwise documented below in the visit note. 

## 2016-12-10 MED FILL — ALLOPURINOL 300 MG TAB: 300 | 90 days supply | Qty: 90 | Fill #1

## 2016-12-11 MED FILL — TRANDOLAPRIL 4 MG TABLET: 4 | 90 days supply | Qty: 90 | Fill #3

## 2016-12-25 MED FILL — ROSUVASTATIN CALCIUM 10 MG: 10 | 90 days supply | Qty: 90 | Fill #1

## 2016-12-26 DIAGNOSIS — H25012 Cortical age-related cataract, left eye: Secondary | ICD-10-CM | POA: Diagnosis not present

## 2016-12-26 DIAGNOSIS — H2512 Age-related nuclear cataract, left eye: Secondary | ICD-10-CM | POA: Diagnosis not present

## 2016-12-26 DIAGNOSIS — Z961 Presence of intraocular lens: Secondary | ICD-10-CM | POA: Diagnosis not present

## 2016-12-26 DIAGNOSIS — H43811 Vitreous degeneration, right eye: Secondary | ICD-10-CM | POA: Diagnosis not present

## 2017-01-14 MED FILL — OMEPRAZOLE 20 MG CAP: 20 | 90 days supply | Qty: 90 | Fill #1

## 2017-01-15 ENCOUNTER — Other Ambulatory Visit: Payer: Self-pay | Admitting: Internal Medicine

## 2017-01-15 MED FILL — metFORMIN HCL 500 MG TABS: 500 | 90 days supply | Qty: 180 | Fill #1

## 2017-01-16 MED FILL — AMLODIPINE BESYLATE 5 MG TA: 5 | 90 days supply | Qty: 90 | Fill #0

## 2017-02-09 MED FILL — BESIVANCE 0.6% SUSP: 0.6 | 30 days supply | Qty: 5 | Fill #0

## 2017-02-09 MED FILL — DUREZOL 0.05% EYE DROPS: 0.05 | 30 days supply | Qty: 5 | Fill #0

## 2017-02-12 MED FILL — PROLENSA 0.07% EYE DROPS: 0.07 | 60 days supply | Qty: 3 | Fill #0

## 2017-02-13 ENCOUNTER — Ambulatory Visit: Payer: PPO | Admitting: Internal Medicine

## 2017-02-23 DIAGNOSIS — H2512 Age-related nuclear cataract, left eye: Secondary | ICD-10-CM | POA: Diagnosis not present

## 2017-03-02 DIAGNOSIS — H4911 Fourth [trochlear] nerve palsy, right eye: Secondary | ICD-10-CM | POA: Diagnosis not present

## 2017-03-02 DIAGNOSIS — R9082 White matter disease, unspecified: Secondary | ICD-10-CM | POA: Diagnosis not present

## 2017-03-05 ENCOUNTER — Other Ambulatory Visit: Payer: Self-pay | Admitting: Internal Medicine

## 2017-03-05 MED FILL — ALLOPURINOL 300 MG TAB: 300 | 90 days supply | Qty: 90 | Fill #2

## 2017-03-06 MED FILL — TRANDOLAPRIL 4 MG TABLET: 4 | 90 days supply | Qty: 90 | Fill #0

## 2017-03-22 DIAGNOSIS — H50311 Intermittent monocular esotropia, right eye: Secondary | ICD-10-CM | POA: Diagnosis not present

## 2017-03-22 DIAGNOSIS — H5022 Vertical strabismus, left eye: Secondary | ICD-10-CM | POA: Diagnosis not present

## 2017-03-22 DIAGNOSIS — H5032 Intermittent alternating esotropia: Secondary | ICD-10-CM | POA: Diagnosis not present

## 2017-03-26 ENCOUNTER — Other Ambulatory Visit: Payer: Self-pay | Admitting: Internal Medicine

## 2017-03-26 MED FILL — FREESTYLE LITE TEST STRIP: 50 days supply | Qty: 100 | Fill #1

## 2017-03-27 MED FILL — ROSUVASTATIN CALCIUM 10 MG: 10 | 90 days supply | Qty: 90 | Fill #0

## 2017-03-27 MED FILL — FREESTYLE LANCETS: 50 days supply | Qty: 100 | Fill #0

## 2017-03-28 ENCOUNTER — Other Ambulatory Visit (INDEPENDENT_AMBULATORY_CARE_PROVIDER_SITE_OTHER): Payer: PPO

## 2017-03-28 DIAGNOSIS — E118 Type 2 diabetes mellitus with unspecified complications: Secondary | ICD-10-CM | POA: Diagnosis not present

## 2017-03-28 LAB — BASIC METABOLIC PANEL
BUN: 24 mg/dL — ABNORMAL HIGH (ref 6–23)
CHLORIDE: 98 meq/L (ref 96–112)
CO2: 26 meq/L (ref 19–32)
Calcium: 9.7 mg/dL (ref 8.4–10.5)
Creatinine, Ser: 1.62 mg/dL — ABNORMAL HIGH (ref 0.40–1.50)
GFR: 44.8 mL/min — ABNORMAL LOW (ref >60.00)
Glucose, Bld: 136 mg/dL — ABNORMAL HIGH (ref 70–99)
POTASSIUM: 4.7 meq/L (ref 3.5–5.1)
SODIUM: 134 meq/L — AB (ref 135–145)

## 2017-03-28 LAB — HEMOGLOBIN A1C: Hgb A1c MFr Bld: 7.3 % — ABNORMAL HIGH (ref 4.6–6.5)

## 2017-03-30 ENCOUNTER — Ambulatory Visit: Payer: PPO | Admitting: Internal Medicine

## 2017-04-02 ENCOUNTER — Ambulatory Visit (INDEPENDENT_AMBULATORY_CARE_PROVIDER_SITE_OTHER): Payer: PPO | Admitting: Internal Medicine

## 2017-04-02 ENCOUNTER — Encounter: Payer: Self-pay | Admitting: Internal Medicine

## 2017-04-02 VITALS — BP 138/82 | HR 84 | Temp 97.9°F | Ht 68.0 in | Wt 184.0 lb

## 2017-04-02 DIAGNOSIS — M1000 Idiopathic gout, unspecified site: Secondary | ICD-10-CM

## 2017-04-02 DIAGNOSIS — I10 Essential (primary) hypertension: Secondary | ICD-10-CM | POA: Diagnosis not present

## 2017-04-02 DIAGNOSIS — N182 Chronic kidney disease, stage 2 (mild): Secondary | ICD-10-CM | POA: Diagnosis not present

## 2017-04-02 DIAGNOSIS — E1122 Type 2 diabetes mellitus with diabetic chronic kidney disease: Secondary | ICD-10-CM | POA: Diagnosis not present

## 2017-04-02 DIAGNOSIS — E785 Hyperlipidemia, unspecified: Secondary | ICD-10-CM | POA: Diagnosis not present

## 2017-04-02 MED ORDER — ASPIRIN EC 81 MG PO TBEC: 81.0000 mg | DELAYED_RELEASE_TABLET | Freq: Every day | ORAL | 3 refills | Status: AC

## 2017-04-02 NOTE — Progress Notes (Signed)
Subjective:  Patient ID: Douglas Richards, male    DOB: 1945-11-10  Age: 72 y.o. MRN: [id number]  CC: No chief complaint on file.   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 allopurinol (ZYLOPRIM) 300 MG tablet TAKE 1 TABLET BY MOUTH ONCE DAILY 90 tablet 2  . amLODipine (NORVASC) 5 MG tablet TAKE 1 TABLET BY MOUTH ONCE DAILY 90 tablet 3  . aspirin 325 MG tablet Take 325 mg by mouth daily.      . Cholecalciferol (VITAMIN D3) 1000 UNITS tablet Take 1,000 Units by mouth daily.      . Glucosamine-Chondroitin ([facility] GLUCOSAMINE-CHONDROITIN) 750-600 MG TABS Take by mouth 2 (two) times daily. Reported on 04/28/2015    . glucose blood (FREESTYLE LITE) test strip Use to check blood sugars twice a day 100 each 3  . HYDROCORTISONE ACE, RECTAL, (HEMRIL-30) 30 MG SUPP Place 1 suppository (30 mg total) rectally 2 (two) times daily. 60 each 1  . Lancets (FREESTYLE) lancets USE TO CHECK BLOOD SUGAR TWICE DAILY 100 each 3  . loratadine (CLARITIN) 10 MG tablet Take 10 mg by mouth daily.    . metFORMIN (GLUCOPHAGE) 500 MG tablet TAKE 1 TABLET BY MOUTH TWICE DAILY WITH MEALS 180 tablet 1  . omeprazole (PRILOSEC) 20 MG capsule TAKE 1 CAPSULE BY MOUTH ONCE DAILY 90 capsule 1  . rosuvastatin (CRESTOR) 10 MG tablet TAKE 1 TABLET BY MOUTH ONCE DAILY 90 tablet 3  . trandolapril (MAVIK) 4 MG tablet TAKE 1 TABLET BY MOUTH ONCE DAILY 90 tablet 3   No facility-administered medications prior to visit.     ROS Review of Systems  Constitutional: Negative for appetite change, fatigue and unexpected weight change.  HENT: Negative for congestion, nosebleeds, sneezing, sore throat and trouble swallowing.   Eyes: Positive for visual disturbance. Negative for itching.  Respiratory: Negative for cough.   Cardiovascular: Negative for  chest pain, palpitations and leg swelling.  Gastrointestinal: Negative for abdominal distention, blood in stool, diarrhea and nausea.  Genitourinary: Negative for frequency and hematuria.  Musculoskeletal: Negative for back pain, gait problem, joint swelling and neck pain.  Skin: Negative for rash.  Neurological: Negative for dizziness, tremors, speech difficulty and weakness.  Psychiatric/Behavioral: Negative for agitation, dysphoric mood and sleep disturbance. The patient is not nervous/anxious.     Objective:  BP 138/82 (BP Location: Left Arm, Patient Position: Sitting, Cuff Size: Large)   Pulse 84   Temp 97.9 F (36.6 C) (Oral)   Ht 5\' 8"  (1.727 m)   Wt 184 lb (83.5 kg)   SpO2 99%   BMI 27.98 kg/m   BP Readings from Last 3 Encounters:  04/02/17 138/82  11/14/16 (!) 146/85  09/29/16 138/86    Wt Readings from Last 3 Encounters:  04/02/17 184 lb (83.5 kg)  11/14/16 174 lb (78.9 kg)  09/29/16 175 lb (79.4 kg)    Physical Exam  Constitutional: He is oriented to person, place, and time. He appears well-developed. No distress.  NAD  HENT:  Mouth/Throat: Oropharynx is clear and moist.  Eyes: Conjunctivae are normal. Pupils are equal, round, and reactive to light.  Neck: Normal range of motion. No JVD present. No thyromegaly present.  Cardiovascular: Normal rate, regular rhythm, normal heart sounds and intact distal pulses. Exam reveals no gallop and  no friction rub.  No murmur heard. Pulmonary/Chest: Effort normal and breath sounds normal. No respiratory distress. He has no wheezes. He has no rales. He exhibits no tenderness.  Abdominal: Soft. Bowel sounds are normal. He exhibits no distension and no mass. There is no tenderness. There is no rebound and no guarding.  Musculoskeletal: Normal range of motion. He exhibits tenderness. He exhibits no edema.  Lymphadenopathy:    He has no cervical adenopathy.  Neurological: He is alert and oriented to person, place, and time. He  has normal reflexes. No cranial nerve deficit. He exhibits normal muscle tone. He displays a negative Romberg sign. Coordination and gait normal.  Skin: Skin is warm and dry. No rash noted.  Psychiatric: He has a normal mood and affect. His behavior is normal. Judgment and thought content normal.  R shoulder is a little tender  Lab Results  Component Value Date   WBC 6.0 09/17/2015   HGB 13.5 09/17/2015   HCT 40.5 09/17/2015   PLT 151.0 09/17/2015   GLUCOSE 136 (H) 03/28/2017   CHOL 122 09/17/2015   TRIG 150.0 (H) 09/17/2015   HDL 40.30 09/17/2015   LDLDIRECT 105.5 09/10/2013   LDLCALC 51 09/17/2015   ALT 13 09/17/2015   AST 15 09/17/2015   NA 134 (L) 03/28/2017   K 4.7 03/28/2017   CL 98 03/28/2017   CREATININE 1.62 (H) 03/28/2017   BUN 24 (H) 03/28/2017   CO2 26 03/28/2017   TSH 2.65 09/17/2015   PSA 0.81 09/17/2015   HGBA1C 7.3 (H) 03/28/2017   MICROALBUR 0.4 03/31/2010    Dg Shoulder Right  Result Date: 05/12/2016 CLINICAL DATA:  Right shoulder pain starting after lifting heavy object 2 days ago EXAM: RIGHT SHOULDER - 2+ VIEW COMPARISON:  None. FINDINGS: Three views of the right shoulder submitted. No acute fracture or subluxation. Mild degenerative narrowing of glenohumeral joint space. Moderate degenerative changes AC joint. There is spurring of distal clavicle. Small dystrophic soft tissue calcification is noted inferior to distal shaft of the clavicle measures about 7 mm. There is high riding humeral head suspicious for rotator cuff insufficiency. IMPRESSION: 1. No acute fracture or subluxation. Degenerative changes glenohumeral joint and AC joint. Spurring of distal clavicle. High riding humeral head suspicious for rotator cuff insufficiency Electronically Signed   By: Lahoma Crocker M.D.   On: 05/12/2016 14:04    Assessment & Plan:   There are no diagnoses linked to this encounter. I am having Otilio Connors maintain his aspirin, Glucosamine-Chondroitin, cholecalciferol,  HYDROCORTISONE ACE (RECTAL), loratadine, glucose blood, allopurinol, metFORMIN, omeprazole, amLODipine, trandolapril, rosuvastatin, and freestyle.  No orders of the defined types were placed in this encounter.    Follow-up: No Follow-up on file.  [name], MD

## 2017-04-02 NOTE — Assessment & Plan Note (Signed)
-   Crestor 

## 2017-04-02 NOTE — Patient Instructions (Addendum)
Wt Readings from Last 3 Encounters:  04/02/17 184 lb (83.5 kg)  11/14/16 174 lb (78.9 kg)  09/29/16 175 lb (79.4 kg)   Loose 10 lbs

## 2017-04-02 NOTE — Assessment & Plan Note (Signed)
On Allopurinol 

## 2017-04-02 NOTE — Assessment & Plan Note (Signed)
BP Readings from Last 3 Encounters:  04/02/17 138/82  11/14/16 (!) 146/85  09/29/16 138/86  ok at home

## 2017-04-02 NOTE — Assessment & Plan Note (Addendum)
Worse - loose 10 lbs CBGs ok at home RTC 3 mo

## 2017-04-09 ENCOUNTER — Other Ambulatory Visit: Payer: Self-pay | Admitting: Internal Medicine

## 2017-04-09 MED FILL — AMLODIPINE BESYLATE 5 MG TA: 5 | 90 days supply | Qty: 90 | Fill #1

## 2017-04-09 MED FILL — OMEPRAZOLE 20 MG CAP: 20 | 90 days supply | Qty: 90 | Fill #0

## 2017-04-09 MED FILL — metFORMIN HCL 500 MG TABS: 500 | 90 days supply | Qty: 180 | Fill #0

## 2017-05-28 ENCOUNTER — Other Ambulatory Visit: Payer: Self-pay | Admitting: Internal Medicine

## 2017-05-28 MED FILL — TRANDOLAPRIL 4 MG TABLET: 4 | 90 days supply | Qty: 90 | Fill #1

## 2017-05-30 MED FILL — ALLOPURINOL 300 MG TAB: 300 | 90 days supply | Qty: 90 | Fill #0

## 2017-06-18 ENCOUNTER — Encounter: Payer: Self-pay | Admitting: Family

## 2017-06-18 ENCOUNTER — Ambulatory Visit (INDEPENDENT_AMBULATORY_CARE_PROVIDER_SITE_OTHER): Payer: PPO | Admitting: Family

## 2017-06-18 VITALS — BP 154/84 | HR 102 | Temp 98.0°F | Ht 68.0 in | Wt 184.0 lb

## 2017-06-18 DIAGNOSIS — M545 Low back pain: Secondary | ICD-10-CM

## 2017-06-18 DIAGNOSIS — M62838 Other muscle spasm: Secondary | ICD-10-CM | POA: Diagnosis not present

## 2017-06-18 MED ORDER — HYDROCODONE-ACETAMINOPHEN 5-325 MG PO TABS: 1.0000 | ORAL_TABLET | Freq: Four times a day (QID) | ORAL | 0 refills | Status: DC | PRN

## 2017-06-18 MED ORDER — METHOCARBAMOL 500 MG PO TABS: 500.0000 mg | ORAL_TABLET | Freq: Three times a day (TID) | ORAL | 0 refills | Status: DC | PRN

## 2017-06-18 MED FILL — METHOCARBAMOL 500 MG TABLET: 500 | 10 days supply | Qty: 30 | Fill #0

## 2017-06-18 MED FILL — HYDROCODON-APAP 5-325: 5-325 | 5 days supply | Qty: 20 | Fill #0

## 2017-06-18 NOTE — Patient Instructions (Signed)

## 2017-06-18 NOTE — Progress Notes (Signed)
Douglas Richards is a 72 y.o. male with the following history as recorded in [employer]:  Patient Active Problem List   Diagnosis Date Noted  . Right shoulder pain 05/12/2016  . Cough 05/04/2015  . Abdominal pain 11/04/2014  . Abdominal bloating 11/04/2014  . Grief reaction 09/17/2013  . Neck pain, bilateral 04/24/2013  . Rash and nonspecific skin eruption 10/03/2012  . Well adult exam 05/21/2012  . BPH (benign prostatic hyperplasia) 05/21/2012  . Stress 02/21/2012  . Bronchitis, acute 01/13/2011  . DM2 (diabetes mellitus, type 2) ([place]) 07/23/2009  . SINUSITIS- ACUTE-NOS 03/30/2009  . OLECRANON BURSITIS, LEFT 11/27/2008  . TENDINITIS 04/01/2008  . ELBOW PAIN, LEFT 01/09/2008  . PAIN IN JOINT, ANKLE AND FOOT 01/09/2008  . HERPES ZOSTER 07/12/2007  . DIVERTICULOSIS, COLON 07/12/2007  . ANAL FISSURE, HX OF 07/12/2007  . TONSILLECTOMY, HX OF 07/12/2007  . HEMORRHOIDECTOMY, HX OF 07/12/2007  . OSTEOARTHRITIS 03/30/2007  . Dyslipidemia 03/29/2007  . Gout 03/29/2007  . Essential hypertension 03/29/2007  . ALLERGIC RHINITIS 03/29/2007  . ABNORMAL GLUCOSE NEC 03/29/2007  . COLONIC POLYPS, HX OF 03/29/2007    Current Outpatient Medications  Medication Sig Dispense Refill  . allopurinol (ZYLOPRIM) 300 MG tablet TAKE 1 TABLET BY MOUTH ONCE DAILY 90 tablet 3  . amLODipine (NORVASC) 5 MG tablet TAKE 1 TABLET BY MOUTH ONCE DAILY 90 tablet 3  . aspirin EC 81 MG tablet Take 1 tablet (81 mg total) by mouth daily. 100 tablet 3  . Cholecalciferol (VITAMIN D3) 1000 UNITS tablet Take 1,000 Units by mouth daily.      . Glucosamine-Chondroitin ([facility] GLUCOSAMINE-CHONDROITIN) 750-600 MG TABS Take by mouth 2 (two) times daily. Reported on 04/28/2015    . glucose blood (FREESTYLE LITE) test strip Use to check blood sugars twice a day 100 each 3  . HYDROcodone-acetaminophen (NORCO) 5-325 MG tablet Take 1 tablet by mouth every 6 (six) hours as needed for moderate pain. 20 tablet 0  . HYDROCORTISONE ACE,  RECTAL, (HEMRIL-30) 30 MG SUPP Place 1 suppository (30 mg total) rectally 2 (two) times daily. 60 each 1  . Lancets (FREESTYLE) lancets USE TO CHECK BLOOD SUGAR TWICE DAILY 100 each 3  . loratadine (CLARITIN) 10 MG tablet Take 10 mg by mouth daily.    . metFORMIN (GLUCOPHAGE) 500 MG tablet TAKE 1 TABLET BY MOUTH TWICE DAILY WITH MEALS 180 tablet 1  . methocarbamol (ROBAXIN) 500 MG tablet Take 1 tablet (500 mg total) by mouth every 8 (eight) hours as needed. 30 tablet 0  . omeprazole (PRILOSEC) 20 MG capsule TAKE 1 CAPSULE BY MOUTH ONCE DAILY 90 capsule 1  . rosuvastatin (CRESTOR) 10 MG tablet TAKE 1 TABLET BY MOUTH ONCE DAILY 90 tablet 3  . trandolapril (MAVIK) 4 MG tablet TAKE 1 TABLET BY MOUTH ONCE DAILY 90 tablet 3   No current facility-administered medications for this visit.     Allergies: Atorvastatin; Metformin; Prednisolone; Sulfonamide derivatives; and Trulicity [dulaglutide]  Past Medical History:  Diagnosis Date  . Allergic rhinitis   . Allergy   . Anal fissure   . Cataract    right eye   . Diabetes mellitus 2011   type II  . Diverticulosis of colon (without mention of hemorrhage)   . GERD (gastroesophageal reflux disease)   . Gout, unspecified   . Herpes zoster without mention of complication   . Hyperlipidemia   . Hypertension   . Osteoarthrosis, unspecified whether generalized or localized, unspecified site   . Other abnormal glucose   .  Personal history of colonic polyps     Past Surgical History:  Procedure Laterality Date  . CATARACT EXTRACTION Right   . COLONOSCOPY    . HEMORRHOID SURGERY    . LIPOMA EXCISION     from the neck  . POLYPECTOMY    . rotator cuff surgery Right   . TONSILLECTOMY      Family History  Problem Relation Age of Onset  . COPD Mother   . Stroke Mother   . Hypertension Mother   . Diabetes Father   . COPD Father   . Alcohol abuse Father   . Stroke Father   . Hypertension Father   . Alcohol abuse Brother   . Arthritis Brother         lupus  . Heart disease Brother   . Hypertension Unknown        fam hx  . Heart disease Sister   . Colon cancer Neg Hx     Social History   Tobacco Use  . Smoking status: Never Smoker  . Smokeless tobacco: Never Used  Substance Use Topics  . Alcohol use: Yes    Alcohol/week: 0.0 oz    Comment: 1 beer every 2-3 months or so .    Subjective:  Patient started with low back pain which started suddenly on Saturday morning while trying to move a concrete bird bath; having some radiating pain into right upper thigh; notes he felt back "lock up almost immediately." No changes with bowel or bladder habits; requesting prescription for muscle relaxer and pain medicine; cannot tolerate Prednisone;   Objective:  Vitals:   06/18/17 1024  BP: (!) 154/84  Pulse: (!) 102  Temp: 98 F (36.7 C)  TempSrc: Oral  SpO2: 97%  Weight: 184 lb (83.5 kg)  Height: 5\' 8"  (1.727 m)    General: Well developed, well nourished, in no acute distress  Skin : Warm and dry.  Head: Normocephalic and atraumatic  Lungs: Respirations unlabored; clear to auscultation bilaterally without wheeze, rales, rhonchi  Musculoskeletal: No deformities; no active joint inflammation; muscle spasms noted along lumbar spine bilaterally Extremities: No edema, cyanosis, clubbing  Vessels: Symmetric bilaterally  Neurologic: Alert and oriented; speech intact; face symmetrical; moves all extremities well; CNII-XII intact without focal deficit   Assessment:  1. Acute bilateral low back pain, with sciatica presence unspecified   2. Muscle spasm     Plan:  Rx for Robaxin and Norco; rest/ alternate heat and ice; follow-up worse, no better.   No follow-ups on file.  No orders of the defined types were placed in this encounter.   Requested Prescriptions   Signed Prescriptions Disp Refills  . methocarbamol (ROBAXIN) 500 MG tablet 30 tablet 0    Sig: Take 1 tablet (500 mg total) by mouth every 8 (eight) hours as needed.  Marland Kitchen  HYDROcodone-acetaminophen (NORCO) 5-325 MG tablet 20 tablet 0    Sig: Take 1 tablet by mouth every 6 (six) hours as needed for moderate pain.

## 2017-06-25 MED FILL — ROSUVASTATIN CALCIUM 10 MG: 10 | 90 days supply | Qty: 90 | Fill #1

## 2017-07-09 MED FILL — metFORMIN HCL 500 MG TABS: 500 | 90 days supply | Qty: 180 | Fill #1

## 2017-07-09 MED FILL — AMLODIPINE BESYLATE 5 MG TA: 5 | 90 days supply | Qty: 90 | Fill #2

## 2017-07-09 MED FILL — OMEPRAZOLE 20 MG CAP: 20 | 90 days supply | Qty: 90 | Fill #1

## 2017-07-31 ENCOUNTER — Ambulatory Visit: Payer: PPO | Admitting: Internal Medicine

## 2017-08-26 MED FILL — FREESTYLE LANCETS: 50 days supply | Qty: 100 | Fill #1

## 2017-08-27 ENCOUNTER — Other Ambulatory Visit: Payer: Self-pay | Admitting: Internal Medicine

## 2017-08-27 MED FILL — TRANDOLAPRIL 4 MG TABLET: 4 | 90 days supply | Qty: 90 | Fill #2

## 2017-08-27 MED FILL — ALLOPURINOL 300 MG TABS: 300 | 90 days supply | Qty: 90 | Fill #1

## 2017-08-27 MED FILL — FREESTYLE LITE TEST STRIP: 50 days supply | Qty: 100 | Fill #0

## 2017-09-04 ENCOUNTER — Other Ambulatory Visit (INDEPENDENT_AMBULATORY_CARE_PROVIDER_SITE_OTHER): Payer: PPO

## 2017-09-04 DIAGNOSIS — E118 Type 2 diabetes mellitus with unspecified complications: Secondary | ICD-10-CM

## 2017-09-04 LAB — BASIC METABOLIC PANEL
BUN: 19 mg/dL (ref 6–23)
CHLORIDE: 98 meq/L (ref 96–112)
CO2: 25 meq/L (ref 19–32)
Calcium: 9.8 mg/dL (ref 8.4–10.5)
Creatinine, Ser: 1.63 mg/dL — ABNORMAL HIGH (ref 0.40–1.50)
GFR: 44.43 mL/min — ABNORMAL LOW (ref >60.00)
Glucose, Bld: 115 mg/dL — ABNORMAL HIGH (ref 70–99)
POTASSIUM: 5 meq/L (ref 3.5–5.1)
Sodium: 132 mEq/L — ABNORMAL LOW (ref 135–145)

## 2017-09-04 LAB — HEMOGLOBIN A1C: HEMOGLOBIN A1C: 7.5 % — AB (ref 4.6–6.5)

## 2017-09-10 ENCOUNTER — Ambulatory Visit (INDEPENDENT_AMBULATORY_CARE_PROVIDER_SITE_OTHER): Payer: PPO | Admitting: Internal Medicine

## 2017-09-10 ENCOUNTER — Encounter: Payer: Self-pay | Admitting: Internal Medicine

## 2017-09-10 DIAGNOSIS — I10 Essential (primary) hypertension: Secondary | ICD-10-CM | POA: Diagnosis not present

## 2017-09-10 DIAGNOSIS — N400 Enlarged prostate without lower urinary tract symptoms: Secondary | ICD-10-CM

## 2017-09-10 DIAGNOSIS — N182 Chronic kidney disease, stage 2 (mild): Secondary | ICD-10-CM | POA: Diagnosis not present

## 2017-09-10 DIAGNOSIS — M1000 Idiopathic gout, unspecified site: Secondary | ICD-10-CM | POA: Diagnosis not present

## 2017-09-10 DIAGNOSIS — N183 Chronic kidney disease, stage 3 unspecified: Secondary | ICD-10-CM

## 2017-09-10 DIAGNOSIS — E1122 Type 2 diabetes mellitus with diabetic chronic kidney disease: Secondary | ICD-10-CM

## 2017-09-10 MED ORDER — REPAGLINIDE 1 MG PO TABS: 1.0000 mg | ORAL_TABLET | Freq: Three times a day (TID) | ORAL | 11 refills | Status: DC

## 2017-09-10 MED FILL — REPAGLINIDE 1 MG TABS: 1 | 90 days supply | Qty: 270 | Fill #0

## 2017-09-10 NOTE — Assessment & Plan Note (Signed)
Renal US 

## 2017-09-10 NOTE — Progress Notes (Signed)
Subjective:  Patient ID: Douglas Richards, male    DOB: 10/29/1945  Age: 72 y.o. MRN: [id number]  CC: No chief complaint on file.   HPI Douglas Richards presents for HTN, DM, gout f/u F/u LBP - resolving  Outpatient Medications Prior to Visit  Medication Sig Dispense Refill  . allopurinol (ZYLOPRIM) 300 MG tablet TAKE 1 TABLET BY MOUTH ONCE DAILY 90 tablet 3  . amLODipine (NORVASC) 5 MG tablet TAKE 1 TABLET BY MOUTH ONCE DAILY 90 tablet 3  . aspirin EC 81 MG tablet Take 1 tablet (81 mg total) by mouth daily. 100 tablet 3  . Cholecalciferol (VITAMIN D3) 1000 UNITS tablet Take 1,000 Units by mouth daily.      . Glucosamine-Chondroitin ([facility] GLUCOSAMINE-CHONDROITIN) 750-600 MG TABS Take by mouth 2 (two) times daily. Reported on 04/28/2015    . glucose blood (FREESTYLE LITE) test strip USE TO CHECK BLOOD SUGARS TWICE A DAY 60 each 5  . HYDROcodone-acetaminophen (NORCO) 5-325 MG tablet Take 1 tablet by mouth every 6 (six) hours as needed for moderate pain. 20 tablet 0  . HYDROCORTISONE ACE, RECTAL, (HEMRIL-30) 30 MG SUPP Place 1 suppository (30 mg total) rectally 2 (two) times daily. 60 each 1  . Lancets (FREESTYLE) lancets USE TO CHECK BLOOD SUGAR TWICE DAILY 100 each 3  . loratadine (CLARITIN) 10 MG tablet Take 10 mg by mouth daily.    . metFORMIN (GLUCOPHAGE) 500 MG tablet TAKE 1 TABLET BY MOUTH TWICE DAILY WITH MEALS 180 tablet 1  . methocarbamol (ROBAXIN) 500 MG tablet Take 1 tablet (500 mg total) by mouth every 8 (eight) hours as needed. 30 tablet 0  . omeprazole (PRILOSEC) 20 MG capsule TAKE 1 CAPSULE BY MOUTH ONCE DAILY 90 capsule 1  . rosuvastatin (CRESTOR) 10 MG tablet TAKE 1 TABLET BY MOUTH ONCE DAILY 90 tablet 3  . trandolapril (MAVIK) 4 MG tablet TAKE 1 TABLET BY MOUTH ONCE DAILY 90 tablet 3   No facility-administered medications prior to visit.     ROS: Review of Systems  Constitutional: Negative for appetite change, fatigue and unexpected weight change.  HENT: Negative for  congestion, nosebleeds, sneezing, sore throat and trouble swallowing.   Eyes: Negative for itching and visual disturbance.  Respiratory: Negative for cough.   Cardiovascular: Negative for chest pain, palpitations and leg swelling.  Gastrointestinal: Negative for abdominal distention, blood in stool, diarrhea and nausea.  Genitourinary: Negative for frequency and hematuria.  Musculoskeletal: Positive for arthralgias. Negative for back pain, gait problem, joint swelling and neck pain.  Skin: Negative for rash.  Neurological: Negative for dizziness, tremors, speech difficulty and weakness.  Psychiatric/Behavioral: Negative for agitation, dysphoric mood, sleep disturbance and suicidal ideas. The patient is not nervous/anxious.     Objective:  BP 134/78 (BP Location: Right Arm, Patient Position: Sitting, Cuff Size: Normal)   Pulse 73   Temp 98.2 F (36.8 C) (Oral)   Ht 5\' 8"  (1.727 m)   Wt 179 lb (81.2 kg)   SpO2 97%   BMI 27.22 kg/m   BP Readings from Last 3 Encounters:  09/10/17 134/78  06/18/17 (!) 154/84  04/02/17 138/82    Wt Readings from Last 3 Encounters:  09/10/17 179 lb (81.2 kg)  06/18/17 184 lb (83.5 kg)  04/02/17 184 lb (83.5 kg)    Physical Exam  Constitutional: He is oriented to person, place, and time. He appears well-developed. No distress.  NAD  HENT:  Mouth/Throat: Oropharynx is clear and moist.  Eyes: Pupils are  equal, round, and reactive to light. Conjunctivae are normal.  Neck: Normal range of motion. No JVD present. No thyromegaly present.  Cardiovascular: Normal rate, regular rhythm, normal heart sounds and intact distal pulses. Exam reveals no gallop and no friction rub.  No murmur heard. Pulmonary/Chest: Effort normal and breath sounds normal. No respiratory distress. He has no wheezes. He has no rales. He exhibits no tenderness.  Abdominal: Soft. Bowel sounds are normal. He exhibits no distension and no mass. There is no tenderness. There is no  rebound and no guarding.  Musculoskeletal: Normal range of motion. He exhibits no edema or tenderness.  Lymphadenopathy:    He has no cervical adenopathy.  Neurological: He is alert and oriented to person, place, and time. He has normal reflexes. No cranial nerve deficit. He exhibits normal muscle tone. He displays a negative Romberg sign. Coordination and gait normal.  Skin: Skin is warm and dry. No rash noted.  Psychiatric: He has a normal mood and affect. His behavior is normal. Judgment and thought content normal.    Lab Results  Component Value Date   WBC 6.0 09/17/2015   HGB 13.5 09/17/2015   HCT 40.5 09/17/2015   PLT 151.0 09/17/2015   GLUCOSE 115 (H) 09/04/2017   CHOL 122 09/17/2015   TRIG 150.0 (H) 09/17/2015   HDL 40.30 09/17/2015   LDLDIRECT 105.5 09/10/2013   LDLCALC 51 09/17/2015   ALT 13 09/17/2015   AST 15 09/17/2015   NA 132 (L) 09/04/2017   K 5.0 09/04/2017   CL 98 09/04/2017   CREATININE 1.63 (H) 09/04/2017   BUN 19 09/04/2017   CO2 25 09/04/2017   TSH 2.65 09/17/2015   PSA 0.81 09/17/2015   HGBA1C 7.5 (H) 09/04/2017   MICROALBUR 0.4 03/31/2010    Dg Shoulder Right  Result Date: 05/12/2016 CLINICAL DATA:  Right shoulder pain starting after lifting heavy object 2 days ago EXAM: RIGHT SHOULDER - 2+ VIEW COMPARISON:  None. FINDINGS: Three views of the right shoulder submitted. No acute fracture or subluxation. Mild degenerative narrowing of glenohumeral joint space. Moderate degenerative changes AC joint. There is spurring of distal clavicle. Small dystrophic soft tissue calcification is noted inferior to distal shaft of the clavicle measures about 7 mm. There is high riding humeral head suspicious for rotator cuff insufficiency. IMPRESSION: 1. No acute fracture or subluxation. Degenerative changes glenohumeral joint and AC joint. Spurring of distal clavicle. High riding humeral head suspicious for rotator cuff insufficiency Electronically Signed   By: Lahoma Crocker  M.D.   On: 05/12/2016 14:04    Assessment & Plan:   There are no diagnoses linked to this encounter.   No orders of the defined types were placed in this encounter.    Follow-up: No follow-ups on file.  [name], MD

## 2017-09-10 NOTE — Assessment & Plan Note (Signed)
Renal US Add Prandin

## 2017-09-10 NOTE — Addendum Note (Signed)
Addended by: Karren Cobble on: 09/10/2017 04:33 PM   Modules accepted: Orders

## 2017-09-10 NOTE — Assessment & Plan Note (Signed)
Amlodipine.

## 2017-09-10 NOTE — Assessment & Plan Note (Signed)
No relapse 

## 2017-09-18 DIAGNOSIS — H43811 Vitreous degeneration, right eye: Secondary | ICD-10-CM | POA: Diagnosis not present

## 2017-09-18 DIAGNOSIS — Z961 Presence of intraocular lens: Secondary | ICD-10-CM | POA: Diagnosis not present

## 2017-09-18 DIAGNOSIS — H4911 Fourth [trochlear] nerve palsy, right eye: Secondary | ICD-10-CM | POA: Diagnosis not present

## 2017-09-18 DIAGNOSIS — H35373 Puckering of macula, bilateral: Secondary | ICD-10-CM | POA: Diagnosis not present

## 2017-09-18 LAB — HM DIABETES EYE EXAM

## 2017-09-18 NOTE — Addendum Note (Signed)
Addended by: Karren Cobble on: 09/18/2017 04:32 PM   Modules accepted: Orders

## 2017-09-23 MED FILL — ROSUVASTATIN CALCIUM 10 MG: 10 | 90 days supply | Qty: 90 | Fill #2

## 2017-10-03 ENCOUNTER — Ambulatory Visit
Admission: RE | Admit: 2017-10-03 | Discharge: 2017-10-03 | Disposition: A | Discharge status: Home / Self Care | Payer: PPO | Source: Ambulatory Visit | Attending: Internal Medicine | Admitting: Internal Medicine

## 2017-10-03 DIAGNOSIS — I10 Essential (primary) hypertension: Secondary | ICD-10-CM

## 2017-10-03 DIAGNOSIS — E1122 Type 2 diabetes mellitus with diabetic chronic kidney disease: Secondary | ICD-10-CM

## 2017-10-03 DIAGNOSIS — N289 Disorder of kidney and ureter, unspecified: Secondary | ICD-10-CM | POA: Diagnosis not present

## 2017-10-03 DIAGNOSIS — N400 Enlarged prostate without lower urinary tract symptoms: Secondary | ICD-10-CM

## 2017-10-03 DIAGNOSIS — N182 Chronic kidney disease, stage 2 (mild): Secondary | ICD-10-CM

## 2017-10-09 ENCOUNTER — Other Ambulatory Visit: Payer: Self-pay | Admitting: Internal Medicine

## 2017-10-09 MED FILL — metFORMIN HCL 500 MG TABS: 500 | 90 days supply | Qty: 180 | Fill #0

## 2017-10-09 MED FILL — OMEPRAZOLE 20 MG CPDR: 20 | 90 days supply | Qty: 90 | Fill #0

## 2017-10-09 MED FILL — AMLODIPINE BESYLATE 5 MG TA: 5 | 90 days supply | Qty: 90 | Fill #3

## 2017-11-25 MED FILL — TRANDOLAPRIL 4 MG TABLET: 4 | 90 days supply | Qty: 90 | Fill #3

## 2017-11-26 MED FILL — ALLOPURINOL 300 MG TAB: 300 | 90 days supply | Qty: 90 | Fill #2

## 2017-11-28 NOTE — Progress Notes (Addendum)
Subjective:   Douglas Richards is a 72 y.o. male who presents for Medicare Annual/Subsequent preventive examination.  Review of Systems:  No ROS.  Medicare Wellness Visit. Additional risk factors are reflected in the social history.  Cardiac Risk Factors include: advanced age (>66men, >96 women);diabetes mellitus;dyslipidemia;hypertension;male gender Sleep patterns: feels rested on waking, gets up 1 times nightly to void and sleeps 7-8 hours nightly.    Home Safety/Smoke Alarms: Feels safe in home. Smoke alarms in place.  Living environment; residence and Firearm Safety: 2-story house, no firearms. Lives with wife, no needs for DME, good support system Seat Belt Safety/Bike Helmet: Wears seat belt.     Objective:    Vitals: BP (!) 158/94   Pulse 82   Resp 17   Ht 5\' 8"  (1.727 m)   Wt 180 lb (81.6 kg)   SpO2 98%   BMI 27.37 kg/m   Body mass index is 27.37 kg/m.  Advanced Directives 11/29/2017 11/14/2016 04/28/2015 12/25/2013  Does Patient Have a Medical Advance Directive? Yes Yes Yes Yes  Type of Paramedic of [place];Living will [place];Living will - [place] in Chart? No - copy requested No - copy requested Yes -    Tobacco Social History   Tobacco Use  Smoking Status Never Smoker  Smokeless Tobacco Never Used     Counseling given: Not Answered  Past Medical History:  Diagnosis Date  . Allergic rhinitis   . Allergy   . Anal fissure   . Cataract    right eye   . Diabetes mellitus 2011   type II  . Diverticulosis of colon (without mention of hemorrhage)   . GERD (gastroesophageal reflux disease)   . Gout, unspecified   . Herpes zoster without mention of complication   . Hyperlipidemia   . Hypertension   . Osteoarthrosis, unspecified whether generalized or localized, unspecified site   . Other abnormal glucose   . Personal history of colonic polyps    Past  Surgical History:  Procedure Laterality Date  . CATARACT EXTRACTION Right   . COLONOSCOPY    . HEMORRHOID SURGERY    . LIPOMA EXCISION     from the neck  . POLYPECTOMY    . rotator cuff surgery Right   . TONSILLECTOMY     Family History  Problem Relation Age of Onset  . COPD Mother   . Stroke Mother   . Hypertension Mother   . Diabetes Father   . COPD Father   . Alcohol abuse Father   . Stroke Father   . Hypertension Father   . Alcohol abuse Brother   . Arthritis Brother        lupus  . Heart disease Brother   . Hypertension Unknown        fam hx  . Heart disease Sister   . Colon cancer Neg Hx    Social History   Socioeconomic History  . Marital status: Married    Spouse name: Not on file  . Number of children: Not on file  . Years of education: Not on file  . Highest education level: Not on file  Occupational History  . Not on file  Social Needs  . Financial resource strain: Not hard at all  . Food insecurity:    Worry: Never true    Inability: Never true  . Transportation needs:    Medical: No  Non-medical: No  Tobacco Use  . Smoking status: Never Smoker  . Smokeless tobacco: Never Used  Substance and Sexual Activity  . Alcohol use: Not Currently    Alcohol/week: 0.0 standard drinks    Comment: 1 beer every 2-3 months or so .  Marland Kitchen Drug use: No  . Sexual activity: Yes  Lifestyle  . Physical activity:    Days per week: 4 days    Minutes per session: 40 min  . Stress: Not at all  Relationships  . Social connections:    Talks on phone: More than three times a week    Gets together: More than three times a week    Attends religious service: More than 4 times per year    Active member of club or organization: Yes    Attends meetings of clubs or organizations: More than 4 times per year    Relationship status: Married  Other Topics Concern  . Not on file  Social History Narrative   Regular exercise- no.    Outpatient Encounter Medications as of  11/29/2017  Medication Sig  . allopurinol (ZYLOPRIM) 300 MG tablet TAKE 1 TABLET BY MOUTH ONCE DAILY  . amLODipine (NORVASC) 5 MG tablet TAKE 1 TABLET BY MOUTH ONCE DAILY  . aspirin EC 81 MG tablet Take 1 tablet (81 mg total) by mouth daily. (Patient taking differently: Take 325 mg by mouth every 6 (six) hours as needed. )  . Cholecalciferol (VITAMIN D3) 1000 UNITS tablet Take 1,000 Units by mouth daily.    . Glucosamine-Chondroitin ([facility] GLUCOSAMINE-CHONDROITIN) 750-600 MG TABS Take by mouth 2 (two) times daily. Reported on 04/28/2015  . glucose blood (FREESTYLE LITE) test strip USE TO CHECK BLOOD SUGARS TWICE A DAY  . HYDROCORTISONE ACE, RECTAL, (HEMRIL-30) 30 MG SUPP Place 1 suppository (30 mg total) rectally 2 (two) times daily.  . Lancets (FREESTYLE) lancets USE TO CHECK BLOOD SUGAR TWICE DAILY  . loratadine (CLARITIN) 10 MG tablet Take 10 mg by mouth daily.  . metFORMIN (GLUCOPHAGE) 500 MG tablet TAKE 1 TABLET BY MOUTH TWICE DAILY WITH MEALS  . omeprazole (PRILOSEC) 20 MG capsule TAKE 1 CAPSULE BY MOUTH ONCE DAILY  . repaglinide (PRANDIN) 1 MG tablet Take 1 tablet (1 mg total) by mouth 3 (three) times daily before meals.  . rosuvastatin (CRESTOR) 10 MG tablet TAKE 1 TABLET BY MOUTH ONCE DAILY  . trandolapril (MAVIK) 4 MG tablet TAKE 1 TABLET BY MOUTH ONCE DAILY  . [DISCONTINUED] methocarbamol (ROBAXIN) 500 MG tablet Take 1 tablet (500 mg total) by mouth every 8 (eight) hours as needed. (Patient not taking: Reported on 11/29/2017)   No facility-administered encounter medications on file as of 11/29/2017.     Activities of Daily Living In your present state of health, do you have any difficulty performing the following activities: 11/29/2017  Hearing? N  Vision? N  Difficulty concentrating or making decisions? N  Walking or climbing stairs? N  Dressing or bathing? N  Doing errands, shopping? N  Preparing Food and eating ? N  Using the Toilet? N  In the past six months, have you  accidently leaked urine? N  Do you have problems with loss of bowel control? N  Managing your Medications? N  Managing your Finances? N  Housekeeping or managing your Housekeeping? N  Some recent data might be hidden    Patient Care Team: Plotnikov, Evie Lacks, MD as PCP - General   Assessment:   This is a routine wellness examination for Armando.  Physical assessment deferred to PCP.   Exercise Activities and Dietary recommendations Current Exercise Habits: Home exercise routine, Type of exercise: walking;stretching, Time (Minutes): 40, Frequency (Times/Week): 5, Weekly Exercise (Minutes/Week): 200, Intensity: Mild, Exercise limited by: orthopedic condition(s)  Diet (meal preparation, eat out, water intake, caffeinated beverages, dairy products, fruits and vegetables): in general, a "healthy" diet  , well balanced . eats a variety of fruits and vegetables daily, limits salt, fat/cholesterol, sugar,carbohydrates,caffeine.   Reviewed heart healthy and diabetic diet. Encouraged patient to increase daily water and healthy fluid intake.  Goals      Patient Stated   . patient (pt-stated)     Wants to come off of his medications!!!       Other   . Continue to improve the mobility of  my shoulder, increase my exercise, monitor diet closer, and continue to see Dr. Harlen Labs    . Patient Stated     I want to increase my physical activity by me making up my mind to be more active.       Fall Risk Fall Risk  11/29/2017 11/14/2016 09/29/2016 04/28/2015 06/30/2014  Falls in the past year? Yes No No No No  Number falls in past yr: 1 - - - -  Injury with Fall? No - - - -  Follow up Falls prevention discussed - - - -    Depression Screen PHQ 2/9 Scores 11/29/2017 11/14/2016 09/29/2016 04/28/2015  PHQ - 2 Score 0 0 0 0  PHQ- 9 Score - 0 - -    Cognitive Function MMSE - Mini Mental State Exam 11/14/2016 04/28/2015  Not completed: - (No Data)  Orientation to time 5 -  Orientation to Place 5 -    Registration 3 -  Attention/ Calculation 5 -  Recall 3 -  Language- name 2 objects 2 -  Language- repeat 1 -  Language- follow 3 step command 3 -  Language- read & follow direction 1 -  Write a sentence 1 -  Copy design 1 -  Total score 30 -       Ad8 score reviewed for issues:  Issues making decisions: no  Less interest in hobbies / activities: no  Repeats questions, stories (family complaining): no  Trouble using ordinary gadgets (microwave, computer, phone):no  Forgets the month or year: no  Mismanaging finances: no  Remembering appts: no  Daily problems with thinking and/or memory: no Ad8 score is= 0  Immunization History  Administered Date(s) Administered  . Influenza Whole 11/28/2005, 11/27/2008  . Influenza, High Dose Seasonal PF 11/09/2015, 09/29/2016, 11/29/2017  . Influenza,inj,Quad PF,6+ Mos 10/28/2013, 10/21/2014  . Influenza-Unspecified 01/06/2013  . Pneumococcal Conjugate-13 01/13/2014  . Pneumococcal Polysaccharide-23 06/07/2011  . Td 07/23/2009   Screening Tests Health Maintenance  Topic Date Due  . OPHTHALMOLOGY EXAM  03/25/2015  . FOOT EXAM  09/23/2016  . INFLUENZA VACCINE  09/06/2017  . HEMOGLOBIN A1C  03/07/2018  . COLONOSCOPY  01/16/2019  . TETANUS/TDAP  07/24/2019  . Hepatitis C Screening  Completed  . PNA vac Low Risk Adult  Completed      Plan:     Continue doing brain stimulating activities (puzzles, reading, adult coloring books, staying active) to keep memory sharp.   Continue to eat heart healthy diet (full of fruits, vegetables, whole grains, lean protein, water--limit salt, fat, and sugar intake) and increase physical activity as tolerated.  I have personally reviewed and noted the following in the patient's chart:   . Medical and social  history . Use of alcohol, tobacco or illicit drugs  . Current medications and supplements . Functional ability and status . Nutritional status . Physical activity . Advanced  directives . List of other physicians . Vitals . Screenings to include cognitive, depression, and falls . Referrals and appointments  In addition, I have reviewed and discussed with patient certain preventive protocols, quality metrics, and best practice recommendations. A written personalized care plan for preventive services as well as general preventive health recommendations were provided to patient.     Michiel Cowboy, RN  11/29/2017  Medical screening examination/treatment/procedure(s) were performed by non-physician practitioner and as supervising physician I was immediately available for consultation/collaboration. I agree with above. Lew Dawes, MD

## 2017-11-29 ENCOUNTER — Ambulatory Visit (INDEPENDENT_AMBULATORY_CARE_PROVIDER_SITE_OTHER): Payer: PPO | Admitting: *Deleted

## 2017-11-29 VITALS — BP 158/94 | HR 82 | Resp 17 | Ht 68.0 in | Wt 180.0 lb

## 2017-11-29 DIAGNOSIS — E1122 Type 2 diabetes mellitus with diabetic chronic kidney disease: Secondary | ICD-10-CM | POA: Diagnosis not present

## 2017-11-29 DIAGNOSIS — Z0000 Encounter for general adult medical examination without abnormal findings: Secondary | ICD-10-CM

## 2017-11-29 DIAGNOSIS — Z23 Encounter for immunization: Secondary | ICD-10-CM

## 2017-11-29 DIAGNOSIS — N182 Chronic kidney disease, stage 2 (mild): Secondary | ICD-10-CM

## 2017-11-29 NOTE — Patient Instructions (Addendum)
Continue doing brain stimulating activities (puzzles, reading, adult coloring books, staying active) to keep memory sharp.   Continue to eat heart healthy diet (full of fruits, vegetables, whole grains, lean protein, water--limit salt, fat, and sugar intake) and increase physical activity as tolerated.  Douglas Richards , Thank you for taking time to come for your Medicare Wellness Visit. I appreciate your ongoing commitment to your health goals. Please review the following plan we discussed and let me know if I can assist you in the future.   These are the goals we discussed: Goals      Patient Stated   . patient (pt-stated)     Wants to come off of his medications!!!       Other   . Continue to improve the mobility of  my shoulder, increase my exercise, monitor diet closer, and continue to see Dr. Harlen Labs    . Patient Stated     I want to increase my physical activity by me making up my mind to be more active.       This is a list of the screening recommended for you and due dates:  Health Maintenance  Topic Date Due  . Eye exam for diabetics  03/25/2015  . Complete foot exam   09/23/2016  . Flu Shot  09/06/2017  . Hemoglobin A1C  03/07/2018  . Colon Cancer Screening  01/16/2019  . Tetanus Vaccine  07/24/2019  .  Hepatitis C: One time screening is recommended by Center for Disease Control  (CDC) for  adults born from [birth year] through 1965.   Completed  . Pneumonia vaccines  Completed     Influenza Virus Vaccine injection What is this medicine? INFLUENZA VIRUS VACCINE (in floo EN zuh VAHY ruhs vak SEEN) helps to reduce the risk of getting influenza also known as the flu. The vaccine only helps protect you against some strains of the flu. This medicine may be used for other purposes; ask your health care provider or pharmacist if you have questions. COMMON BRAND NAME(S): Afluria, Agriflu, Alfuria, FLUAD, Fluarix, Fluarix Quadrivalent, Flublok, Flublok Quadrivalent, FLUCELVAX, Flulaval,  Fluvirin, Fluzone, Fluzone High-Dose, Fluzone Intradermal What should I tell my health care provider before I take this medicine? They need to know if you have any of these conditions: -bleeding disorder like hemophilia -fever or infection -Guillain-Barre syndrome or other neurological problems -immune system problems -infection with the human immunodeficiency virus (HIV) or AIDS -low blood platelet counts -multiple sclerosis -an unusual or allergic reaction to influenza virus vaccine, latex, other medicines, foods, dyes, or preservatives. Different brands of vaccines contain different allergens. Some may contain latex or eggs. Talk to your doctor about your allergies to make sure that you get the right vaccine. -pregnant or trying to get pregnant -breast-feeding How should I use this medicine? This vaccine is for injection into a muscle or under the skin. It is given by a health care professional. A copy of Vaccine Information Statements will be given before each vaccination. Read this sheet carefully each time. The sheet may change frequently. Talk to your healthcare provider to see which vaccines are right for you. Some vaccines should not be used in all age groups. Overdosage: If you think you have taken too much of this medicine contact a poison control center or emergency room at once. NOTE: This medicine is only for you. Do not share this medicine with others. What if I miss a dose? This does not apply. What may interact with this medicine? -chemotherapy  or radiation therapy -medicines that lower your immune system like etanercept, anakinra, infliximab, and adalimumab -medicines that treat or prevent blood clots like warfarin -phenytoin -steroid medicines like prednisone or cortisone -theophylline -vaccines This list may not describe all possible interactions. Give your health care provider a list of all the medicines, herbs, non-prescription drugs, or dietary supplements you use.  Also tell them if you smoke, drink alcohol, or use illegal drugs. Some items may interact with your medicine. What should I watch for while using this medicine? Report any side effects that do not go away within 3 days to your doctor or health care professional. Call your health care provider if any unusual symptoms occur within 6 weeks of receiving this vaccine. You may still catch the flu, but the illness is not usually as bad. You cannot get the flu from the vaccine. The vaccine will not protect against colds or other illnesses that may cause fever. The vaccine is needed every year. What side effects may I notice from receiving this medicine? Side effects that you should report to your doctor or health care professional as soon as possible: -allergic reactions like skin rash, itching or hives, swelling of the face, lips, or tongue Side effects that usually do not require medical attention (report to your doctor or health care professional if they continue or are bothersome): -fever -headache -muscle aches and pains -pain, tenderness, redness, or swelling at the injection site -tiredness This list may not describe all possible side effects. Call your doctor for medical advice about side effects. You may report side effects to FDA at 1-800-FDA-1088. Where should I keep my medicine? The vaccine will be given by a health care professional in a clinic, pharmacy, doctor's office, or other health care setting. You will not be given vaccine doses to store at home. NOTE: This sheet is a summary. It may not cover all possible information. If you have questions about this medicine, talk to your doctor, pharmacist, or health care provider.  2018 Elsevier/Gold Standard (2014-08-14 10:07:28)   Health Maintenance, Male A healthy lifestyle and preventive care is important for your health and wellness. Ask your health care provider about what schedule of regular examinations is right for you. What should I  know about weight and diet? Eat a Healthy Diet  Eat plenty of vegetables, fruits, whole grains, low-fat dairy products, and lean protein.  Do not eat a lot of foods high in solid fats, added sugars, or salt.  Maintain a Healthy Weight Regular exercise can help you achieve or maintain a healthy weight. You should:  Do at least 150 minutes of exercise each week. The exercise should increase your heart rate and make you sweat (moderate-intensity exercise).  Do strength-training exercises at least twice a week.  Watch Your Levels of Cholesterol and Blood Lipids  Have your blood tested for lipids and cholesterol every 5 years starting at 72 years of age. If you are at high risk for heart disease, you should start having your blood tested when you are 72 years old. You may need to have your cholesterol levels checked more often if: ? Your lipid or cholesterol levels are high. ? You are older than 72 years of age. ? You are at high risk for heart disease.  What should I know about cancer screening? Many types of cancers can be detected early and may often be prevented. Lung Cancer  You should be screened every year for lung cancer if: ? You are a current  smoker who has smoked for at least 30 years. ? You are a former smoker who has quit within the past 15 years.  Talk to your health care provider about your screening options, when you should start screening, and how often you should be screened.  Colorectal Cancer  Routine colorectal cancer screening usually begins at 72 years of age and should be repeated every 5-10 years until you are 72 years old. You may need to be screened more often if early forms of precancerous polyps or small growths are found. Your health care provider may recommend screening at an earlier age if you have risk factors for colon cancer.  Your health care provider may recommend using home test kits to check for hidden blood in the stool.  A small camera at the  end of a tube can be used to examine your colon (sigmoidoscopy or colonoscopy). This checks for the earliest forms of colorectal cancer.  Prostate and Testicular Cancer  Depending on your age and overall health, your health care provider may do certain tests to screen for prostate and testicular cancer.  Talk to your health care provider about any symptoms or concerns you have about testicular or prostate cancer.  Skin Cancer  Check your skin from head to toe regularly.  Tell your health care provider about any new moles or changes in moles, especially if: ? There is a change in a mole's size, shape, or color. ? You have a mole that is larger than a pencil eraser.  Always use sunscreen. Apply sunscreen liberally and repeat throughout the day.  Protect yourself by wearing long sleeves, pants, a wide-brimmed hat, and sunglasses when outside.  What should I know about heart disease, diabetes, and high blood pressure?  If you are 58-107 years of age, have your blood pressure checked every 3-5 years. If you are 60 years of age or older, have your blood pressure checked every year. You should have your blood pressure measured twice-once when you are at a hospital or clinic, and once when you are not at a hospital or clinic. Record the average of the two measurements. To check your blood pressure when you are not at a hospital or clinic, you can use: ? An automated blood pressure machine at a pharmacy. ? A home blood pressure monitor.  Talk to your health care provider about your target blood pressure.  If you are between 47-43 years old, ask your health care provider if you should take aspirin to prevent heart disease.  Have regular diabetes screenings by checking your fasting blood sugar level. ? If you are at a normal weight and have a low risk for diabetes, have this test once every three years after the age of 8. ? If you are overweight and have a high risk for diabetes, consider being  tested at a younger age or more often.  A one-time screening for abdominal aortic aneurysm (AAA) by ultrasound is recommended for men aged 6-75 years who are current or former smokers. What should I know about preventing infection? Hepatitis B If you have a higher risk for hepatitis B, you should be screened for this virus. Talk with your health care provider to find out if you are at risk for hepatitis B infection. Hepatitis C Blood testing is recommended for:  Everyone born from [birth year] through 1965.  Anyone with known risk factors for hepatitis C.  Sexually Transmitted Diseases (STDs)  You should be screened each year for STDs including gonorrhea  and chlamydia if: ? You are sexually active and are younger than 72 years of age. ? You are older than 72 years of age and your health care provider tells you that you are at risk for this type of infection. ? Your sexual activity has changed since you were last screened and you are at an increased risk for chlamydia or gonorrhea. Ask your health care provider if you are at risk.  Talk with your health care provider about whether you are at high risk of being infected with HIV. Your health care provider may recommend a prescription medicine to help prevent HIV infection.  What else can I do?  Schedule regular health, dental, and eye exams.  Stay current with your vaccines (immunizations).  Do not use any tobacco products, such as cigarettes, chewing tobacco, and e-cigarettes. If you need help quitting, ask your health care provider.  Limit alcohol intake to no more than 2 drinks per day. One drink equals 12 ounces of beer, 5 ounces of wine, or 1 ounces of hard liquor.  Do not use street drugs.  Do not share needles.  Ask your health care provider for help if you need support or information about quitting drugs.  Tell your health care provider if you often feel depressed.  Tell your health care provider if you have ever been abused  or do not feel safe at home. This information is not intended to replace advice given to you by your health care provider. Make sure you discuss any questions you have with your health care provider. Document Released: 07/22/2007 Document Revised: 09/22/2015 Document Reviewed: 10/27/2014 Elsevier Interactive Patient Education  [employer].

## 2017-12-14 ENCOUNTER — Ambulatory Visit (INDEPENDENT_AMBULATORY_CARE_PROVIDER_SITE_OTHER)
Admission: RE | Admit: 2017-12-14 | Discharge: 2017-12-14 | Disposition: A | Discharge status: Home / Self Care | Payer: PPO | Source: Ambulatory Visit | Attending: Family | Admitting: Family

## 2017-12-14 ENCOUNTER — Ambulatory Visit: Payer: Self-pay | Admitting: *Deleted

## 2017-12-14 ENCOUNTER — Encounter: Payer: Self-pay | Admitting: Family

## 2017-12-14 ENCOUNTER — Ambulatory Visit (INDEPENDENT_AMBULATORY_CARE_PROVIDER_SITE_OTHER): Payer: PPO | Admitting: Family

## 2017-12-14 VITALS — BP 142/88 | HR 97 | Temp 97.8°F | Ht 68.0 in | Wt 181.1 lb

## 2017-12-14 DIAGNOSIS — R079 Chest pain, unspecified: Secondary | ICD-10-CM

## 2017-12-14 DIAGNOSIS — R0789 Other chest pain: Secondary | ICD-10-CM | POA: Diagnosis not present

## 2017-12-14 DIAGNOSIS — S299XXA Unspecified injury of thorax, initial encounter: Secondary | ICD-10-CM | POA: Diagnosis not present
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
## 2017-12-20 MED FILL — ROSUVASTATIN CALCIUM 10 MG: 10 | 90 days supply | Qty: 90 | Fill #3

## 2017-12-20 MED FILL — REPAGLINIDE 1 MG TABS: 1 | 90 days supply | Qty: 270 | Fill #1

## 2017-12-21 ENCOUNTER — Encounter: Payer: Self-pay | Admitting: Internal Medicine

## 2018-01-07 ENCOUNTER — Other Ambulatory Visit: Payer: Self-pay | Admitting: Internal Medicine

## 2018-01-07 ENCOUNTER — Other Ambulatory Visit (INDEPENDENT_AMBULATORY_CARE_PROVIDER_SITE_OTHER): Payer: PPO

## 2018-01-07 DIAGNOSIS — E118 Type 2 diabetes mellitus with unspecified complications: Secondary | ICD-10-CM | POA: Diagnosis not present

## 2018-01-07 LAB — BASIC METABOLIC PANEL
BUN: 23 mg/dL (ref 6–23)
CALCIUM: 9.6 mg/dL (ref 8.4–10.5)
CO2: 23 mEq/L (ref 19–32)
Chloride: 102 mEq/L (ref 96–112)
Creatinine, Ser: 1.65 mg/dL — ABNORMAL HIGH (ref 0.40–1.50)
GFR: 43.77 mL/min — AB (ref >60.00)
Glucose, Bld: 117 mg/dL — ABNORMAL HIGH (ref 70–99)
POTASSIUM: 4.5 meq/L (ref 3.5–5.1)
Sodium: 138 mEq/L (ref 135–145)

## 2018-01-07 LAB — HEMOGLOBIN A1C: HEMOGLOBIN A1C: 6.3 % (ref 4.6–6.5)

## 2018-01-07 MED FILL — AMLODIPINE BESYLATE 5 MG TA: 5 | 30 days supply | Qty: 30 | Fill #0

## 2018-01-07 MED FILL — OMEPRAZOLE 20 MG CPDR: 20 | 90 days supply | Qty: 90 | Fill #1

## 2018-01-07 MED FILL — metFORMIN HCL 500 MG TABS: 500 | 90 days supply | Qty: 180 | Fill #1

## 2018-01-10 ENCOUNTER — Ambulatory Visit (INDEPENDENT_AMBULATORY_CARE_PROVIDER_SITE_OTHER): Payer: PPO | Admitting: Internal Medicine

## 2018-01-10 ENCOUNTER — Encounter: Payer: Self-pay | Admitting: Internal Medicine

## 2018-01-10 DIAGNOSIS — N182 Chronic kidney disease, stage 2 (mild): Secondary | ICD-10-CM | POA: Diagnosis not present

## 2018-01-10 DIAGNOSIS — N183 Chronic kidney disease, stage 3 unspecified: Secondary | ICD-10-CM

## 2018-01-10 DIAGNOSIS — M1000 Idiopathic gout, unspecified site: Secondary | ICD-10-CM | POA: Diagnosis not present

## 2018-01-10 DIAGNOSIS — S20219A Contusion of unspecified front wall of thorax, initial encounter: Secondary | ICD-10-CM | POA: Insufficient documentation

## 2018-01-10 DIAGNOSIS — I10 Essential (primary) hypertension: Secondary | ICD-10-CM | POA: Diagnosis not present

## 2018-01-10 DIAGNOSIS — S20219S Contusion of unspecified front wall of thorax, sequela: Secondary | ICD-10-CM | POA: Diagnosis not present

## 2018-01-10 DIAGNOSIS — E1122 Type 2 diabetes mellitus with diabetic chronic kidney disease: Secondary | ICD-10-CM | POA: Diagnosis not present

## 2018-01-10 MED ORDER — AMLODIPINE BESYLATE 5 MG PO TABS: 5.0000 mg | ORAL_TABLET | Freq: Every day | ORAL | 3 refills | Status: DC

## 2018-01-10 MED ORDER — ZOSTER VAC RECOMB ADJUVANTED 50 MCG/0.5ML IM SUSR: 0.5000 mL | Freq: Once | INTRAMUSCULAR | 1 refills | Status: AC

## 2018-01-10 NOTE — Assessment & Plan Note (Addendum)
Trulicity induced pancreatitis, abd pain - resolved Prandin cardiac CT scan for calcium scoring offered

## 2018-01-10 NOTE — Assessment & Plan Note (Addendum)
Amlodipine cardiac CT scan for calcium scoring offered
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
## 2018-01-10 NOTE — Patient Instructions (Signed)
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
## 2018-01-10 NOTE — Assessment & Plan Note (Signed)
On Allopurinol No relapse

## 2018-01-10 NOTE — Assessment & Plan Note (Signed)
Labs

## 2018-02-11 MED FILL — AMLODIPINE BESYLATE 5 MG TA: 5 | 90 days supply | Qty: 90 | Fill #0

## 2018-02-25 ENCOUNTER — Other Ambulatory Visit: Payer: Self-pay | Admitting: Internal Medicine

## 2018-02-25 MED FILL — ALLOPURINOL 300 MG TABS: 300 | 90 days supply | Qty: 90 | Fill #3

## 2018-02-27 MED FILL — TRANDOLAPRIL 4 MG TABLET: 4 | 90 days supply | Qty: 90 | Fill #0

## 2018-03-18 ENCOUNTER — Other Ambulatory Visit: Payer: Self-pay | Admitting: Internal Medicine

## 2018-03-18 MED FILL — REPAGLINIDE 1 MG TABLET: 1 | 90 days supply | Qty: 270 | Fill #2

## 2018-03-19 MED FILL — ROSUVASTATIN CALCIUM 10 MG: 10 | 90 days supply | Qty: 90 | Fill #0

## 2018-04-02 ENCOUNTER — Other Ambulatory Visit: Payer: Self-pay | Admitting: Internal Medicine

## 2018-04-02 MED FILL — metFORMIN HCL 500 MG TABS: 500 | 90 days supply | Qty: 180 | Fill #2

## 2018-04-02 MED FILL — OMEPRAZOLE 20 MG CPDR: 20 | 90 days supply | Qty: 90 | Fill #2

## 2018-04-02 MED FILL — FREESTYLE LANCETS: 50 days supply | Qty: 100 | Fill #0

## 2018-04-02 MED FILL — FREESTYLE LITE TEST STRIP: 50 days supply | Qty: 100 | Fill #1

## 2018-05-03 ENCOUNTER — Other Ambulatory Visit: Payer: Self-pay | Admitting: Internal Medicine

## 2018-05-03 MED FILL — FREESTYLE LANCETS: 50 days supply | Qty: 100 | Fill #1

## 2018-05-03 MED FILL — AMLODIPINE BESYLATE 5 MG TA: 5 | 90 days supply | Qty: 90 | Fill #1

## 2018-05-16 ENCOUNTER — Ambulatory Visit: Payer: PPO | Admitting: Internal Medicine

## 2018-05-20 ENCOUNTER — Ambulatory Visit: Payer: PPO | Admitting: Internal Medicine

## 2018-05-27 MED FILL — TRANDOLAPRIL 4 MG TABLET: 4 | 90 days supply | Qty: 90 | Fill #1

## 2018-05-27 MED FILL — ALLOPURINOL 300 MG TAB: 300 | 90 days supply | Qty: 90 | Fill #0

## 2018-06-11 MED FILL — REPAGLINIDE 1 MG TABLET: 1 | 90 days supply | Qty: 270 | Fill #3

## 2018-06-11 MED FILL — ROSUVASTATIN CALCIUM 10 MG: 10 | 90 days supply | Qty: 90 | Fill #1

## 2018-06-30 MED FILL — OMEPRAZOLE 20 MG CPDR: 20 | 90 days supply | Qty: 90 | Fill #3

## 2018-06-30 MED FILL — metFORMIN HCL 500 MG TABS: 500 | 90 days supply | Qty: 180 | Fill #3

## 2018-07-24 ENCOUNTER — Telehealth: Payer: Self-pay

## 2018-07-24 NOTE — Telephone Encounter (Signed)
Copied from [facility] [phone]. Topic: Appointment Scheduling - Scheduling Inquiry for Clinic >> Jul 24, 2018 11:15 AM Scherrie Gerlach wrote: Reason for CRM:  pt would like to know if he needs to keep his appt on 6/29.  Pt states his bp has been good, sugars are great, and he doesn't feel he should come in unless Dr Alain Marion thinks he should  pt states he usulaly has labs prior to his appt, and there are no orders. Pt states he is ok to put this out a couple more months if dr agrees since he is doing so well

## 2018-07-24 NOTE — Telephone Encounter (Signed)
1.  Okay to move the appointment to a later date as he prefers 2.  Lab orders are standing Thanks

## 2018-07-28 MED FILL — FREESTYLE LITE TEST STRIP: 50 days supply | Qty: 100 | Fill #2

## 2018-07-28 MED FILL — AMLODIPINE BESYLATE 5 MG TA: 5 | 90 days supply | Qty: 90 | Fill #2

## 2018-08-05 ENCOUNTER — Ambulatory Visit: Payer: PPO | Admitting: Internal Medicine

## 2018-08-25 MED FILL — TRANDOLAPRIL 4 MG TABLET: 4 | 90 days supply | Qty: 90 | Fill #2

## 2018-08-25 MED FILL — ALLOPURINOL 300 MG TAB: 300 | 90 days supply | Qty: 90 | Fill #1

## 2018-09-09 ENCOUNTER — Other Ambulatory Visit: Payer: Self-pay | Admitting: Internal Medicine

## 2018-09-09 MED FILL — ROSUVASTATIN CALCIUM 10 MG: 10 | 90 days supply | Qty: 90 | Fill #0

## 2018-09-16 ENCOUNTER — Other Ambulatory Visit: Payer: Self-pay | Admitting: Internal Medicine

## 2018-09-16 MED FILL — REPAGLINIDE 1 MG TABLET: 1 | 90 days supply | Qty: 270 | Fill #0

## 2018-09-23 ENCOUNTER — Other Ambulatory Visit: Payer: Self-pay | Admitting: Internal Medicine

## 2018-09-23 MED FILL — OMEPRAZOLE 20 MG CAP: 20 | 90 days supply | Qty: 90 | Fill #0

## 2018-09-23 MED FILL — metFORMIN HCL 500 MG TABS: 500 | 90 days supply | Qty: 180 | Fill #0

## 2018-10-18 ENCOUNTER — Ambulatory Visit (INDEPENDENT_AMBULATORY_CARE_PROVIDER_SITE_OTHER): Payer: PPO

## 2018-10-18 DIAGNOSIS — Z23 Encounter for immunization: Secondary | ICD-10-CM | POA: Diagnosis not present

## 2018-10-20 MED FILL — AMLODIPINE BESYLATE 5 MG TA: 5 | 90 days supply | Qty: 90 | Fill #3

## 2018-11-13 ENCOUNTER — Ambulatory Visit: Payer: PPO | Admitting: Internal Medicine

## 2018-11-17 MED FILL — TRANDOLAPRIL 4 MG TABLET: 4 | 90 days supply | Qty: 90 | Fill #3

## 2018-11-18 MED FILL — ALLOPURINOL 300 MG TABS: 300 | 90 days supply | Qty: 90 | Fill #2

## 2018-11-20 ENCOUNTER — Ambulatory Visit: Payer: PPO | Admitting: Internal Medicine

## 2018-12-08 MED FILL — ROSUVASTATIN CALCIUM 10 MG: 10 | 90 days supply | Qty: 90 | Fill #1

## 2018-12-11 ENCOUNTER — Other Ambulatory Visit (INDEPENDENT_AMBULATORY_CARE_PROVIDER_SITE_OTHER): Payer: PPO

## 2018-12-11 DIAGNOSIS — E118 Type 2 diabetes mellitus with unspecified complications: Secondary | ICD-10-CM

## 2018-12-11 LAB — BASIC METABOLIC PANEL
BUN: 19 mg/dL (ref 6–23)
CO2: 27 mEq/L (ref 19–32)
Calcium: 9.3 mg/dL (ref 8.4–10.5)
Chloride: 100 mEq/L (ref 96–112)
Creatinine, Ser: 1.49 mg/dL (ref 0.40–1.50)
GFR: 46.2 mL/min — ABNORMAL LOW (ref >60.00)
Glucose, Bld: 122 mg/dL — ABNORMAL HIGH (ref 70–99)
Potassium: 4.4 mEq/L (ref 3.5–5.1)
Sodium: 135 mEq/L (ref 135–145)

## 2018-12-11 LAB — HEMOGLOBIN A1C: Hgb A1c MFr Bld: 6.1 % (ref 4.6–6.5)

## 2018-12-16 ENCOUNTER — Other Ambulatory Visit: Payer: Self-pay | Admitting: Internal Medicine

## 2018-12-16 MED FILL — FREESTYLE LANCETS: 50 days supply | Qty: 100 | Fill #2

## 2018-12-17 ENCOUNTER — Ambulatory Visit (INDEPENDENT_AMBULATORY_CARE_PROVIDER_SITE_OTHER): Payer: PPO | Admitting: Internal Medicine

## 2018-12-17 ENCOUNTER — Other Ambulatory Visit: Payer: Self-pay

## 2018-12-17 ENCOUNTER — Encounter: Payer: Self-pay | Admitting: Internal Medicine

## 2018-12-17 DIAGNOSIS — N182 Chronic kidney disease, stage 2 (mild): Secondary | ICD-10-CM | POA: Diagnosis not present

## 2018-12-17 DIAGNOSIS — N400 Enlarged prostate without lower urinary tract symptoms: Secondary | ICD-10-CM

## 2018-12-17 DIAGNOSIS — E1122 Type 2 diabetes mellitus with diabetic chronic kidney disease: Secondary | ICD-10-CM | POA: Diagnosis not present

## 2018-12-17 DIAGNOSIS — I10 Essential (primary) hypertension: Secondary | ICD-10-CM | POA: Diagnosis not present

## 2018-12-17 DIAGNOSIS — N183 Chronic kidney disease, stage 3 unspecified: Secondary | ICD-10-CM | POA: Diagnosis not present

## 2018-12-17 DIAGNOSIS — E785 Hyperlipidemia, unspecified: Secondary | ICD-10-CM

## 2018-12-17 MED ORDER — TAMSULOSIN HCL 0.4 MG PO CAPS: 0.4000 mg | ORAL_CAPSULE | Freq: Every day | ORAL | 3 refills | Status: DC

## 2018-12-17 MED FILL — TAMSULOSIN HCL 0.4 MG CAP: 0.4 | 90 days supply | Qty: 90 | Fill #0

## 2018-12-17 MED FILL — FREESTYLE LITE TEST STRIP: 50 days supply | Qty: 100 | Fill #0

## 2018-12-17 NOTE — Assessment & Plan Note (Addendum)
Crestor CT ca sore test offered - declined

## 2018-12-17 NOTE — Assessment & Plan Note (Signed)
-   Flomax 

## 2018-12-17 NOTE — Assessment & Plan Note (Signed)
Labs reviewed.
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
## 2018-12-17 NOTE — Patient Instructions (Addendum)
Cardiac CT calcium scoring test $150 Tel # is [phone]   Computed tomography, more commonly known as a CT or CAT scan, is a diagnostic medical imaging test. Like traditional x-rays, it produces multiple images or pictures of the inside of the body. The cross-sectional images generated during a CT scan can be reformatted in multiple planes. They can even generate three-dimensional images. These images can be viewed on a computer monitor, printed on film or by a 3D printer, or transferred to a CD or DVD. CT images of internal organs, bones, soft tissue and blood vessels provide greater detail than traditional x-rays, particularly of soft tissues and blood vessels. A cardiac CT scan for coronary calcium is a non-invasive way of obtaining information about the presence, location and extent of calcified plaque in the coronary arteries-the vessels that supply oxygen-containing blood to the heart muscle. Calcified plaque results when there is a build-up of fat and other substances under the inner layer of the artery. This material can calcify which signals the presence of atherosclerosis, a disease of the vessel wall, also called coronary artery disease (CAD). People with this disease have an increased risk for heart attacks. In addition, over time, progression of plaque build up (CAD) can narrow the arteries or even close off blood flow to the heart. The result may be chest pain, sometimes called "angina," or a heart attack. Because calcium is a marker of CAD, the amount of calcium detected on a cardiac CT scan is a helpful prognostic tool. The findings on cardiac CT are expressed as a calcium score. Another name for this test is coronary artery calcium scoring.  What are some common uses of the procedure? The goal of cardiac CT scan for calcium scoring is to determine if CAD is present and to what extent, even if there are no symptoms. It is a screening study that may be recommended by a physician for  patients with risk factors for CAD but no clinical symptoms. The major risk factors for CAD are: . high blood cholesterol levels  . family history of heart attacks  . diabetes  . high blood pressure  . cigarette smoking  . overweight or obese  . physical inactivity   A negative cardiac CT scan for calcium scoring shows no calcification within the coronary arteries. This suggests that CAD is absent or so minimal it cannot be seen by this technique. The chance of having a heart attack over the next two to five years is very low under these circumstances. A positive test means that CAD is present, regardless of whether or not the patient is experiencing any symptoms. The amount of calcification-expressed as the calcium score-may help to predict the likelihood of a myocardial infarction (heart attack) in the coming years and helps your medical doctor or cardiologist decide whether the patient may need to take preventive medicine or undertake other measures such as diet and exercise to lower the risk for heart attack. The extent of CAD is graded according to your calcium score:  Calcium Score  Presence of CAD (coronary artery disease)  0 No evidence of CAD   1-10 Minimal evidence of CAD  11-100 Mild evidence of CAD  101-400 Moderate evidence of CAD  Over 400 Extensive evidence of CAD   If you have medicare related insurance (such as traditional Medicare, [employer], [employer], or similar), Please make an appointment at the scheduling desk with Sharee Pimple, the [employer], for your Wellness visit in this  office, which is a benefit with your insurance.

## 2018-12-17 NOTE — Assessment & Plan Note (Addendum)
Stable  Treat BPH Reduce aspirin use

## 2018-12-17 NOTE — Assessment & Plan Note (Signed)
Amlodipine.

## 2018-12-22 MED FILL — metFORMIN HCL 500 MG TABS: 500 | 90 days supply | Qty: 180 | Fill #1

## 2018-12-22 MED FILL — OMEPRAZOLE 20 MG CAP: 20 | 90 days supply | Qty: 90 | Fill #1

## 2018-12-30 ENCOUNTER — Other Ambulatory Visit: Payer: Self-pay

## 2019-01-06 ENCOUNTER — Ambulatory Visit (INDEPENDENT_AMBULATORY_CARE_PROVIDER_SITE_OTHER): Payer: PPO | Admitting: *Deleted

## 2019-01-06 DIAGNOSIS — Z0000 Encounter for general adult medical examination without abnormal findings: Secondary | ICD-10-CM | POA: Diagnosis not present

## 2019-01-06 NOTE — Progress Notes (Addendum)
Subjective:   Douglas Richards is a 73 y.o. male who presents for Medicare Annual/Subsequent preventive examination.  I connected with patient by a telephone and verified that I am speaking with the correct person using two identifiers. Patient stated full name and DOB. Patient gave permission to continue with telephonic visit. Patient's location was at home and Nurse's location was at [place] office. Participants during this visit included patient and nurse.  Review of Systems:   Cardiac Risk Factors include: advanced age (>23men, >67 women);diabetes mellitus;male gender Sleep patterns: feels rested on waking, gets up 2 times nightly to void and sleeps 7 hours nightly.    Home Safety/Smoke Alarms: Feels safe in home. Smoke alarms in place.  Living environment; residence and Firearm Safety: 1-story house/ trailer. Lives with wife, no needs for DME, good support system Seat Belt Safety/Bike Helmet: Wears seat belt.     Objective:    Vitals: There were no vitals taken for this visit.  There is no height or weight on file to calculate BMI.  Advanced Directives 01/06/2019 11/29/2017 11/14/2016 04/28/2015 12/25/2013  Does Patient Have a Medical Advance Directive? Yes Yes Yes Yes Yes  Type of Paramedic of [place];Living will [place];Living will [place];Living will - [place] in Chart? No - copy requested No - copy requested No - copy requested Yes -    Tobacco Social History   Tobacco Use  Smoking Status Never Smoker  Smokeless Tobacco Never Used     Counseling given: Not Answered  Past Medical History:  Diagnosis Date  . Allergic rhinitis   . Allergy   . Anal fissure   . Cataract    right eye   . Diabetes mellitus 2011   type II  . Diverticulosis of colon (without mention of hemorrhage)   . GERD (gastroesophageal reflux disease)   . Gout, unspecified    . Herpes zoster without mention of complication   . Hyperlipidemia   . Hypertension   . Osteoarthrosis, unspecified whether generalized or localized, unspecified site   . Other abnormal glucose   . Personal history of colonic polyps    Past Surgical History:  Procedure Laterality Date  . CATARACT EXTRACTION Right   . COLONOSCOPY    . HEMORRHOID SURGERY    . LIPOMA EXCISION     from the neck  . POLYPECTOMY    . rotator cuff surgery Right   . TONSILLECTOMY     Family History  Problem Relation Age of Onset  . COPD Mother   . Stroke Mother   . Hypertension Mother   . Diabetes Father   . COPD Father   . Alcohol abuse Father   . Stroke Father   . Hypertension Father   . Alcohol abuse Brother   . Arthritis Brother        lupus  . Heart disease Brother   . Hypertension Other        fam hx  . Heart disease Sister   . Colon cancer Neg Hx    Social History   Socioeconomic History  . Marital status: Married    Spouse name: Not on file  . Number of children: 1  . Years of education: Not on file  . Highest education level: Not on file  Occupational History  . Occupation: retired  Scientific laboratory technician  . Financial resource strain: Not hard at all  . Food  insecurity    Worry: Never true    Inability: Never true  . Transportation needs    Medical: No    Non-medical: No  Tobacco Use  . Smoking status: Never Smoker  . Smokeless tobacco: Never Used  Substance and Sexual Activity  . Alcohol use: Not Currently    Alcohol/week: 0.0 standard drinks    Comment: 1 beer every 2-3 months or so .  Marland Kitchen Drug use: No  . Sexual activity: Yes  Lifestyle  . Physical activity    Days per week: 4 days    Minutes per session: 40 min  . Stress: Not at all  Relationships  . Social connections    Talks on phone: More than three times a week    Gets together: More than three times a week    Attends religious service: More than 4 times per year    Active member of club or organization: Yes     Attends meetings of clubs or organizations: More than 4 times per year    Relationship status: Married  Other Topics Concern  . Not on file  Social History Narrative   Regular exercise- no.    Outpatient Encounter Medications as of 01/06/2019  Medication Sig  . allopurinol (ZYLOPRIM) 300 MG tablet TAKE 1 TABLET BY MOUTH ONCE DAILY  . amLODipine (NORVASC) 5 MG tablet Take 1 tablet (5 mg total) by mouth daily.  Marland Kitchen aspirin EC 325 MG tablet Take 325 mg by mouth daily.  . Cholecalciferol (VITAMIN D3) 1000 UNITS tablet Take 1,000 Units by mouth daily.    . Glucosamine-Chondroitin ([employer] GLUCOSAMINE-CHONDROITIN) 750-600 MG TABS Take by mouth 2 (two) times daily. Reported on 04/28/2015  . glucose blood (FREESTYLE LITE) test strip USE TO CHECK BLOOD SUGAR TWICE DAILY  . HYDROCORTISONE ACE, RECTAL, (HEMRIL-30) 30 MG SUPP Place 1 suppository (30 mg total) rectally 2 (two) times daily.  . Lancets (FREESTYLE) lancets USE TO CHECK BLOOD SUGAR TWICE DAILY  . loratadine (CLARITIN) 10 MG tablet Take 10 mg by mouth daily.  . metFORMIN (GLUCOPHAGE) 500 MG tablet TAKE 1 TABLET BY MOUTH TWICE DAILY WITH MEALS  . omeprazole (PRILOSEC) 20 MG capsule TAKE 1 CAPSULE BY MOUTH ONCE DAILY  . repaglinide (PRANDIN) 1 MG tablet TAKE 1 TABLET BY MOUTH THREE TIMES DAILY BEFORE MEALS.  . rosuvastatin (CRESTOR) 10 MG tablet TAKE 1 TABLET BY MOUTH ONCE DAILY  . tamsulosin (FLOMAX) 0.4 MG CAPS capsule Take 1 capsule (0.4 mg total) by mouth daily.  . trandolapril ([place]) 4 MG tablet TAKE 1 TABLET BY MOUTH ONCE DAILY   No facility-administered encounter medications on file as of 01/06/2019.     Activities of Daily Living In your present state of health, do you have any difficulty performing the following activities: 01/06/2019  Hearing? N  Vision? N  Difficulty concentrating or making decisions? N  Walking or climbing stairs? N  Dressing or bathing? N  Doing errands, shopping? N  Preparing Food and eating ? N  Using the  Toilet? N  In the past six months, have you accidently leaked urine? N  Do you have problems with loss of bowel control? N  Managing your Medications? N  Managing your Finances? N  Housekeeping or managing your Housekeeping? N  Some recent data might be hidden    Patient Care Team: Plotnikov, Evie Lacks, MD as PCP - General   Assessment:   This is a routine wellness examination for Shadi. Physical assessment deferred to PCP.  Exercise  Activities and Dietary recommendations Current Exercise Habits: Home exercise routine, Type of exercise: walking, Time (Minutes): 35, Frequency (Times/Week): 4, Weekly Exercise (Minutes/Week): 140, Intensity: Mild, Exercise limited by: orthopedic condition(s)  Diet (meal preparation, eat out, water intake, caffeinated beverages, dairy products, fruits and vegetables): in general, a "healthy" diet  , well balanced  eats a variety of fruits and vegetables daily, limits salt, fat/cholesterol, sugar,carbohydrates.   Reviewed heart healthy and diabetic diet. Encouraged patient to increase daily water and healthy fluid intake.    Goals      Patient Stated   . patient (pt-stated)     Wants to come off of his medications!!!       Other   . Continue to improve the mobility of  my shoulder, increase my exercise, monitor diet closer, and continue to see Dr. Harlen Labs    . Patient Stated     I want to increase my physical activity by me making up my mind to be more active.       Fall Risk Fall Risk  01/06/2019 11/29/2017 11/14/2016 09/29/2016 04/28/2015  Falls in the past year? 0 Yes No No No  Number falls in past yr: 0 1 - - -  Injury with Fall? 0 No - - -  Follow up - Falls prevention discussed - - -   Is the patient's home free of loose throw rugs in walkways, pet beds, electrical cords, etc?   yes      Grab bars in the bathroom? yes      Handrails on the stairs?   yes      Adequate lighting?   yes  Depression Screen PHQ 2/9 Scores 01/06/2019  11/29/2017 11/14/2016 09/29/2016  PHQ - 2 Score 0 0 0 0  PHQ- 9 Score - - 0 -    Cognitive Function MMSE - Mini Mental State Exam 11/14/2016 04/28/2015  Not completed: - (No Data)  Orientation to time 5 -  Orientation to Place 5 -  Registration 3 -  Attention/ Calculation 5 -  Recall 3 -  Language- name 2 objects 2 -  Language- repeat 1 -  Language- follow 3 step command 3 -  Language- read & follow direction 1 -  Write a sentence 1 -  Copy design 1 -  Total score 30 -       Ad8 score reviewed for issues:  Issues making decisions: no  Less interest in hobbies / activities: no  Repeats questions, stories (family complaining): no  Trouble using ordinary gadgets (microwave, computer, phone):no  Forgets the month or year: no  Mismanaging finances: no  Remembering appts: no  Daily problems with thinking and/or memory: no Ad8 score is= 0  Immunization History  Administered Date(s) Administered  . Fluad Quad(high Dose 65+) 10/18/2018  . Influenza Whole 11/28/2005, 11/27/2008  . Influenza, High Dose Seasonal PF 11/09/2015, 09/29/2016, 11/29/2017  . Influenza,inj,Quad PF,6+ Mos 10/28/2013, 10/21/2014  . Influenza-Unspecified 01/06/2013  . Pneumococcal Conjugate-13 01/13/2014  . Pneumococcal Polysaccharide-23 06/07/2011  . Td 07/23/2009   Screening Tests Health Maintenance  Topic Date Due  . OPHTHALMOLOGY EXAM  09/19/2018  . FOOT EXAM  11/30/2018  . COLONOSCOPY  01/16/2019  . HEMOGLOBIN A1C  06/10/2019  . TETANUS/TDAP  07/24/2019  . INFLUENZA VACCINE  Completed  . Hepatitis C Screening  Completed  . PNA vac Low Risk Adult  Completed       Plan:    Reviewed health maintenance screenings with patient today and relevant education, vaccines,  and/or referrals were provided.   Continue to eat heart healthy diet (full of fruits, vegetables, whole grains, lean protein, water--limit salt, fat, and sugar intake) and increase physical activity as tolerated.  Continue  doing brain stimulating activities (puzzles, reading, adult coloring books, staying active) to keep memory sharp.   I have personally reviewed and noted the following in the patient's chart:   . Medical and social history . Use of alcohol, tobacco or illicit drugs  . Current medications and supplements . Functional ability and status . Nutritional status . Physical activity . Advanced directives . List of other physicians . Screenings to include cognitive, depression, and falls . Referrals and appointments  In addition, I have reviewed and discussed with patient certain preventive protocols, quality metrics, and best practice recommendations. A written personalized care plan for preventive services as well as general preventive health recommendations were provided to patient.     Michiel Cowboy, RN  01/06/2019  Medical screening examination/treatment/procedure(s) were performed by non-physician practitioner and as supervising physician I was immediately available for consultation/collaboration. I agree with above. Lew Dawes, MD

## 2019-01-08 ENCOUNTER — Encounter: Payer: Self-pay | Admitting: Internal Medicine

## 2019-01-13 ENCOUNTER — Other Ambulatory Visit: Payer: Self-pay | Admitting: Internal Medicine

## 2019-01-13 MED FILL — AMLODIPINE BESYLATE 5 MG TA: 5 | 90 days supply | Qty: 90 | Fill #0

## 2019-01-13 MED FILL — REPAGLINIDE 1 MG TABLET: 1 | 90 days supply | Qty: 270 | Fill #1

## 2019-02-10 ENCOUNTER — Other Ambulatory Visit: Payer: Self-pay | Admitting: Internal Medicine

## 2019-02-10 MED FILL — TRANDOLAPRIL 4 MG TABLET: 4 | 90 days supply | Qty: 90 | Fill #0

## 2019-03-02 MED FILL — ROSUVASTATIN CALCIUM 10 MG: 10 | 90 days supply | Qty: 90 | Fill #2

## 2019-03-16 MED FILL — metFORMIN HCL 500 MG TABS: 500 | 90 days supply | Qty: 180 | Fill #2

## 2019-03-16 MED FILL — OMEPRAZOLE 20 MG CAP: 20 | 90 days supply | Qty: 90 | Fill #2

## 2019-03-16 MED FILL — TAMSULOSIN HCL 0.4 MG CAP: 0.4 | 90 days supply | Qty: 90 | Fill #1

## 2019-03-20 ENCOUNTER — Ambulatory Visit: Payer: PPO

## 2019-04-06 MED FILL — AMLODIPINE BESYLATE 5 MG TA: 5 | 90 days supply | Qty: 90 | Fill #1

## 2019-04-13 MED FILL — FREESTYLE LITE TEST STRIP: 50 days supply | Qty: 100 | Fill #1

## 2019-04-14 ENCOUNTER — Other Ambulatory Visit: Payer: Self-pay | Admitting: Internal Medicine

## 2019-04-14 MED FILL — FREESTYLE LANCETS: 50 days supply | Qty: 100 | Fill #0

## 2019-04-14 MED FILL — REPAGLINIDE 1 MG TABLET: 1 | 90 days supply | Qty: 270 | Fill #2

## 2019-04-21 ENCOUNTER — Other Ambulatory Visit: Payer: Self-pay | Admitting: Family

## 2019-04-23 MED FILL — METHOCARBAMOL 500 MG TABS: 500 | 10 days supply | Qty: 30 | Fill #0

## 2019-05-04 MED FILL — TRANDOLAPRIL 4 MG TABLET: 4 | 90 days supply | Qty: 90 | Fill #1

## 2019-05-05 ENCOUNTER — Other Ambulatory Visit: Payer: Self-pay | Admitting: Internal Medicine

## 2019-05-06 ENCOUNTER — Other Ambulatory Visit: Payer: Self-pay | Admitting: Internal Medicine

## 2019-05-06 MED FILL — ALLOPURINOL 300 MG TABS: 300 | 90 days supply | Qty: 90 | Fill #0

## 2019-05-25 MED FILL — ROSUVASTATIN CALCIUM 10 MG: 10 | 90 days supply | Qty: 90 | Fill #3

## 2019-06-09 MED FILL — METFORMIN HCL 500 MG TABS: 500 | 90 days supply | Qty: 180 | Fill #3

## 2019-06-10 ENCOUNTER — Other Ambulatory Visit (INDEPENDENT_AMBULATORY_CARE_PROVIDER_SITE_OTHER): Payer: PPO

## 2019-06-10 DIAGNOSIS — E118 Type 2 diabetes mellitus with unspecified complications: Secondary | ICD-10-CM

## 2019-06-10 LAB — BASIC METABOLIC PANEL
BUN: 24 mg/dL — ABNORMAL HIGH (ref 6–23)
CO2: 24 mEq/L (ref 19–32)
Calcium: 9.2 mg/dL (ref 8.4–10.5)
Chloride: 102 mEq/L (ref 96–112)
Creatinine, Ser: 1.7 mg/dL — ABNORMAL HIGH (ref 0.40–1.50)
GFR: 39.63 mL/min — ABNORMAL LOW (ref >60.00)
Glucose, Bld: 103 mg/dL — ABNORMAL HIGH (ref 70–99)
Potassium: 4.3 mEq/L (ref 3.5–5.1)
Sodium: 134 mEq/L — ABNORMAL LOW (ref 135–145)

## 2019-06-10 LAB — HEMOGLOBIN A1C: Hgb A1c MFr Bld: 6.2 % (ref 4.6–6.5)

## 2019-06-17 ENCOUNTER — Encounter: Payer: Self-pay | Admitting: Internal Medicine

## 2019-06-17 ENCOUNTER — Ambulatory Visit (INDEPENDENT_AMBULATORY_CARE_PROVIDER_SITE_OTHER): Payer: PPO | Admitting: Internal Medicine

## 2019-06-17 ENCOUNTER — Other Ambulatory Visit: Payer: Self-pay

## 2019-06-17 DIAGNOSIS — E1122 Type 2 diabetes mellitus with diabetic chronic kidney disease: Secondary | ICD-10-CM | POA: Diagnosis not present

## 2019-06-17 DIAGNOSIS — M545 Low back pain, unspecified: Secondary | ICD-10-CM | POA: Insufficient documentation

## 2019-06-17 DIAGNOSIS — N182 Chronic kidney disease, stage 2 (mild): Secondary | ICD-10-CM | POA: Diagnosis not present

## 2019-06-17 DIAGNOSIS — G8929 Other chronic pain: Secondary | ICD-10-CM

## 2019-06-17 DIAGNOSIS — E785 Hyperlipidemia, unspecified: Secondary | ICD-10-CM | POA: Diagnosis not present

## 2019-06-17 DIAGNOSIS — N400 Enlarged prostate without lower urinary tract symptoms: Secondary | ICD-10-CM

## 2019-06-17 DIAGNOSIS — N183 Chronic kidney disease, stage 3 unspecified: Secondary | ICD-10-CM | POA: Diagnosis not present

## 2019-06-17 DIAGNOSIS — N2889 Other specified disorders of kidney and ureter: Secondary | ICD-10-CM

## 2019-06-17 DIAGNOSIS — I10 Essential (primary) hypertension: Secondary | ICD-10-CM

## 2019-06-17 MED ORDER — HYDROCODONE-ACETAMINOPHEN 5-325 MG PO TABS: 1.0000 | ORAL_TABLET | Freq: Four times a day (QID) | ORAL | 0 refills | Status: DC | PRN

## 2019-06-17 MED ORDER — ASPIRIN EC 81 MG PO TBEC: 81.0000 mg | DELAYED_RELEASE_TABLET | Freq: Every day | ORAL | 3 refills | Status: DC

## 2019-06-17 NOTE — Assessment & Plan Note (Signed)
-   Crestor 

## 2019-06-17 NOTE — Assessment & Plan Note (Signed)
Worse D/c ASA

## 2019-06-17 NOTE — Assessment & Plan Note (Signed)
Norco prn  Potential benefits of a long term opioids use as well as potential risks (i.e. addiction risk, apnea etc) and complications (i.e. Somnolence, constipation and others) were explained to the patient and were aknowledged. 

## 2019-06-17 NOTE — Assessment & Plan Note (Signed)
A1c is good Labs in 3 mo

## 2019-06-17 NOTE — Assessment & Plan Note (Signed)
BP Readings from Last 3 Encounters:  06/17/19 132/84  12/17/18 (!) 142/86  01/10/18 (!) 146/82
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
## 2019-06-17 NOTE — Assessment & Plan Note (Signed)
-   Flomax 

## 2019-07-06 MED FILL — AMLODIPINE BESYLATE 5 MG TA: 5 | 90 days supply | Qty: 90 | Fill #2

## 2019-07-08 MED FILL — REPAGLINIDE 1 MG TABLET: 1 | 90 days supply | Qty: 270 | Fill #3

## 2019-08-04 ENCOUNTER — Other Ambulatory Visit: Payer: Self-pay | Admitting: Internal Medicine

## 2019-08-04 MED FILL — ALLOPURINOL 300 MG TABS: 300 | 90 days supply | Qty: 90 | Fill #1

## 2019-08-04 MED FILL — TRANDOLAPRIL 4 MG TABLET: 4 | 90 days supply | Qty: 90 | Fill #0

## 2019-08-18 ENCOUNTER — Other Ambulatory Visit: Payer: Self-pay | Admitting: Internal Medicine

## 2019-08-18 MED FILL — ROSUVASTATIN CALCIUM 10 MG: 10 | 90 days supply | Qty: 90 | Fill #0

## 2019-08-31 MED FILL — TAMSULOSIN HCL 0.4 MG CAP: 0.4 | 90 days supply | Qty: 90 | Fill #3

## 2019-09-01 ENCOUNTER — Other Ambulatory Visit: Payer: Self-pay | Admitting: Internal Medicine

## 2019-09-01 MED FILL — OMEPRAZOLE 20 MG CAP: 20 | 90 days supply | Qty: 90 | Fill #0

## 2019-09-08 ENCOUNTER — Other Ambulatory Visit: Payer: Self-pay | Admitting: Internal Medicine

## 2019-09-08 MED FILL — METFORMIN HCL 500 MG TABS: 500 | 90 days supply | Qty: 180 | Fill #0

## 2019-09-18 DIAGNOSIS — Z961 Presence of intraocular lens: Secondary | ICD-10-CM | POA: Diagnosis not present

## 2019-09-18 DIAGNOSIS — H18413 Arcus senilis, bilateral: Secondary | ICD-10-CM | POA: Diagnosis not present

## 2019-09-18 DIAGNOSIS — H4911 Fourth [trochlear] nerve palsy, right eye: Secondary | ICD-10-CM | POA: Diagnosis not present

## 2019-09-18 DIAGNOSIS — H35373 Puckering of macula, bilateral: Secondary | ICD-10-CM | POA: Diagnosis not present

## 2019-09-26 ENCOUNTER — Other Ambulatory Visit: Payer: Self-pay

## 2019-09-26 ENCOUNTER — Encounter (HOSPITAL_COMMUNITY): Payer: Self-pay

## 2019-09-26 ENCOUNTER — Ambulatory Visit (HOSPITAL_COMMUNITY)
Admission: EM | Admit: 2019-09-26 | Discharge: 2019-09-26 | Disposition: A | Discharge status: Home / Self Care | Payer: PPO | Attending: Family Medicine | Admitting: Family Medicine

## 2019-09-26 DIAGNOSIS — N183 Chronic kidney disease, stage 3 unspecified: Secondary | ICD-10-CM | POA: Insufficient documentation

## 2019-09-26 DIAGNOSIS — Z7984 Long term (current) use of oral hypoglycemic drugs: Secondary | ICD-10-CM | POA: Diagnosis not present

## 2019-09-26 DIAGNOSIS — E1122 Type 2 diabetes mellitus with diabetic chronic kidney disease: Secondary | ICD-10-CM | POA: Insufficient documentation

## 2019-09-26 DIAGNOSIS — M545 Low back pain: Secondary | ICD-10-CM | POA: Diagnosis not present

## 2019-09-26 DIAGNOSIS — I129 Hypertensive chronic kidney disease with stage 1 through stage 4 chronic kidney disease, or unspecified chronic kidney disease: Secondary | ICD-10-CM | POA: Diagnosis not present

## 2019-09-26 DIAGNOSIS — Z7901 Long term (current) use of anticoagulants: Secondary | ICD-10-CM | POA: Insufficient documentation

## 2019-09-26 DIAGNOSIS — M1990 Unspecified osteoarthritis, unspecified site: Secondary | ICD-10-CM | POA: Diagnosis not present

## 2019-09-26 DIAGNOSIS — J069 Acute upper respiratory infection, unspecified: Secondary | ICD-10-CM | POA: Insufficient documentation

## 2019-09-26 DIAGNOSIS — R05 Cough: Secondary | ICD-10-CM | POA: Insufficient documentation

## 2019-09-26 DIAGNOSIS — Z7982 Long term (current) use of aspirin: Secondary | ICD-10-CM | POA: Insufficient documentation

## 2019-09-26 DIAGNOSIS — E785 Hyperlipidemia, unspecified: Secondary | ICD-10-CM | POA: Insufficient documentation

## 2019-09-26 DIAGNOSIS — R519 Headache, unspecified: Secondary | ICD-10-CM | POA: Insufficient documentation

## 2019-09-26 DIAGNOSIS — M109 Gout, unspecified: Secondary | ICD-10-CM | POA: Insufficient documentation

## 2019-09-26 DIAGNOSIS — Z20822 Contact with and (suspected) exposure to covid-19: Secondary | ICD-10-CM | POA: Diagnosis not present

## 2019-09-26 DIAGNOSIS — Z79899 Other long term (current) drug therapy: Secondary | ICD-10-CM | POA: Diagnosis not present

## 2019-09-26 LAB — SARS CORONAVIRUS 2 (TAT 6-24 HRS): SARS Coronavirus 2: NEGATIVE

## 2019-09-26 MED ORDER — BENZONATATE 200 MG PO CAPS: 200.0000 mg | ORAL_CAPSULE | Freq: Three times a day (TID) | ORAL | 0 refills | Status: DC | PRN

## 2019-09-26 MED FILL — BENZONATATE 200 MG CAP: 200 | 6 days supply | Qty: 20 | Fill #0

## 2019-09-26 NOTE — Discharge Instructions (Signed)
Go home to rest Drink plenty of fluids Take Tylenol for pain or fever You may take over-the-counter cough and cold medicines as needed You must quarantine at home until your test result is available You can check for your test result in MyChart  

## 2019-09-26 NOTE — ED Provider Notes (Signed)
[place]    CSN: [id number] Arrival date & time: 09/26/19  6195      History   Chief Complaint Chief Complaint  Patient presents with  . Cough    HPI Douglas Richards is a 74 y.o. male.   HPI  Patient is here for cough.  He states he has a "head cold".  He has had his Covid vaccinations.  He is here with his wife who has chronic bronchitis and has more symptoms than he.  He states that he has had some clear runny nose and sputum, coughing, no shortness of breath.  No loss of taste or smell.  No body ache.  No fever or chills.  Moderate headaches.  Controlled with Tylenol.  Past Medical History:  Diagnosis Date  . Allergic rhinitis   . Allergy   . Anal fissure   . Cataract    right eye   . Diabetes mellitus 2011   type II  . Diverticulosis of colon (without mention of hemorrhage)   . GERD (gastroesophageal reflux disease)   . Gout, unspecified   . Herpes zoster without mention of complication   . Hyperlipidemia   . Hypertension   . Osteoarthrosis, unspecified whether generalized or localized, unspecified site   . Other abnormal glucose   . Personal history of colonic polyps     Patient Active Problem List   Diagnosis Date Noted  . Low back pain 06/17/2019  . Chest wall contusion 01/10/2018  . CRI (chronic renal insufficiency), stage 3 (moderate) 09/10/2017  . Right shoulder pain 05/12/2016  . Cough 05/04/2015  . Abdominal pain 11/04/2014  . Abdominal bloating 11/04/2014  . Grief reaction 09/17/2013  . Neck pain, bilateral 04/24/2013  . Rash and nonspecific skin eruption 10/03/2012  . Well adult exam 05/21/2012  . BPH (benign prostatic hyperplasia) 05/21/2012  . Stress 02/21/2012  . Bronchitis, acute 01/13/2011  . DM2 (diabetes mellitus, type 2) ([place]) 07/23/2009  . SINUSITIS- ACUTE-NOS 03/30/2009  . OLECRANON BURSITIS, LEFT 11/27/2008  . TENDINITIS 04/01/2008  . ELBOW PAIN, LEFT 01/09/2008  . PAIN IN JOINT, ANKLE AND FOOT 01/09/2008  . HERPES  ZOSTER 07/12/2007  . DIVERTICULOSIS, COLON 07/12/2007  . ANAL FISSURE, HX OF 07/12/2007  . TONSILLECTOMY, HX OF 07/12/2007  . HEMORRHOIDECTOMY, HX OF 07/12/2007  . OSTEOARTHRITIS 03/30/2007  . Dyslipidemia 03/29/2007  . Gout 03/29/2007  . Essential hypertension 03/29/2007  . ALLERGIC RHINITIS 03/29/2007  . ABNORMAL GLUCOSE NEC 03/29/2007  . COLONIC POLYPS, HX OF 03/29/2007    Past Surgical History:  Procedure Laterality Date  . CATARACT EXTRACTION Right   . COLONOSCOPY    . HEMORRHOID SURGERY    . LIPOMA EXCISION     from the neck  . POLYPECTOMY    . rotator cuff surgery Right   . TONSILLECTOMY         Home Medications    Prior to Admission medications   Medication Sig Start Date End Date Taking? Authorizing Provider  allopurinol (ZYLOPRIM) 300 MG tablet TAKE 1 TABLET BY MOUTH ONCE DAILY 05/06/19  Yes Plotnikov, Evie Lacks, MD  amLODipine (NORVASC) 5 MG tablet TAKE 1 TABLET BY MOUTH DAILY. 01/13/19  Yes Plotnikov, Evie Lacks, MD  aspirin EC 81 MG tablet Take 1 tablet (81 mg total) by mouth daily. 06/17/19 06/16/20 Yes Plotnikov, Evie Lacks, MD  Cholecalciferol (VITAMIN D3) 1000 UNITS tablet Take 1,000 Units by mouth daily.     Yes [provider]  Glucosamine-Chondroitin ([facility] GLUCOSAMINE-CHONDROITIN) 750-600 MG TABS  Take by mouth 2 (two) times daily. Reported on 04/28/2015   Yes [provider]  loratadine (CLARITIN) 10 MG tablet Take 10 mg by mouth daily.   Yes [provider]  metFORMIN (GLUCOPHAGE) 500 MG tablet TAKE 1 TABLET BY MOUTH TWICE DAILY WITH MEALS 09/08/19  Yes Plotnikov, Evie Lacks, MD  omeprazole (PRILOSEC) 20 MG capsule TAKE 1 CAPSULE BY MOUTH ONCE DAILY 09/01/19  Yes Plotnikov, Evie Lacks, MD  repaglinide (PRANDIN) 1 MG tablet TAKE 1 TABLET BY MOUTH THREE TIMES DAILY BEFORE MEALS. 09/16/18  Yes Plotnikov, Evie Lacks, MD  rosuvastatin (CRESTOR) 10 MG tablet Take 1 tablet (10 mg total) by mouth daily. Annual appt w/labs due in Sept must see  provider for future refills 08/18/19  Yes Plotnikov, Evie Lacks, MD  tamsulosin (FLOMAX) 0.4 MG CAPS capsule Take 1 capsule (0.4 mg total) by mouth daily. 12/17/18  Yes Plotnikov, Evie Lacks, MD  trandolapril (MAVIK) 4 MG tablet TAKE 1 TABLET BY MOUTH ONCE DAILY 08/04/19  Yes Plotnikov, Evie Lacks, MD  benzonatate (TESSALON) 200 MG capsule Take 1 capsule (200 mg total) by mouth 3 (three) times daily as needed for cough. 09/26/19   Raylene Everts, MD  glucose blood (FREESTYLE LITE) test strip USE TO CHECK BLOOD SUGAR TWICE DAILY 12/17/18   Plotnikov, Evie Lacks, MD  HYDROcodone-acetaminophen (NORCO/VICODIN) 5-325 MG tablet Take 1 tablet by mouth every 6 (six) hours as needed for severe pain. 06/17/19 06/16/20  Plotnikov, Evie Lacks, MD  HYDROCORTISONE ACE, RECTAL, (HEMRIL-30) 30 MG SUPP Place 1 suppository (30 mg total) rectally 2 (two) times daily. 06/07/11   Plotnikov, Evie Lacks, MD  Lancets (FREESTYLE) lancets USE TO CHECK BLOOD SUGAR TWICE DAILY. Annual appt is due w/lab must see provider for future refills 04/14/19   Plotnikov, Evie Lacks, MD  methocarbamol (ROBAXIN) 500 MG tablet TAKE 1 TABLET BY MOUTH EVERY 8 HOURS AS NEEDED. 04/23/19   Plotnikov, Evie Lacks, MD    Family History Family History  Problem Relation Age of Onset  . COPD Mother   . Stroke Mother   . Hypertension Mother   . Diabetes Father   . COPD Father   . Alcohol abuse Father   . Stroke Father   . Hypertension Father   . Alcohol abuse Brother   . Arthritis Brother        lupus  . Heart disease Brother   . Hypertension Other        fam hx  . Heart disease Sister   . Colon cancer Neg Hx     Social History Social History   Tobacco Use  . Smoking status: Never Smoker  . Smokeless tobacco: Never Used  Vaping Use  . Vaping Use: Never used  Substance Use Topics  . Alcohol use: Not Currently    Alcohol/week: 0.0 standard drinks    Comment: 1 beer every 2-3 months or so .  Marland Kitchen Drug use: No     Allergies   Atorvastatin,  Metformin, Prednisolone, Sulfonamide derivatives, and Trulicity [dulaglutide]   Review of Systems Review of Systems  See HPI Physical Exam Triage Vital Signs ED Triage Vitals  Enc Vitals Group     BP 09/26/19 1144 136/80     Pulse Rate 09/26/19 1144 92     Resp 09/26/19 1144 18     Temp 09/26/19 1144 99.3 F (37.4 C)     Temp Source 09/26/19 1144 Oral     SpO2 09/26/19 1144 98 %     Weight --  Height --      Head Circumference --      Peak Flow --      Pain Score 09/26/19 1143 0     Pain Loc --      Pain Edu? --      Excl. in [place]? --    No data found.  Updated Vital Signs BP 136/80 (BP Location: Right Arm)   Pulse 92   Temp 99.3 F (37.4 C) (Oral)   Resp 18   SpO2 98%   Physical Exam Constitutional:      General: He is not in acute distress.    Appearance: He is well-developed.  HENT:     Head: Normocephalic and atraumatic.     Nose: Rhinorrhea present.     Comments: Clear rhinorrhea.  Posterior pharynx benign    Mouth/Throat:     Comments: Mask is in place Eyes:     Conjunctiva/sclera: Conjunctivae normal.     Pupils: Pupils are equal, round, and reactive to light.  Cardiovascular:     Rate and Rhythm: Normal rate and regular rhythm.     Heart sounds: Normal heart sounds.  Pulmonary:     Effort: Pulmonary effort is normal. No respiratory distress.     Breath sounds: Normal breath sounds.     Comments: Lungs are clear Abdominal:     General: There is no distension.     Palpations: Abdomen is soft.  Musculoskeletal:        General: Normal range of motion.     Cervical back: Normal range of motion and neck supple.  Skin:    General: Skin is warm and dry.  Neurological:     Mental Status: He is alert.  Psychiatric:        Behavior: Behavior normal.      UC Treatments / Results  Labs (all labs ordered are listed, but only abnormal results are displayed) Labs Reviewed  SARS CORONAVIRUS 2 (TAT 6-24 HRS)    EKG   Radiology No results  found.  Procedures Procedures (including critical care time)  Medications Ordered in UC Medications - No data to display  Initial Impression / Assessment and Plan / UC Course  I have reviewed the triage vital signs and the nursing notes.  Pertinent labs & imaging results that were available during my care of the patient were reviewed by me and considered in my medical decision making (see chart for details).     Viral URI.  Covid testing done at patient request.  Quarantine until test results are available Final Clinical Impressions(s) / UC Diagnoses   Final diagnoses:  Viral URI with cough     Discharge Instructions     Go home to rest Drink plenty of fluids Take Tylenol for pain or fever You may take over-the-counter cough and cold medicines as needed You must quarantine at home until your test result is available You can check for your test result in MyChart    ED Prescriptions    Medication Sig Dispense Auth. Provider   benzonatate (TESSALON) 200 MG capsule Take 1 capsule (200 mg total) by mouth 3 (three) times daily as needed for cough. 20 capsule Raylene Everts, MD     PDMP not reviewed this encounter.   Raylene Everts, MD 09/26/19 [phone]

## 2019-09-26 NOTE — ED Triage Notes (Signed)
Pt c/o productive cough with clear sputum, runny nose with clear secretions, sinus congestio, HA for approx 3 days. Reports irritated throat that pt attributes to coughing.  Denies ear ache, SOB, CP, fever, chills, n/v/d, abdominal pain. Took sudafed last night.

## 2019-10-05 MED FILL — AMLODIPINE BESYLATE 5 MG TA: 5 | 90 days supply | Qty: 90 | Fill #3

## 2019-10-14 ENCOUNTER — Other Ambulatory Visit: Payer: Self-pay | Admitting: Internal Medicine

## 2019-10-15 ENCOUNTER — Other Ambulatory Visit (INDEPENDENT_AMBULATORY_CARE_PROVIDER_SITE_OTHER): Payer: PPO

## 2019-10-15 ENCOUNTER — Other Ambulatory Visit: Payer: Self-pay | Admitting: Internal Medicine

## 2019-10-15 DIAGNOSIS — E118 Type 2 diabetes mellitus with unspecified complications: Secondary | ICD-10-CM | POA: Diagnosis not present

## 2019-10-15 LAB — HEMOGLOBIN A1C: Hgb A1c MFr Bld: 6.5 % (ref 4.6–6.5)

## 2019-10-15 LAB — BASIC METABOLIC PANEL
BUN: 25 mg/dL — ABNORMAL HIGH (ref 6–23)
CO2: 24 mEq/L (ref 19–32)
Calcium: 9.5 mg/dL (ref 8.4–10.5)
Chloride: 101 mEq/L (ref 96–112)
Creatinine, Ser: 1.52 mg/dL — ABNORMAL HIGH (ref 0.40–1.50)
GFR: 45.05 mL/min — ABNORMAL LOW (ref >60.00)
Glucose, Bld: 115 mg/dL — ABNORMAL HIGH (ref 70–99)
Potassium: 4.3 mEq/L (ref 3.5–5.1)
Sodium: 135 mEq/L (ref 135–145)

## 2019-10-15 MED FILL — REPAGLINIDE 1 MG TABLET: 1 | 90 days supply | Qty: 270 | Fill #0

## 2019-10-22 ENCOUNTER — Encounter: Payer: Self-pay | Admitting: Internal Medicine

## 2019-10-22 ENCOUNTER — Other Ambulatory Visit: Payer: Self-pay

## 2019-10-22 ENCOUNTER — Ambulatory Visit (INDEPENDENT_AMBULATORY_CARE_PROVIDER_SITE_OTHER): Payer: PPO | Admitting: Internal Medicine

## 2019-10-22 VITALS — BP 136/80 | HR 96 | Temp 98.1°F | Ht 68.0 in | Wt 183.0 lb

## 2019-10-22 DIAGNOSIS — G8929 Other chronic pain: Secondary | ICD-10-CM | POA: Diagnosis not present

## 2019-10-22 DIAGNOSIS — R05 Cough: Secondary | ICD-10-CM | POA: Diagnosis not present

## 2019-10-22 DIAGNOSIS — R059 Cough, unspecified: Secondary | ICD-10-CM

## 2019-10-22 DIAGNOSIS — I10 Essential (primary) hypertension: Secondary | ICD-10-CM

## 2019-10-22 DIAGNOSIS — Z23 Encounter for immunization: Secondary | ICD-10-CM

## 2019-10-22 DIAGNOSIS — E1122 Type 2 diabetes mellitus with diabetic chronic kidney disease: Secondary | ICD-10-CM | POA: Diagnosis not present

## 2019-10-22 DIAGNOSIS — N182 Chronic kidney disease, stage 2 (mild): Secondary | ICD-10-CM | POA: Diagnosis not present

## 2019-10-22 DIAGNOSIS — M545 Low back pain: Secondary | ICD-10-CM

## 2019-10-22 DIAGNOSIS — N183 Chronic kidney disease, stage 3 unspecified: Secondary | ICD-10-CM | POA: Diagnosis not present

## 2019-10-22 MED ORDER — HYDROCODONE-ACETAMINOPHEN 5-325 MG PO TABS
1.0000 | ORAL_TABLET | Freq: Four times a day (QID) | ORAL | 0 refills | Status: DC | PRN
Start: 2019-10-22 — End: 2020-02-08

## 2019-10-22 MED FILL — HYDROCODON-APAP 5-325: 5-325 | 7 days supply | Qty: 28 | Fill #0

## 2019-10-22 NOTE — Addendum Note (Signed)
Addended by: Lauralee Evener C on: 10/22/2019 11:02 AM   Modules accepted: Orders

## 2019-10-22 NOTE — Assessment & Plan Note (Signed)
Labs

## 2019-10-22 NOTE — Progress Notes (Signed)
Subjective:  Patient ID: Douglas Richards, male    DOB: [birth year]  Age: 74 y.o. MRN: [id number]  CC: No chief complaint on file.   HPI Douglas Richards presents for HTN, DM, gout C/o cough - post URI 4 wks ago. The pt does not want to switch from [facility] Medications Prior to Visit  Medication Sig Dispense Refill  . allopurinol (ZYLOPRIM) 300 MG tablet TAKE 1 TABLET BY MOUTH ONCE DAILY 90 tablet 3  . amLODipine (NORVASC) 5 MG tablet TAKE 1 TABLET BY MOUTH DAILY. 90 tablet 3  . aspirin EC 81 MG tablet Take 1 tablet (81 mg total) by mouth daily. 100 tablet 3  . benzonatate (TESSALON) 200 MG capsule Take 1 capsule (200 mg total) by mouth 3 (three) times daily as needed for cough. 20 capsule 0  . Cholecalciferol (VITAMIN D3) 1000 UNITS tablet Take 1,000 Units by mouth daily.      . Glucosamine-Chondroitin ([facility] GLUCOSAMINE-CHONDROITIN) 750-600 MG TABS Take by mouth 2 (two) times daily. Reported on 04/28/2015    . glucose blood (FREESTYLE LITE) test strip USE TO CHECK BLOOD SUGAR TWICE DAILY 100 strip 5  . HYDROcodone-acetaminophen (NORCO/VICODIN) 5-325 MG tablet Take 1 tablet by mouth every 6 (six) hours as needed for severe pain. 20 tablet 0  . HYDROCORTISONE ACE, RECTAL, (HEMRIL-30) 30 MG SUPP Place 1 suppository (30 mg total) rectally 2 (two) times daily. 60 each 1  . Lancets (FREESTYLE) lancets USE TO CHECK BLOOD SUGAR TWICE DAILY. Annual appt is due w/lab must see provider for future refills 100 each 0  . loratadine (CLARITIN) 10 MG tablet Take 10 mg by mouth daily.    . metFORMIN (GLUCOPHAGE) 500 MG tablet TAKE 1 TABLET BY MOUTH TWICE DAILY WITH MEALS 180 tablet 3  . methocarbamol (ROBAXIN) 500 MG tablet TAKE 1 TABLET BY MOUTH EVERY 8 HOURS AS NEEDED. 30 tablet 0  . omeprazole (PRILOSEC) 20 MG capsule TAKE 1 CAPSULE BY MOUTH ONCE DAILY 90 capsule 3  . repaglinide (PRANDIN) 1 MG tablet TAKE 1 TABLET BY MOUTH THREE TIMES DAILY BEFORE MEALS. 270 tablet 3  . rosuvastatin (CRESTOR) 10  MG tablet Take 1 tablet (10 mg total) by mouth daily. Annual appt w/labs due in Sept must see provider for future refills 90 tablet 0  . tamsulosin (FLOMAX) 0.4 MG CAPS capsule Take 1 capsule (0.4 mg total) by mouth daily. 90 capsule 3  . trandolapril (MAVIK) 4 MG tablet TAKE 1 TABLET BY MOUTH ONCE DAILY 90 tablet 1   No facility-administered medications prior to visit.    ROS: Review of Systems  Constitutional: Negative for appetite change, fatigue and unexpected weight change.  HENT: Negative for congestion, nosebleeds, sneezing, sore throat and trouble swallowing.   Eyes: Negative for itching and visual disturbance.  Respiratory: Positive for cough.   Cardiovascular: Negative for chest pain, palpitations and leg swelling.  Gastrointestinal: Negative for abdominal distention, blood in stool, diarrhea and nausea.  Genitourinary: Negative for frequency and hematuria.  Musculoskeletal: Negative for back pain, gait problem, joint swelling and neck pain.  Skin: Negative for rash.  Neurological: Negative for dizziness, tremors, speech difficulty and weakness.  Psychiatric/Behavioral: Negative for agitation, dysphoric mood and sleep disturbance. The patient is not nervous/anxious.     Objective:  BP 136/80 (BP Location: Right Arm, Patient Position: Standing, Cuff Size: Large)   Pulse 96   Temp 98.1 F (36.7 C) (Oral)   Ht 5\' 8"  (1.727 m)   Wt 183 lb (  83 kg)   SpO2 97%   BMI 27.83 kg/m   BP Readings from Last 3 Encounters:  10/22/19 136/80  09/26/19 136/80  06/17/19 132/84    Wt Readings from Last 3 Encounters:  10/22/19 183 lb (83 kg)  06/17/19 186 lb (84.4 kg)  12/17/18 176 lb (79.8 kg)    Physical Exam Constitutional:      General: He is not in acute distress.    Appearance: He is well-developed.     Comments: NAD  Eyes:     Conjunctiva/sclera: Conjunctivae normal.     Pupils: Pupils are equal, round, and reactive to light.  Neck:     Thyroid: No thyromegaly.      Vascular: No JVD.  Cardiovascular:     Rate and Rhythm: Normal rate and regular rhythm.     Heart sounds: Normal heart sounds. No murmur heard.  No friction rub. No gallop.   Pulmonary:     Effort: Pulmonary effort is normal. No respiratory distress.     Breath sounds: Normal breath sounds. No wheezing or rales.  Chest:     Chest wall: No tenderness.  Abdominal:     General: Bowel sounds are normal. There is no distension.     Palpations: Abdomen is soft. There is no mass.     Tenderness: There is no abdominal tenderness. There is no guarding or rebound.  Musculoskeletal:        General: No tenderness. Normal range of motion.     Cervical back: Normal range of motion.  Lymphadenopathy:     Cervical: No cervical adenopathy.  Skin:    General: Skin is warm and dry.     Findings: No rash.  Neurological:     Mental Status: He is alert and oriented to person, place, and time.     Cranial Nerves: No cranial nerve deficit.     Motor: No abnormal muscle tone.     Coordination: Coordination normal.     Gait: Gait normal.     Deep Tendon Reflexes: Reflexes are normal and symmetric.  Psychiatric:        Behavior: Behavior normal.        Thought Content: Thought content normal.        Judgment: Judgment normal.     Lab Results  Component Value Date   WBC 6.0 09/17/2015   HGB 13.5 09/17/2015   HCT 40.5 09/17/2015   PLT 151.0 09/17/2015   GLUCOSE 115 (H) 10/15/2019   CHOL 122 09/17/2015   TRIG 150.0 (H) 09/17/2015   HDL 40.30 09/17/2015   LDLDIRECT 105.5 09/10/2013   LDLCALC 51 09/17/2015   ALT 13 09/17/2015   AST 15 09/17/2015   NA 135 10/15/2019   K 4.3 10/15/2019   CL 101 10/15/2019   CREATININE 1.52 (H) 10/15/2019   BUN 25 (H) 10/15/2019   CO2 24 10/15/2019   TSH 2.65 09/17/2015   PSA 0.81 09/17/2015   HGBA1C 6.5 10/15/2019   MICROALBUR 0.4 03/31/2010    No results found.  Assessment & Plan:    [name], MD

## 2019-10-22 NOTE — Assessment & Plan Note (Addendum)
Better Back brace Norco prn   Potential benefits of a long term opioids use as well as potential risks (i.e. addiction risk, apnea etc) and complications (i.e. Somnolence, constipation and others) were explained to the patient and were aknowledged.

## 2019-10-22 NOTE — Assessment & Plan Note (Signed)
Cough - post URI 4 wks ago. The pt does not want to switch from [facility]

## 2019-10-22 NOTE — Assessment & Plan Note (Addendum)
Cough - post URI 4 wks ago. The pt does not want to switch from [facility] prn Pt declined CXR

## 2019-10-22 NOTE — Assessment & Plan Note (Signed)
Hydrate well 

## 2019-10-26 MED FILL — TRANDOLAPRIL 4 MG TABLET: 4 | 90 days supply | Qty: 90 | Fill #1

## 2019-11-10 ENCOUNTER — Other Ambulatory Visit: Payer: Self-pay | Admitting: Internal Medicine

## 2019-11-10 MED FILL — ROSUVASTATIN CALCIUM 10 MG: 10 | 90 days supply | Qty: 90 | Fill #0

## 2019-11-23 MED FILL — OMEPRAZOLE 20 MG CAP: 20 | 90 days supply | Qty: 90 | Fill #1

## 2019-11-24 ENCOUNTER — Other Ambulatory Visit: Payer: Self-pay | Admitting: Internal Medicine

## 2019-11-24 MED FILL — TAMSULOSIN HCL 0.4 MG CAP: 0.4 | 90 days supply | Qty: 90 | Fill #0

## 2019-11-24 MED FILL — METHOCARBAMOL 500 MG TABS: 500 | 10 days supply | Qty: 30 | Fill #0

## 2019-11-26 DIAGNOSIS — H5032 Intermittent alternating esotropia: Secondary | ICD-10-CM | POA: Diagnosis not present

## 2019-11-26 DIAGNOSIS — H5022 Vertical strabismus, left eye: Secondary | ICD-10-CM | POA: Diagnosis not present

## 2019-11-30 MED FILL — ALLOPURINOL 300 MG TABS: 300 | 90 days supply | Qty: 90 | Fill #2

## 2019-11-30 MED FILL — METFORMIN HCL 500 MG TABS: 500 | 90 days supply | Qty: 180 | Fill #1

## 2019-12-29 ENCOUNTER — Other Ambulatory Visit: Payer: Self-pay | Admitting: Internal Medicine

## 2019-12-29 MED FILL — AMLODIPINE BESYLATE 5 MG TA: 5 | 90 days supply | Qty: 90 | Fill #0

## 2020-01-19 ENCOUNTER — Other Ambulatory Visit: Payer: Self-pay | Admitting: Internal Medicine

## 2020-01-19 MED FILL — REPAGLINIDE 1 MG TABLET: 1 | 90 days supply | Qty: 270 | Fill #1

## 2020-01-19 MED FILL — TRANDOLAPRIL 4 MG TABLET: 4 | 90 days supply | Qty: 90 | Fill #0

## 2020-02-01 MED FILL — ROSUVASTATIN CALCIUM 10 MG: 10 | 90 days supply | Qty: 90 | Fill #1

## 2020-02-02 ENCOUNTER — Other Ambulatory Visit: Payer: Self-pay | Admitting: Internal Medicine

## 2020-02-02 ENCOUNTER — Encounter: Payer: Self-pay | Admitting: Internal Medicine

## 2020-02-08 ENCOUNTER — Other Ambulatory Visit: Payer: Self-pay | Admitting: Internal Medicine

## 2020-02-09 MED FILL — HYDROCODON-APAP 5-325: 5-325 | 7 days supply | Qty: 28 | Fill #0

## 2020-02-15 MED FILL — TAMSULOSIN HCL 0.4 MG CAP: 0.4 | 90 days supply | Qty: 90 | Fill #1

## 2020-02-15 MED FILL — OMEPRAZOLE 20 MG CAP: 20 | 90 days supply | Qty: 90 | Fill #2

## 2020-02-17 ENCOUNTER — Other Ambulatory Visit (INDEPENDENT_AMBULATORY_CARE_PROVIDER_SITE_OTHER): Payer: PPO

## 2020-02-17 DIAGNOSIS — E118 Type 2 diabetes mellitus with unspecified complications: Secondary | ICD-10-CM | POA: Diagnosis not present

## 2020-02-17 LAB — BASIC METABOLIC PANEL
BUN: 25 mg/dL — ABNORMAL HIGH (ref 6–23)
CO2: 25 mEq/L (ref 19–32)
Calcium: 9.8 mg/dL (ref 8.4–10.5)
Chloride: 99 mEq/L (ref 96–112)
Creatinine, Ser: 1.76 mg/dL — ABNORMAL HIGH (ref 0.40–1.50)
GFR: 37.65 mL/min — ABNORMAL LOW (ref >60.00)
Glucose, Bld: 107 mg/dL — ABNORMAL HIGH (ref 70–99)
Potassium: 5 mEq/L (ref 3.5–5.1)
Sodium: 134 mEq/L — ABNORMAL LOW (ref 135–145)

## 2020-02-17 LAB — HEMOGLOBIN A1C: Hgb A1c MFr Bld: 6.6 % — ABNORMAL HIGH (ref 4.6–6.5)

## 2020-02-22 MED FILL — ALLOPURINOL 300 MG TABS: 300 | 90 days supply | Qty: 90 | Fill #3

## 2020-02-22 MED FILL — METFORMIN HCL 500 MG TABS: 500 | 90 days supply | Qty: 180 | Fill #2

## 2020-02-23 ENCOUNTER — Ambulatory Visit: Payer: PPO | Admitting: Internal Medicine

## 2020-02-23 ENCOUNTER — Ambulatory Visit: Payer: PPO

## 2020-02-26 ENCOUNTER — Other Ambulatory Visit: Payer: Self-pay | Admitting: Internal Medicine

## 2020-02-26 ENCOUNTER — Other Ambulatory Visit: Payer: Self-pay

## 2020-02-26 ENCOUNTER — Encounter: Payer: Self-pay | Admitting: Internal Medicine

## 2020-02-26 ENCOUNTER — Ambulatory Visit (INDEPENDENT_AMBULATORY_CARE_PROVIDER_SITE_OTHER): Payer: PPO | Admitting: Internal Medicine

## 2020-02-26 DIAGNOSIS — G8929 Other chronic pain: Secondary | ICD-10-CM

## 2020-02-26 DIAGNOSIS — I10 Essential (primary) hypertension: Secondary | ICD-10-CM

## 2020-02-26 DIAGNOSIS — N183 Chronic kidney disease, stage 3 unspecified: Secondary | ICD-10-CM

## 2020-02-26 DIAGNOSIS — M1000 Idiopathic gout, unspecified site: Secondary | ICD-10-CM | POA: Diagnosis not present

## 2020-02-26 DIAGNOSIS — M545 Low back pain, unspecified: Secondary | ICD-10-CM

## 2020-02-26 MED ORDER — ALLOPURINOL 100 MG PO TABS: ORAL_TABLET | ORAL | 3 refills | Status: DC

## 2020-02-26 MED ORDER — HYDROCODONE-ACETAMINOPHEN 5-325 MG PO TABS
ORAL_TABLET | ORAL | 0 refills | Status: DC
Start: 2020-02-26 — End: 2020-02-26

## 2020-02-26 MED FILL — HYDROCODON-APAP 5-325: 5-325 | 15 days supply | Qty: 60 | Fill #0

## 2020-02-26 NOTE — Assessment & Plan Note (Signed)
  BP nl at home 

## 2020-02-26 NOTE — Assessment & Plan Note (Signed)
Taking ASA 325 mg bid "all my life" - must stop

## 2020-02-26 NOTE — Assessment & Plan Note (Signed)
Tylenol prn Taking ASA 325 mg bid "all my life" - must stop Norco prn prn  Potential benefits of a long term opioids use as well as potential risks (i.e. addiction risk, apnea etc) and complications (i.e. Somnolence, constipation and others) were explained to the patient and were aknowledged.

## 2020-02-26 NOTE — Patient Instructions (Addendum)
For a mild COVID-19 case you can take zinc 50 mg a day for 1 week, vitamin C 1000 mg daily for 1 week, vitamin D2 50,000 units weekly for 2 months, Quercetin 500 mg twice a day for 1 week.  Maintain good oral hydration and take Tylenol with high fever.  You can buy COVID-19 express tests for home use. They are inexpensive, roughly $15 for 2 tests.  There is a link below:  https://www.[facility].info/   [place] shovel  Turmeric for arthritis

## 2020-02-26 NOTE — Progress Notes (Signed)
Subjective:  Patient ID: Douglas Richards, male    DOB: 1945/05/25  Age: 75 y.o. MRN: [id number]  CC: Follow-up (3 month f/u)   HPI Douglas Richards presents for OA, CRI, DM 2 Taking ASA 325 mg bid "all my life"  Outpatient Medications Prior to Visit  Medication Sig Dispense Refill  . allopurinol (ZYLOPRIM) 300 MG tablet TAKE 1 TABLET BY MOUTH ONCE DAILY 90 tablet 3  . amLODipine (NORVASC) 5 MG tablet TAKE 1 TABLET BY MOUTH DAILY. 90 tablet 2  . aspirin EC 81 MG tablet Take 1 tablet (81 mg total) by mouth daily. 100 tablet 3  . benzonatate (TESSALON) 200 MG capsule Take 1 capsule (200 mg total) by mouth 3 (three) times daily as needed for cough. 20 capsule 0  . Cholecalciferol (VITAMIN D3) 1000 UNITS tablet Take 1,000 Units by mouth daily.    . Glucosamine-Chondroitin 750-600 MG TABS Take by mouth 2 (two) times daily. Reported on 04/28/2015    . glucose blood (FREESTYLE LITE) test strip USE TO CHECK BLOOD SUGAR TWICE DAILY 100 strip 5  . HYDROcodone-acetaminophen (NORCO/VICODIN) 5-325 MG tablet TAKE 1 TABLET BY MOUTH EVERY 6 HOURS AS NEEDED FOR SEVERE PAIN 28 tablet 0  . HYDROCORTISONE ACE, RECTAL, (HEMRIL-30) 30 MG SUPP Place 1 suppository (30 mg total) rectally 2 (two) times daily. 60 each 1  . Lancets (FREESTYLE) lancets USE TO CHECK BLOOD SUGAR TWICE DAILY. Annual appt is due w/lab must see provider for future refills 100 each 0  . loratadine (CLARITIN) 10 MG tablet Take 10 mg by mouth daily.    . metFORMIN (GLUCOPHAGE) 500 MG tablet TAKE 1 TABLET BY MOUTH TWICE DAILY WITH MEALS 180 tablet 3  . methocarbamol (ROBAXIN) 500 MG tablet TAKE 1 TABLET BY MOUTH EVERY 8 HOURS AS NEEDED. 30 tablet 0  . omeprazole (PRILOSEC) 20 MG capsule TAKE 1 CAPSULE BY MOUTH ONCE DAILY 90 capsule 3  . repaglinide (PRANDIN) 1 MG tablet TAKE 1 TABLET BY MOUTH THREE TIMES DAILY BEFORE MEALS. 270 tablet 3  . rosuvastatin (CRESTOR) 10 MG tablet TAKE 1 TABLET BY MOUTH ONCE A DAY **DUE FOR ANNUAL APPOINTMENT** 90  tablet 3  . tamsulosin (FLOMAX) 0.4 MG CAPS capsule TAKE 1 CAPSULE BY MOUTH ONCE DAILY 90 capsule 3  . trandolapril (MAVIK) 4 MG tablet TAKE 1 TABLET BY MOUTH ONCE DAILY 90 tablet 1   No facility-administered medications prior to visit.    ROS: Review of Systems  Constitutional: Negative for appetite change, fatigue and unexpected weight change.  HENT: Negative for congestion, nosebleeds, sneezing, sore throat and trouble swallowing.   Eyes: Negative for itching and visual disturbance.  Respiratory: Negative for cough.   Cardiovascular: Negative for chest pain, palpitations and leg swelling.  Gastrointestinal: Negative for abdominal distention, blood in stool, diarrhea and nausea.  Genitourinary: Negative for frequency and hematuria.  Musculoskeletal: Positive for arthralgias and back pain. Negative for gait problem, joint swelling and neck pain.  Skin: Negative for rash.  Neurological: Negative for dizziness, tremors, speech difficulty and weakness.  Psychiatric/Behavioral: Negative for agitation, dysphoric mood and sleep disturbance. The patient is not nervous/anxious.     Objective:  BP 140/82 (BP Location: Left Arm)   Pulse 80   Temp 97.9 F (36.6 C) (Oral)   Wt 187 lb 3.2 oz (84.9 kg)   SpO2 97%   BMI 28.46 kg/m   BP Readings from Last 3 Encounters:  02/26/20 140/82  10/22/19 136/80  09/26/19 136/80    Wt  Readings from Last 3 Encounters:  02/26/20 187 lb 3.2 oz (84.9 kg)  10/22/19 183 lb (83 kg)  06/17/19 186 lb (84.4 kg)    Physical Exam Constitutional:      General: He is not in acute distress.    Appearance: He is well-developed.     Comments: NAD  HENT:     Mouth/Throat:     Mouth: Oropharynx is clear and moist.  Eyes:     Conjunctiva/sclera: Conjunctivae normal.     Pupils: Pupils are equal, round, and reactive to light.  Neck:     Thyroid: No thyromegaly.     Vascular: No JVD.  Cardiovascular:     Rate and Rhythm: Normal rate and regular rhythm.      Pulses: Intact distal pulses.     Heart sounds: Normal heart sounds. No murmur heard. No friction rub. No gallop.   Pulmonary:     Effort: Pulmonary effort is normal. No respiratory distress.     Breath sounds: Normal breath sounds. No wheezing or rales.  Chest:     Chest wall: No tenderness.  Abdominal:     General: Bowel sounds are normal. There is no distension.     Palpations: Abdomen is soft. There is no mass.     Tenderness: There is no abdominal tenderness. There is no guarding or rebound.  Musculoskeletal:        General: Tenderness present. No edema. Normal range of motion.     Cervical back: Normal range of motion.  Lymphadenopathy:     Cervical: No cervical adenopathy.  Skin:    General: Skin is warm and dry.     Findings: No rash.  Neurological:     Mental Status: He is alert and oriented to person, place, and time.     Cranial Nerves: No cranial nerve deficit.     Motor: No abnormal muscle tone.     Coordination: He displays a negative Romberg sign. Coordination normal.     Gait: Gait normal.     Deep Tendon Reflexes: Reflexes are normal and symmetric.  Psychiatric:        Mood and Affect: Mood and affect normal.        Behavior: Behavior normal.        Thought Content: Thought content normal.        Judgment: Judgment normal.     Lab Results  Component Value Date   WBC 6.0 09/17/2015   HGB 13.5 09/17/2015   HCT 40.5 09/17/2015   PLT 151.0 09/17/2015   GLUCOSE 107 (H) 02/17/2020   CHOL 122 09/17/2015   TRIG 150.0 (H) 09/17/2015   HDL 40.30 09/17/2015   LDLDIRECT 105.5 09/10/2013   LDLCALC 51 09/17/2015   ALT 13 09/17/2015   AST 15 09/17/2015   NA 134 (L) 02/17/2020   K 5.0 02/17/2020   CL 99 02/17/2020   CREATININE 1.76 (H) 02/17/2020   BUN 25 (H) 02/17/2020   CO2 25 02/17/2020   TSH 2.65 09/17/2015   PSA 0.81 09/17/2015   HGBA1C 6.6 (H) 02/17/2020   MICROALBUR 0.4 03/31/2010    No results found.  Assessment & Plan:     [name], MD

## 2020-02-27 ENCOUNTER — Ambulatory Visit (INDEPENDENT_AMBULATORY_CARE_PROVIDER_SITE_OTHER): Payer: PPO

## 2020-02-27 DIAGNOSIS — Z0000 Encounter for general adult medical examination without abnormal findings: Secondary | ICD-10-CM

## 2020-02-27 NOTE — Patient Instructions (Signed)
[name] , Thank you for taking time to come for your Medicare Wellness Visit. I appreciate your ongoing commitment to your health goals. Please review the following plan we discussed and let me know if I can assist you in the future.   Screening recommendations/referrals: Colonoscopy: last done 01/15/2014; due every 5 years (scheduled for 03/30/2020) Recommended yearly ophthalmology/optometry visit for glaucoma screening and checkup Recommended yearly dental visit for hygiene and checkup  Vaccinations: Influenza vaccine: 10/22/2019, due every year Pneumococcal vaccine: completed Tdap vaccine: 10/22/2019, due every 10 years Shingles vaccine: never done   Covid-19: completed  Advanced directives: Please bring a copy of your health care power of attorney and living will to the office at your convenience.  Conditions/risks identified: Yes; Reviewed health maintenance screenings with patient today and relevant education, vaccines, and/or referrals were provided. Please continue to do your personal lifestyle choices by: daily care of teeth and gums, regular physical activity (goal should be 5 days a week for 30 minutes), eat a healthy diet, avoid tobacco and drug use, limiting any alcohol intake, taking a low-dose aspirin (if not allergic or have been advised by your provider otherwise) and taking vitamins and minerals as recommended by your provider. Continue doing brain stimulating activities (puzzles, reading, adult coloring books, staying active) to keep memory sharp. Continue to eat heart healthy diet (full of fruits, vegetables, whole grains, lean protein, water--limit salt, fat, and sugar intake) and increase physical activity as tolerated.  Next appointment: Please schedule your next Medicare Wellness Visit with your Nurse Health Advisor in 1 year by calling [phone].  Preventive Care 59 Years and Older, Male Preventive care refers to lifestyle choices and visits with your health care  provider that can promote health and wellness. What does preventive care include?  A yearly physical exam. This is also called an annual well check.  Dental exams once or twice a year.  Routine eye exams. Ask your health care provider how often you should have your eyes checked.  Personal lifestyle choices, including:  Daily care of your teeth and gums.  Regular physical activity.  Eating a healthy diet.  Avoiding tobacco and drug use.  Limiting alcohol use.  Practicing safe sex.  Taking low doses of aspirin every day.  Taking vitamin and mineral supplements as recommended by your health care provider. What happens during an annual well check? The services and screenings done by your health care provider during your annual well check will depend on your age, overall health, lifestyle risk factors, and family history of disease. Counseling  Your health care provider may ask you questions about your:  Alcohol use.  Tobacco use.  Drug use.  Emotional well-being.  Home and relationship well-being.  Sexual activity.  Eating habits.  History of falls.  Memory and ability to understand (cognition).  Work and work Statistician. Screening  You may have the following tests or measurements:  Height, weight, and BMI.  Blood pressure.  Lipid and cholesterol levels. These may be checked every 5 years, or more frequently if you are over 6 years old.  Skin check.  Lung cancer screening. You may have this screening every year starting at age 77 if you have a 30-pack-year history of smoking and currently smoke or have quit within the past 15 years.  Fecal occult blood test (FOBT) of the stool. You may have this test every year starting at age 69.  Flexible sigmoidoscopy or colonoscopy. You may have a sigmoidoscopy every 5 years or a  colonoscopy every 10 years starting at age 66.  Prostate cancer screening. Recommendations will vary depending on your family history and  other risks.  Hepatitis C blood test.  Hepatitis B blood test.  Sexually transmitted disease (STD) testing.  Diabetes screening. This is done by checking your blood sugar (glucose) after you have not eaten for a while (fasting). You may have this done every 1-3 years.  Abdominal aortic aneurysm (AAA) screening. You may need this if you are a current or former smoker.  Osteoporosis. You may be screened starting at age 72 if you are at high risk. Talk with your health care provider about your test results, treatment options, and if necessary, the need for more tests. Vaccines  Your health care provider may recommend certain vaccines, such as:  Influenza vaccine. This is recommended every year.  Tetanus, diphtheria, and acellular pertussis (Tdap, Td) vaccine. You may need a Td booster every 10 years.  Zoster vaccine. You may need this after age 60.  Pneumococcal 13-valent conjugate (PCV13) vaccine. One dose is recommended after age 20.  Pneumococcal polysaccharide (PPSV23) vaccine. One dose is recommended after age 80. Talk to your health care provider about which screenings and vaccines you need and how often you need them. This information is not intended to replace advice given to you by your health care provider. Make sure you discuss any questions you have with your health care provider. Document Released: 02/19/2015 Document Revised: 10/13/2015 Document Reviewed: 11/24/2014 Elsevier Interactive Patient Education  2017 [place] Prevention in the Home Falls can cause injuries. They can happen to people of all ages. There are many things you can do to make your home safe and to help prevent falls. What can I do on the outside of my home?  Regularly fix the edges of walkways and driveways and fix any cracks.  Remove anything that might make you trip as you walk through a door, such as a raised step or threshold.  Trim any bushes or trees on the path to your  home.  Use bright outdoor lighting.  Clear any walking paths of anything that might make someone trip, such as rocks or tools.  Regularly check to see if handrails are loose or broken. Make sure that both sides of any steps have handrails.  Any raised decks and porches should have guardrails on the edges.  Have any leaves, snow, or ice cleared regularly.  Use sand or salt on walking paths during winter.  Clean up any spills in your garage right away. This includes oil or grease spills. What can I do in the bathroom?  Use night lights.  Install grab bars by the toilet and in the tub and shower. Do not use towel bars as grab bars.  Use non-skid mats or decals in the tub or shower.  If you need to sit down in the shower, use a plastic, non-slip stool.  Keep the floor dry. Clean up any water that spills on the floor as soon as it happens.  Remove soap buildup in the tub or shower regularly.  Attach bath mats securely with double-sided non-slip rug tape.  Do not have throw rugs and other things on the floor that can make you trip. What can I do in the bedroom?  Use night lights.  Make sure that you have a light by your bed that is easy to reach.  Do not use any sheets or blankets that are too big for your bed. They  should not hang down onto the floor.  Have a firm chair that has side arms. You can use this for support while you get dressed.  Do not have throw rugs and other things on the floor that can make you trip. What can I do in the kitchen?  Clean up any spills right away.  Avoid walking on wet floors.  Keep items that you use a lot in easy-to-reach places.  If you need to reach something above you, use a strong step stool that has a grab bar.  Keep electrical cords out of the way.  Do not use floor polish or wax that makes floors slippery. If you must use wax, use non-skid floor wax.  Do not have throw rugs and other things on the floor that can make you  trip. What can I do with my stairs?  Do not leave any items on the stairs.  Make sure that there are handrails on both sides of the stairs and use them. Fix handrails that are broken or loose. Make sure that handrails are as long as the stairways.  Check any carpeting to make sure that it is firmly attached to the stairs. Fix any carpet that is loose or worn.  Avoid having throw rugs at the top or bottom of the stairs. If you do have throw rugs, attach them to the floor with carpet tape.  Make sure that you have a light switch at the top of the stairs and the bottom of the stairs. If you do not have them, ask someone to add them for you. What else can I do to help prevent falls?  Wear shoes that:  Do not have high heels.  Have rubber bottoms.  Are comfortable and fit you well.  Are closed at the toe. Do not wear sandals.  If you use a stepladder:  Make sure that it is fully opened. Do not climb a closed stepladder.  Make sure that both sides of the stepladder are locked into place.  Ask someone to hold it for you, if possible.  Clearly mark and make sure that you can see:  Any grab bars or handrails.  First and last steps.  Where the edge of each step is.  Use tools that help you move around (mobility aids) if they are needed. These include:  Canes.  Walkers.  Scooters.  Crutches.  Turn on the lights when you go into a dark area. Replace any light bulbs as soon as they burn out.  Set up your furniture so you have a clear path. Avoid moving your furniture around.  If any of your floors are uneven, fix them.  If there are any pets around you, be aware of where they are.  Review your medicines with your doctor. Some medicines can make you feel dizzy. This can increase your chance of falling. Ask your doctor what other things that you can do to help prevent falls. This information is not intended to replace advice given to you by your health care provider. Make  sure you discuss any questions you have with your health care provider. Document Released: 11/19/2008 Document Revised: 07/01/2015 Document Reviewed: 02/27/2014 Elsevier Interactive Patient Education  2017 [employer].

## 2020-02-27 NOTE — Progress Notes (Addendum)
I connected with Douglas Richards today by telephone and verified that I am speaking with the correct person using two identifiers. Location patient: home Location provider: work Persons participating in the virtual visit: Douglas Richards and [employer], LPN.   I discussed the limitations, risks, security and privacy concerns of performing an evaluation and management service by telephone and the availability of in person appointments. I also discussed with the patient that there may be a patient responsible charge related to this service. The patient expressed understanding and verbally consented to this telephonic visit.    Interactive audio and video telecommunications were attempted between this provider and patient, however failed, due to patient having technical difficulties OR patient did not have access to video capability.  We continued and completed visit with audio only.  Some vital signs may be absent or patient reported.   Time Spent with patient on telephone encounter: 30 minutes  Subjective:   Douglas Richards is a 75 y.o. male who presents for Medicare Annual/Subsequent preventive examination.  Review of Systems    No ROS. Medicare Wellness Visit. Additional risk factors are reflected in social history. Cardiac Risk Factors include: advanced age (>58men, >68 women);diabetes mellitus;dyslipidemia;family history of premature cardiovascular disease;hypertension;male gender     Objective:    There were no vitals filed for this visit. There is no height or weight on file to calculate BMI.  Advanced Directives 02/27/2020 01/06/2019 11/29/2017 11/14/2016 04/28/2015 12/25/2013  Does Patient Have a Medical Advance Directive? Yes Yes Yes Yes Yes Yes  Type of Advance Directive - [place];Living will [place];Living will [place];Living will - [place]  Does patient want to make changes to medical advance  directive? No - Patient declined - - - - -  Copy of [place] in Chart? - No - copy requested No - copy requested No - copy requested Yes -    Current Medications (verified) Outpatient Encounter Medications as of 02/27/2020  Medication Sig   allopurinol (ZYLOPRIM) 100 MG tablet As directed   amLODipine (NORVASC) 5 MG tablet TAKE 1 TABLET BY MOUTH DAILY.   aspirin EC 81 MG tablet Take 1 tablet (81 mg total) by mouth daily.   benzonatate (TESSALON) 200 MG capsule Take 1 capsule (200 mg total) by mouth 3 (three) times daily as needed for cough.   Cholecalciferol (VITAMIN D3) 1000 UNITS tablet Take 1,000 Units by mouth daily.   Glucosamine-Chondroitin 750-600 MG TABS Take by mouth 2 (two) times daily. Reported on 04/28/2015   glucose blood (FREESTYLE LITE) test strip USE TO CHECK BLOOD SUGAR TWICE DAILY   HYDROcodone-acetaminophen (NORCO/VICODIN) 5-325 MG tablet TAKE 1 TABLET BY MOUTH EVERY 6 HOURS AS NEEDED FOR SEVERE PAIN   HYDROCORTISONE ACE, RECTAL, (HEMRIL-30) 30 MG SUPP Place 1 suppository (30 mg total) rectally 2 (two) times daily.   Lancets (FREESTYLE) lancets USE TO CHECK BLOOD SUGAR TWICE DAILY. Annual appt is due w/lab must see provider for future refills   loratadine (CLARITIN) 10 MG tablet Take 10 mg by mouth daily.   metFORMIN (GLUCOPHAGE) 500 MG tablet TAKE 1 TABLET BY MOUTH TWICE DAILY WITH MEALS   methocarbamol (ROBAXIN) 500 MG tablet TAKE 1 TABLET BY MOUTH EVERY 8 HOURS AS NEEDED.   omeprazole (PRILOSEC) 20 MG capsule TAKE 1 CAPSULE BY MOUTH ONCE DAILY   repaglinide (PRANDIN) 1 MG tablet TAKE 1 TABLET BY MOUTH THREE TIMES DAILY BEFORE MEALS.   rosuvastatin (CRESTOR) 10  MG tablet TAKE 1 TABLET BY MOUTH ONCE A DAY **DUE FOR ANNUAL APPOINTMENT**   tamsulosin (FLOMAX) 0.4 MG CAPS capsule TAKE 1 CAPSULE BY MOUTH ONCE DAILY   trandolapril (MAVIK) 4 MG tablet TAKE 1 TABLET BY MOUTH ONCE DAILY   No facility-administered encounter medications on file as of 02/27/2020.     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   Diverticulosis of colon (without mention of hemorrhage)    GERD (gastroesophageal reflux disease)    Gout, unspecified    Herpes zoster without mention of complication    Hyperlipidemia    Hypertension    Osteoarthrosis, unspecified whether generalized or localized, unspecified site    Other abnormal glucose    Personal history of colonic polyps    Past Surgical History:  Procedure Laterality Date   CATARACT EXTRACTION Right    COLONOSCOPY     HEMORRHOID SURGERY     LIPOMA EXCISION     from the neck   POLYPECTOMY     rotator cuff surgery Right    TONSILLECTOMY     Family History  Problem Relation Age of Onset   COPD Mother    Stroke Mother    Hypertension Mother    Diabetes Father    COPD Father    Alcohol abuse Father    Stroke Father    Hypertension Father    Alcohol abuse Brother    Arthritis Brother        lupus   Heart disease Brother    Hypertension Other        fam hx   Heart disease Sister    Colon cancer Neg Hx    Social History   Socioeconomic History   Marital status: Married    Spouse name: Not on file   Number of children: 1   Years of education: Not on file   Highest education level: Not on file  Occupational History   Occupation: retired  Tobacco Use   Smoking status: Never Smoker   Smokeless tobacco: Never Used  Scientific laboratory technician Use: Never used  Substance and Sexual Activity   Alcohol use: Not Currently    Alcohol/week: 0.0 standard drinks    Comment: 1 beer every 2-3 months or so .   Drug use: No   Sexual activity: Yes  Other Topics Concern   Not on file  Social History Narrative   Regular exercise- no.   Social Determinants of Health    Financial Resource Strain: Low Risk    Difficulty of Paying Living Expenses: Not hard at all  Food Insecurity: No Food Insecurity   Worried About Charity fundraiser in the Last Year: Never true   [place] in the Last Year: Never true  Transportation Needs: No Transportation Needs   Lack of Transportation (Medical): No   Lack of Transportation (Non-Medical): No  Physical Activity: Inactive   Days of Exercise per Week: 0 days   Minutes of Exercise per Session: 0 min  Stress: No Stress Concern Present   Feeling of Stress : Not at all  Social Connections: Socially Integrated   Frequency of Communication with Friends and Family: More than three times a week   Frequency of Social  Gatherings with Friends and Family: Once a week   Attends Religious Services: More than 4 times per year   Active Member of [employer] or Organizations: No   Attends Music therapist: More than 4 times per year   Marital Status: Married    Tobacco Counseling Counseling given: Not Answered   Clinical Intake:  Pre-visit preparation completed: Yes  Pain : No/denies pain     BMI - recorded: 28.46 Nutritional Status: BMI 25 -29 Overweight Nutritional Risks: None Diabetes: Yes CBG done?: No Did pt. bring in CBG monitor from home?: No  How often do you need to have someone help you when you read instructions, pamphlets, or other written materials from your doctor or pharmacy?: 1 - Never What is the last grade level you completed in school?: Associate's Degree  Diabetic? yes  Interpreter Needed?: No  Information entered by :: Lisette Abu, LPN   Activities of Daily Living In your present state of health, do you have any difficulty performing the following activities: 02/27/2020  Hearing? N  Vision? N  Difficulty concentrating or making decisions? N  Walking or climbing stairs? N  Dressing or bathing? N  Doing errands, shopping? N  Preparing Food and eating ? N  Using the  Toilet? N  In the past six months, have you accidently leaked urine? N  Do you have problems with loss of bowel control? N  Managing your Medications? N  Managing your Finances? N  Housekeeping or managing your Housekeeping? N  Some recent data might be hidden    Patient Care Team: Plotnikov, Evie Lacks, MD as PCP - General Darleen Crocker, MD as Consulting Physician (Ophthalmology)  Indicate any recent Medical Services you may have received from other than [facility] providers in the past year (date may be approximate).     Assessment:   This is a routine wellness examination for Douglas Richards.  Hearing/Vision screen No exam data present  Dietary issues and exercise activities discussed: Current Exercise Habits: The patient does not participate in regular exercise at present, Exercise limited by: orthopedic condition(s)  Goals       Continue to improve the mobility of  my shoulder, increase my exercise, monitor diet closer, and continue to see Dr. Harlen Labs      patient (pt-stated)      Wants to come off of his medications!!!       Patient Stated      I want to increase my physical activity by me making up my mind to be more active.      Patient Stated (pt-stated)      I would like to lose 10 pounds and increase my water intake to better my kidney function.       Depression Screen PHQ 2/9 Scores 02/27/2020 01/06/2019 11/29/2017 11/14/2016 09/29/2016 04/28/2015 06/30/2014  PHQ - 2 Score 0 0 0 0 0 0 0  PHQ- 9 Score - - - 0 - - -    Fall Risk Fall Risk  02/27/2020 01/06/2019 12/30/2018 11/29/2017 11/14/2016  Falls in the past year? 0 0 0 Yes No  Comment - - Emmi Telephone Survey: data to providers prior to load - -  Number falls in past yr: 0 0 - 1 -  Injury with Fall? 0 0 - No -  Risk for fall due to : No Fall Risks - - - -  Follow up Falls evaluation completed - - Falls prevention discussed -    FALL RISK PREVENTION PERTAINING TO THE  HOME:  Any stairs in or around the home? Yes  If so,  are there any without handrails? No  Home free of loose throw rugs in walkways, pet beds, electrical cords, etc? Yes  Adequate lighting in your home to reduce risk of falls? Yes   ASSISTIVE DEVICES UTILIZED TO PREVENT FALLS:  Life alert? No  Use of a cane, walker or w/c? No  Grab bars in the bathroom? Yes  Shower chair or bench in shower? No  Elevated toilet seat or a handicapped toilet? Yes   TIMED UP AND GO:  Was the test performed? No .  Length of time to ambulate 10 feet: 0 sec.   Gait steady and fast without use of assistive device  Cognitive Function: MMSE - Mini Mental State Exam 11/14/2016 04/28/2015  Not completed: - (No Data)  Orientation to time 5 -  Orientation to Place 5 -  Registration 3 -  Attention/ Calculation 5 -  Recall 3 -  Language- name 2 objects 2 -  Language- repeat 1 -  Language- follow 3 step command 3 -  Language- read & follow direction 1 -  Write a sentence 1 -  Copy design 1 -  Total score 30 -        Immunizations Immunization History  Administered Date(s) Administered   Fluad Quad(high Dose 65+) 10/18/2018, 10/22/2019   Influenza Whole 11/28/2005, 11/27/2008   Influenza, High Dose Seasonal PF 11/09/2015, 09/29/2016, 11/29/2017   Influenza,inj,Quad PF,6+ Mos 10/28/2013, 10/21/2014   Influenza-Unspecified 01/06/2013   PFIZER(Purple Top)SARS-COV-2 Vaccination 03/15/2019, 04/05/2019, 11/06/2019   Pneumococcal Conjugate-13 01/13/2014   Pneumococcal Polysaccharide-23 06/07/2011   Td 07/23/2009   Tdap 10/22/2019    TDAP status: Up to date  Flu Vaccine status: Up to date  Pneumococcal vaccine status: Up to date  Covid-19 vaccine status: Completed vaccines  Qualifies for Shingles Vaccine? Yes   Zostavax completed No   Shingrix Completed?: No.    Education has been provided regarding the importance of this vaccine. Patient has been advised to call insurance company to determine out of pocket expense if they have not yet received this  vaccine. Advised may also receive vaccine at local pharmacy or Health Dept. Verbalized acceptance and understanding.  Screening Tests Health Maintenance  Topic Date Due   OPHTHALMOLOGY EXAM  09/19/2018   FOOT EXAM  11/30/2018   COLONOSCOPY (Pts 45-41yrs Insurance coverage will need to be confirmed)  01/16/2019   HEMOGLOBIN A1C  08/16/2020   TETANUS/TDAP  10/21/2029   INFLUENZA VACCINE  Completed   COVID-19 Vaccine  Completed   Hepatitis C Screening  Completed   PNA vac Low Risk Adult  Completed    Health Maintenance  Health Maintenance Due  Topic Date Due   OPHTHALMOLOGY EXAM  09/19/2018   FOOT EXAM  11/30/2018   COLONOSCOPY (Pts 45-73yrs Insurance coverage will need to be confirmed)  01/16/2019    Colorectal cancer screening: Type of screening: Colonoscopy. Completed 01/15/14. Repeat every 5 years  (Scheduled for 03/30/2020)  Lung Cancer Screening: (Low Dose CT Chest recommended if Age 32-80 years, 30 pack-year currently smoking OR have quit w/in 15years.) does not qualify.   Lung Cancer Screening Referral: no  Additional Screening:  Hepatitis C Screening: does qualify; Completed yes  Vision Screening: Recommended annual ophthalmology exams for early detection of glaucoma and other disorders of the eye. Is the patient up to date with their annual eye exam?  Yes  Who is the provider or what is the name of  the office in which the patient attends annual eye exams? Darleen Crocker, MD If pt is not established with a provider, would they like to be referred to a provider to establish care? No .   Dental Screening: Recommended annual dental exams for proper oral hygiene  Community Resource Referral / Chronic Care Management: CRR required this visit?  No   CCM required this visit?  No      Plan:     I have personally reviewed and noted the following in the patient's chart:   Medical and social history Use of alcohol, tobacco or illicit drugs  Current medications and  supplements Functional ability and status Nutritional status Physical activity Advanced directives List of other physicians Hospitalizations, surgeries, and ER visits in previous 12 months Vitals Screenings to include cognitive, depression, and falls Referrals and appointments  In addition, I have reviewed and discussed with patient certain preventive protocols, quality metrics, and best practice recommendations. A written personalized care plan for preventive services as well as general preventive health recommendations were provided to patient.     Douglas Flow, LPN   [id number]   Nurse Notes:  Patient is cogitatively intact. There were no vitals filed for this visit. There is no height or weight on file to calculate BMI. Patient stated that he has no issues with gait or balance; does not use any assistive devices.  Medical screening examination/treatment/procedure(s) were performed by non-physician practitioner and as supervising physician I was immediately available for consultation/collaboration.  I agree with above. Lew Dawes, MD

## 2020-03-01 ENCOUNTER — Encounter: Payer: Self-pay | Admitting: Internal Medicine

## 2020-03-01 MED FILL — ALLOPURINOL 100 MG TABS: 100 | 90 days supply | Qty: 90 | Fill #0

## 2020-03-01 NOTE — Assessment & Plan Note (Signed)
In the view of his chronic renal failure, I would like to reduce allopurinol dose to 50 mg a day eventually.  He will need to discontinue aspirin.

## 2020-03-10 ENCOUNTER — Ambulatory Visit (AMBULATORY_SURGERY_CENTER): Payer: Self-pay | Admitting: *Deleted

## 2020-03-10 ENCOUNTER — Other Ambulatory Visit: Payer: Self-pay

## 2020-03-10 ENCOUNTER — Other Ambulatory Visit: Payer: Self-pay | Admitting: Internal Medicine

## 2020-03-10 VITALS — Ht 68.0 in | Wt 189.0 lb

## 2020-03-10 DIAGNOSIS — Z86010 Personal history of colonic polyps: Secondary | ICD-10-CM

## 2020-03-10 MED ORDER — SUTAB 1479-225-188 MG PO TABS: 1.0000 | ORAL_TABLET | ORAL | 0 refills | Status: DC

## 2020-03-10 MED FILL — SUTAB 1479-225-188 MG TABS: 1479-225-18 | 1 days supply | Qty: 24 | Fill #0

## 2020-03-10 NOTE — Progress Notes (Signed)
Patient is here in-person for PV. Patient denies any allergies to eggs or soy. Patient denies any problems with anesthesia/sedation. Patient denies any oxygen use at home. Patient denies taking any diet/weight loss medications or blood thinners. Patient is not being treated for MRSA or C-diff. Patient is aware of our care-partner policy and [id number] safety protocol.  COVID-19 vaccines completed on 11/06/19 x3, per patient.   Medicare Prep Prescription coupon given to the patient.

## 2020-03-26 ENCOUNTER — Other Ambulatory Visit: Payer: Self-pay | Admitting: Internal Medicine

## 2020-03-26 MED FILL — METHOCARBAMOL 500 MG TABS: 500 | 10 days supply | Qty: 30 | Fill #0

## 2020-03-30 ENCOUNTER — Other Ambulatory Visit: Payer: Self-pay

## 2020-03-30 ENCOUNTER — Encounter: Payer: Self-pay | Admitting: Internal Medicine

## 2020-03-30 ENCOUNTER — Ambulatory Visit (AMBULATORY_SURGERY_CENTER): Payer: PPO | Admitting: Internal Medicine

## 2020-03-30 VITALS — BP 107/68 | HR 60 | Temp 98.6°F | Resp 16 | Ht 68.0 in | Wt 189.0 lb

## 2020-03-30 DIAGNOSIS — Z86010 Personal history of colonic polyps: Secondary | ICD-10-CM

## 2020-03-30 DIAGNOSIS — D124 Benign neoplasm of descending colon: Secondary | ICD-10-CM | POA: Diagnosis not present

## 2020-03-30 DIAGNOSIS — D122 Benign neoplasm of ascending colon: Secondary | ICD-10-CM | POA: Diagnosis not present

## 2020-03-30 DIAGNOSIS — Z1211 Encounter for screening for malignant neoplasm of colon: Secondary | ICD-10-CM | POA: Diagnosis not present

## 2020-03-30 MED ORDER — SODIUM CHLORIDE 0.9 % IV SOLN: 500.0000 mL | Freq: Once | INTRAVENOUS | Status: DC

## 2020-03-30 NOTE — Patient Instructions (Signed)
Handout given on polyps  YOU HAD AN ENDOSCOPIC PROCEDURE TODAY: Refer to the procedure report and other information in the discharge instructions given to you for any specific questions about what was found during the examination. If this information does not answer your questions, please call [place] office at [phone] to clarify.   YOU SHOULD EXPECT: Some feelings of bloating in the abdomen. Passage of more gas than usual. Walking can help get rid of the air that was put into your GI tract during the procedure and reduce the bloating. If you had a lower endoscopy (such as a colonoscopy or flexible sigmoidoscopy) you may notice spotting of blood in your stool or on the toilet paper. Some abdominal soreness may be present for a day or two, also.  DIET: Your first meal following the procedure should be a light meal and then it is ok to progress to your normal diet. A half-sandwich or bowl of soup is an example of a good first meal. Heavy or fried foods are harder to digest and may make you feel nauseous or bloated. Drink plenty of fluids but you should avoid alcoholic beverages for 24 hours. If you had a esophageal dilation, please see attached instructions for diet.    ACTIVITY: Your care partner should take you home directly after the procedure. You should plan to take it easy, moving slowly for the rest of the day. You can resume normal activity the day after the procedure however YOU SHOULD NOT DRIVE, use power tools, machinery or perform tasks that involve climbing or major physical exertion for 24 hours (because of the sedation medicines used during the test).   SYMPTOMS TO REPORT IMMEDIATELY: A gastroenterologist can be reached at any hour. Please call [phone]  for any of the following symptoms:  Following lower endoscopy (colonoscopy, flexible sigmoidoscopy) Excessive amounts of blood in the stool  Significant tenderness, worsening of abdominal pains  Swelling of the abdomen that is  new, acute  Fever of 100 or higher    FOLLOW UP:  If any biopsies were taken you will be contacted by phone or by letter within the next 1-3 weeks. Call [phone]  if you have not heard about the biopsies in 3 weeks.  Please also call with any specific questions about appointments or follow up tests.  

## 2020-03-30 NOTE — Progress Notes (Signed)
PT taken to PACU. Monitors in place. VSS. Report given to RN. 

## 2020-03-30 NOTE — Progress Notes (Signed)
Called to room to assist during endoscopic procedure.  Patient ID and intended procedure confirmed with present staff. Received instructions for my participation in the procedure from the performing physician.  

## 2020-03-30 NOTE — Progress Notes (Signed)
VS by CW.  previsit with RM.  No changes to health hx since previsit

## 2020-03-30 NOTE — Op Note (Signed)
[facility] Patient Name: Douglas Richards Procedure Date: 03/30/2020 11:56 AM MRN: [id number] Endoscopist: Docia Chuck. Henrene Pastor , MD Age: 75 Referring MD:  Date of Birth: 1945/05/13 Gender: Male Account #: [id number] Procedure:                Colonoscopy with cold snare polypectomy x 3 Indications:              High risk colon cancer surveillance: Personal                            history of adenoma (10 mm or greater in size), High                            risk colon cancer surveillance: Personal history of                            multiple (3 or more) adenomas. Previous                            examinations 2000, 2001, 2002, 2004, 2010, 2015 Medicines:                Monitored Anesthesia Care Procedure:                Pre-Anesthesia Assessment:                           - Prior to the procedure, a History and Physical                            was performed, and patient medications and                            allergies were reviewed. The patient's tolerance of                            previous anesthesia was also reviewed. The risks                            and benefits of the procedure and the sedation                            options and risks were discussed with the patient.                            All questions were answered, and informed consent                            was obtained. Prior Anticoagulants: The patient has                            taken no previous anticoagulant or antiplatelet                            agents. After reviewing the risks and benefits, the  patient was deemed in satisfactory condition to                            undergo the procedure.                           After obtaining informed consent, the colonoscope                            was passed under direct vision. Throughout the                            procedure, the patient's blood pressure, pulse, and                            oxygen  saturations were monitored continuously. The                            Olympus CF-HQ190L (Serial# [id number]) Colonoscope was                            introduced through the anus and advanced to the the                            cecum, identified by appendiceal orifice and                            ileocecal valve. The ileocecal valve, appendiceal                            orifice, and rectum were photographed. The quality                            of the bowel preparation was fair. The colonoscopy                            was performed without difficulty. The patient                            tolerated the procedure well. The bowel preparation                            used was SUPREP/TABLETS via split dose instruction. Scope In: 12:07:27 PM Scope Out: 12:28:28 PM Scope Withdrawal Time: 0 hours 18 minutes 32 seconds  Total Procedure Duration: 0 hours 21 minutes 1 second  Findings:                 Three sessile polyps were found in the descending                            colon and ascending colon. The polyps were 5 to 7                            mm in size. These polyps were removed  with a cold                            snare. Resection and retrieval were complete.                           Multiple diverticula were found in the left colon.                           Internal hemorrhoids were found during                            retroflexion. Hypertrophic anal papilla                           The exam was otherwise without abnormality on                            direct and retroflexion views. Examination                            compromised by prep quality. Oily effluent. Complications:            No immediate complications. Estimated blood loss:                            None. Estimated Blood Loss:     Estimated blood loss: none. Impression:               - Preparation of the colon was fair. Oily effluent                           - Three 5 to 7 mm polyps in the  descending colon                            and in the ascending colon, removed with a cold                            snare. Resected and retrieved.                           - Diverticulosis in the left colon.                           - Internal hemorrhoids.                           - The examination was otherwise normal on direct                            and retroflexion views. Recommendation:           - Repeat colonoscopy within 3 months for                            surveillance. Split prep. No Sutabs. My office will  provide you with a alternative prep. Avoid any                            liquids with oil or grease the day prior to your                            procedure.                           - Patient has a contact number available for                            emergencies. The signs and symptoms of potential                            delayed complications were discussed with the                            patient. Return to normal activities tomorrow.                            Written discharge instructions were provided to the                            patient.                           - Resume previous diet.                           - Continue present medications.                           - Await pathology results. Docia Chuck. Henrene Pastor, MD 03/30/2020 12:37:48 PM This report has been signed electronically.

## 2020-04-01 ENCOUNTER — Telehealth: Payer: Self-pay | Admitting: *Deleted

## 2020-04-01 NOTE — Telephone Encounter (Signed)
  Follow up Call-  Call back number 03/30/2020  Post procedure Call Back phone  # [phone]  Permission to leave phone message Yes  Some recent data might be hidden     Patient questions:  Do you have a fever, pain , or abdominal swelling? No. Pain Score  0 *  Have you tolerated food without any problems? Yes.    Have you been able to return to your normal activities? Yes.    Do you have any questions about your discharge instructions: Diet   No. Medications  No. Follow up visit  No.  Do you have questions or concerns about your Care? No.  Actions: * If pain score is 4 or above: No action needed, pain <4.  1. Have you developed a fever since your procedure? no  2.   Have you had an respiratory symptoms (SOB or cough) since your procedure? no  3.   Have you tested positive for COVID 19 since your procedure no  4.   Have you had any family members/close contacts diagnosed with the COVID 19 since your procedure?  no   If yes to any of these questions please route to Joylene John, RN and Joella Prince, RN

## 2020-04-01 NOTE — Telephone Encounter (Signed)
No answer for post procedure call back. Unable to leave message. Will call back.

## 2020-04-07 ENCOUNTER — Encounter: Payer: Self-pay | Admitting: Internal Medicine

## 2020-04-11 MED FILL — AMLODIPINE BESYLATE 5 MG TA: 5 | 90 days supply | Qty: 90 | Fill #1

## 2020-04-11 MED FILL — TRANDOLAPRIL 4 MG TABLET: 4 | 90 days supply | Qty: 90 | Fill #1

## 2020-04-12 MED FILL — REPAGLINIDE 1 MG TABLET: 1 | 90 days supply | Qty: 270 | Fill #2

## 2020-04-30 ENCOUNTER — Other Ambulatory Visit (HOSPITAL_BASED_OUTPATIENT_CLINIC_OR_DEPARTMENT_OTHER): Payer: Self-pay

## 2020-05-09 ENCOUNTER — Other Ambulatory Visit (HOSPITAL_COMMUNITY): Payer: Self-pay

## 2020-05-09 MED FILL — Tamsulosin HCl Cap 0.4 MG: ORAL | 90 days supply | Qty: 90 | Fill #0 | Status: AC

## 2020-05-09 MED FILL — Omeprazole Cap Delayed Release 20 MG: ORAL | 90 days supply | Qty: 90 | Fill #0 | Status: AC

## 2020-05-16 MED FILL — Metformin HCl Tab 500 MG: ORAL | 90 days supply | Qty: 180 | Fill #0 | Status: AC

## 2020-05-17 ENCOUNTER — Other Ambulatory Visit (HOSPITAL_COMMUNITY): Payer: Self-pay

## 2020-05-18 ENCOUNTER — Other Ambulatory Visit (INDEPENDENT_AMBULATORY_CARE_PROVIDER_SITE_OTHER): Payer: PPO

## 2020-05-18 DIAGNOSIS — E118 Type 2 diabetes mellitus with unspecified complications: Secondary | ICD-10-CM | POA: Diagnosis not present

## 2020-05-18 LAB — BASIC METABOLIC PANEL
BUN: 28 mg/dL — ABNORMAL HIGH (ref 6–23)
CO2: 22 mEq/L (ref 19–32)
Calcium: 9.1 mg/dL (ref 8.4–10.5)
Chloride: 98 mEq/L (ref 96–112)
Creatinine, Ser: 1.77 mg/dL — ABNORMAL HIGH (ref 0.40–1.50)
GFR: 37.33 mL/min — ABNORMAL LOW (ref >60.00)
Glucose, Bld: 103 mg/dL — ABNORMAL HIGH (ref 70–99)
Potassium: 4.2 mEq/L (ref 3.5–5.1)
Sodium: 132 mEq/L — ABNORMAL LOW (ref 135–145)

## 2020-05-18 LAB — HEMOGLOBIN A1C: Hgb A1c MFr Bld: 6.5 % (ref 4.6–6.5)

## 2020-05-23 MED FILL — Allopurinol Tab 100 MG: ORAL | 90 days supply | Qty: 90 | Fill #0 | Status: AC

## 2020-05-24 ENCOUNTER — Other Ambulatory Visit (HOSPITAL_COMMUNITY): Payer: Self-pay

## 2020-05-25 ENCOUNTER — Other Ambulatory Visit: Payer: Self-pay

## 2020-05-26 ENCOUNTER — Encounter: Payer: Self-pay | Admitting: Internal Medicine

## 2020-05-26 ENCOUNTER — Ambulatory Visit (INDEPENDENT_AMBULATORY_CARE_PROVIDER_SITE_OTHER): Payer: PPO | Admitting: Internal Medicine

## 2020-05-26 ENCOUNTER — Other Ambulatory Visit (HOSPITAL_COMMUNITY): Payer: Self-pay

## 2020-05-26 ENCOUNTER — Other Ambulatory Visit: Payer: Self-pay

## 2020-05-26 DIAGNOSIS — E1122 Type 2 diabetes mellitus with diabetic chronic kidney disease: Secondary | ICD-10-CM | POA: Diagnosis not present

## 2020-05-26 DIAGNOSIS — I10 Essential (primary) hypertension: Secondary | ICD-10-CM

## 2020-05-26 DIAGNOSIS — M545 Low back pain, unspecified: Secondary | ICD-10-CM | POA: Diagnosis not present

## 2020-05-26 DIAGNOSIS — G8929 Other chronic pain: Secondary | ICD-10-CM

## 2020-05-26 DIAGNOSIS — N182 Chronic kidney disease, stage 2 (mild): Secondary | ICD-10-CM

## 2020-05-26 DIAGNOSIS — N183 Chronic kidney disease, stage 3 unspecified: Secondary | ICD-10-CM

## 2020-05-26 MED ORDER — FREESTYLE LANCETS MISC
5 refills | Status: DC
  Filled 2020-05-26: qty 100, 50d supply, fill #0

## 2020-05-26 NOTE — Assessment & Plan Note (Signed)
Amlodipine Mavik cardiac CT scan for calcium scoring

## 2020-05-26 NOTE — Assessment & Plan Note (Addendum)
Better Norco - rare

## 2020-05-26 NOTE — Assessment & Plan Note (Addendum)
Monitor GFR Avoid NSAIDs Nephrology ref

## 2020-05-26 NOTE — Assessment & Plan Note (Signed)
On Metformin 500 mg bid  Prandin 2019

## 2020-05-26 NOTE — Progress Notes (Signed)
Subjective:  Patient ID: Douglas Richards, male    DOB: 10-31-1945  Age: 75 y.o. MRN: [id number]  CC: 3 month f/u (Patient stated that he is still dealing with the back pain. Otherwise he feels fine. )   HPI Douglas Richards presents for DM, HTN, dyslipidemia f/u. LBP is better  Outpatient Medications Prior to Visit  Medication Sig Dispense Refill  . allopurinol (ZYLOPRIM) 100 MG tablet TAKE AS DIRECTED ONCE A DAY 90 tablet 3  . amLODipine (NORVASC) 5 MG tablet TAKE 1 TABLET BY MOUTH DAILY. 90 tablet 2  . Cholecalciferol (VITAMIN D3) 1000 UNITS tablet Take 1,000 Units by mouth daily.    . Glucosamine-Chondroitin 750-600 MG TABS Take by mouth 2 (two) times daily. Reported on 04/28/2015    . glucose blood (FREESTYLE LITE) test strip USE TO CHECK BLOOD SUGAR TWICE DAILY 100 strip 5  . HYDROcodone-acetaminophen (NORCO/VICODIN) 5-325 MG tablet TAKE 1 TABLET BY MOUTH EVERY 6 HOURS AS NEEDED FOR SEVERE PAIN 60 tablet 0  . Lancets (FREESTYLE) lancets USE TO CHECK BLOOD SUGAR TWICE DAILY. Annual appt is due w/lab must see provider for future refills 100 each 0  . loratadine (CLARITIN) 10 MG tablet Take 10 mg by mouth daily.    . metFORMIN (GLUCOPHAGE) 500 MG tablet TAKE 1 TABLET BY MOUTH TWO TIMES DAILY WITH MEALS 180 tablet 3  . methocarbamol (ROBAXIN) 500 MG tablet TAKE 1 TABLET BY MOUTH EVERY 8 HOURS AS NEEDED 30 tablet 0  . omeprazole (PRILOSEC) 20 MG capsule TAKE 1 CAPSULE BY MOUTH ONCE DAILY 90 capsule 3  . repaglinide (PRANDIN) 1 MG tablet TAKE 1 TABLET BY MOUTH THREE TIMES DAILY BEFORE MEALS. 270 tablet 3  . rosuvastatin (CRESTOR) 10 MG tablet TAKE 1 TABLET BY MOUTH ONCE A DAY **DUE FOR ANNUAL APPOINTMENT** 90 tablet 3  . Saw Palmetto, Serenoa repens, (SAW PALMETTO PO) Take by mouth.    . tamsulosin (FLOMAX) 0.4 MG CAPS capsule TAKE 1 CAPSULE BY MOUTH ONCE DAILY 90 capsule 3  . thiamine (VITAMIN B-1) 100 MG tablet Take 100 mg by mouth daily.    . trandolapril (MAVIK) 4 MG tablet TAKE 1 TABLET  BY MOUTH ONCE DAILY 90 tablet 1  . HYDROCORTISONE ACE, RECTAL, (HEMRIL-30) 30 MG SUPP Place 1 suppository (30 mg total) rectally 2 (two) times daily. 60 each 1   No facility-administered medications prior to visit.    ROS: Review of Systems  Constitutional: Negative for appetite change, fatigue and unexpected weight change.  HENT: Negative for congestion, nosebleeds, sneezing, sore throat and trouble swallowing.   Eyes: Negative for itching and visual disturbance.  Respiratory: Negative for cough.   Cardiovascular: Negative for chest pain, palpitations and leg swelling.  Gastrointestinal: Negative for abdominal distention, blood in stool, diarrhea and nausea.  Genitourinary: Negative for frequency and hematuria.  Musculoskeletal: Positive for back pain. Negative for gait problem, joint swelling and neck pain.  Skin: Negative for rash.  Neurological: Negative for dizziness, tremors, speech difficulty and weakness.  Psychiatric/Behavioral: Negative for agitation, dysphoric mood and sleep disturbance. The patient is not nervous/anxious.     Objective:  BP 124/80   Pulse 91   Temp 98 F (36.7 C) (Oral)   Resp 18   Ht 5\' 8"  (1.727 m)   Wt 187 lb 9.6 oz (85.1 kg)   SpO2 99%   BMI 28.52 kg/m   BP Readings from Last 3 Encounters:  05/26/20 124/80  03/30/20 107/68  02/26/20 140/82    Wt  Readings from Last 3 Encounters:  05/26/20 187 lb 9.6 oz (85.1 kg)  03/30/20 189 lb (85.7 kg)  03/10/20 189 lb (85.7 kg)    Physical Exam Constitutional:      General: He is not in acute distress.    Appearance: He is well-developed.     Comments: NAD  Eyes:     Conjunctiva/sclera: Conjunctivae normal.     Pupils: Pupils are equal, round, and reactive to light.  Neck:     Thyroid: No thyromegaly.     Vascular: No JVD.  Cardiovascular:     Rate and Rhythm: Normal rate and regular rhythm.     Heart sounds: Normal heart sounds. No murmur heard. No friction rub. No gallop.   Pulmonary:      Effort: Pulmonary effort is normal. No respiratory distress.     Breath sounds: Normal breath sounds. No wheezing or rales.  Chest:     Chest wall: No tenderness.  Abdominal:     General: Bowel sounds are normal. There is no distension.     Palpations: Abdomen is soft. There is no mass.     Tenderness: There is no abdominal tenderness. There is no guarding or rebound.  Musculoskeletal:        General: No tenderness. Normal range of motion.     Cervical back: Normal range of motion.  Lymphadenopathy:     Cervical: No cervical adenopathy.  Skin:    General: Skin is warm and dry.     Findings: No rash.  Neurological:     Mental Status: He is alert and oriented to person, place, and time.     Cranial Nerves: No cranial nerve deficit.     Motor: No abnormal muscle tone.     Coordination: Coordination normal.     Gait: Gait normal.     Deep Tendon Reflexes: Reflexes are normal and symmetric.  Psychiatric:        Behavior: Behavior normal.        Thought Content: Thought content normal.        Judgment: Judgment normal.     Lab Results  Component Value Date   WBC 6.0 09/17/2015   HGB 13.5 09/17/2015   HCT 40.5 09/17/2015   PLT 151.0 09/17/2015   GLUCOSE 103 (H) 05/18/2020   CHOL 122 09/17/2015   TRIG 150.0 (H) 09/17/2015   HDL 40.30 09/17/2015   LDLDIRECT 105.5 09/10/2013   LDLCALC 51 09/17/2015   ALT 13 09/17/2015   AST 15 09/17/2015   NA 132 (L) 05/18/2020   K 4.2 05/18/2020   CL 98 05/18/2020   CREATININE 1.77 (H) 05/18/2020   BUN 28 (H) 05/18/2020   CO2 22 05/18/2020   TSH 2.65 09/17/2015   PSA 0.81 09/17/2015   HGBA1C 6.5 05/18/2020   MICROALBUR 0.4 03/31/2010    No results found.  Assessment & Plan:

## 2020-05-27 ENCOUNTER — Other Ambulatory Visit (HOSPITAL_COMMUNITY): Payer: Self-pay

## 2020-05-27 ENCOUNTER — Encounter: Payer: Self-pay | Admitting: Internal Medicine

## 2020-06-07 ENCOUNTER — Other Ambulatory Visit (HOSPITAL_COMMUNITY): Payer: Self-pay

## 2020-06-07 ENCOUNTER — Other Ambulatory Visit: Payer: Self-pay | Admitting: Internal Medicine

## 2020-06-08 ENCOUNTER — Other Ambulatory Visit (HOSPITAL_COMMUNITY): Payer: Self-pay

## 2020-06-08 MED ORDER — METHOCARBAMOL 500 MG PO TABS
ORAL_TABLET | Freq: Three times a day (TID) | ORAL | 1 refills | Status: DC | PRN
  Filled 2020-06-08: qty 30, 10d supply, fill #0

## 2020-07-12 ENCOUNTER — Other Ambulatory Visit: Payer: Self-pay | Admitting: Internal Medicine

## 2020-07-12 ENCOUNTER — Other Ambulatory Visit (HOSPITAL_COMMUNITY): Payer: Self-pay

## 2020-07-12 MED ORDER — TRANDOLAPRIL 4 MG PO TABS
ORAL_TABLET | Freq: Every day | ORAL | 3 refills | Status: DC
  Filled 2020-07-12: qty 90, 90d supply, fill #0
  Filled 2020-10-10: qty 90, 90d supply, fill #1
  Filled 2021-01-09: qty 90, 90d supply, fill #2
  Filled 2021-04-03: qty 90, 90d supply, fill #3

## 2020-07-12 MED FILL — Amlodipine Besylate Tab 5 MG (Base Equivalent): ORAL | 90 days supply | Qty: 90 | Fill #0 | Status: AC

## 2020-07-13 ENCOUNTER — Other Ambulatory Visit (HOSPITAL_COMMUNITY): Payer: Self-pay

## 2020-07-20 ENCOUNTER — Other Ambulatory Visit (HOSPITAL_COMMUNITY): Payer: Self-pay

## 2020-07-20 MED FILL — Repaglinide Tab 1 MG: ORAL | 90 days supply | Qty: 270 | Fill #0 | Status: CN

## 2020-07-20 MED FILL — Rosuvastatin Calcium Tab 10 MG: ORAL | 90 days supply | Qty: 90 | Fill #0 | Status: AC

## 2020-07-21 ENCOUNTER — Other Ambulatory Visit (HOSPITAL_COMMUNITY): Payer: Self-pay

## 2020-07-21 MED FILL — Repaglinide Tab 1 MG: ORAL | 90 days supply | Qty: 270 | Fill #0 | Status: AC

## 2020-07-22 ENCOUNTER — Ambulatory Visit: Payer: PPO

## 2020-07-22 ENCOUNTER — Other Ambulatory Visit (HOSPITAL_COMMUNITY): Payer: Self-pay

## 2020-07-22 ENCOUNTER — Other Ambulatory Visit: Payer: Self-pay

## 2020-07-22 VITALS — Ht 68.0 in | Wt 189.0 lb

## 2020-07-22 DIAGNOSIS — Z86010 Personal history of colonic polyps: Secondary | ICD-10-CM

## 2020-07-22 MED ORDER — NA SULFATE-K SULFATE-MG SULF 17.5-3.13-1.6 GM/177ML PO SOLN
1.0000 | Freq: Once | ORAL | 0 refills | Status: AC
  Filled 2020-07-22: qty 354, 1d supply, fill #0

## 2020-07-22 NOTE — Progress Notes (Signed)
No allergies to soy or egg Pt is not on blood thinners or diet pills Denies issues with sedation/intubation Denies atrial flutter/fib Denies constipation   Emmi instructions given to pt  Pt is aware of Covid safety and care partner requirements.   Added to instruction no metamucil, fiber one, times 5 days  No oil or grease the day before procedure.

## 2020-07-23 ENCOUNTER — Other Ambulatory Visit (HOSPITAL_COMMUNITY): Payer: Self-pay

## 2020-08-04 ENCOUNTER — Ambulatory Visit (AMBULATORY_SURGERY_CENTER): Payer: PPO | Admitting: Internal Medicine

## 2020-08-04 ENCOUNTER — Other Ambulatory Visit: Payer: Self-pay

## 2020-08-04 ENCOUNTER — Encounter: Payer: Self-pay | Admitting: Internal Medicine

## 2020-08-04 VITALS — BP 99/64 | HR 70 | Temp 98.9°F | Resp 9 | Ht 68.0 in | Wt 189.0 lb

## 2020-08-04 DIAGNOSIS — D125 Benign neoplasm of sigmoid colon: Secondary | ICD-10-CM | POA: Diagnosis not present

## 2020-08-04 DIAGNOSIS — D122 Benign neoplasm of ascending colon: Secondary | ICD-10-CM

## 2020-08-04 DIAGNOSIS — D123 Benign neoplasm of transverse colon: Secondary | ICD-10-CM | POA: Diagnosis not present

## 2020-08-04 DIAGNOSIS — Z86010 Personal history of colonic polyps: Secondary | ICD-10-CM

## 2020-08-04 DIAGNOSIS — D124 Benign neoplasm of descending colon: Secondary | ICD-10-CM

## 2020-08-04 DIAGNOSIS — Z1211 Encounter for screening for malignant neoplasm of colon: Secondary | ICD-10-CM | POA: Diagnosis not present

## 2020-08-04 MED ORDER — SODIUM CHLORIDE 0.9 % IV SOLN: 500.0000 mL | Freq: Once | INTRAVENOUS | Status: DC

## 2020-08-04 NOTE — Progress Notes (Signed)
Vital signs by [place].  Pt's states no medical or surgical changes since previsit or office visit.

## 2020-08-04 NOTE — Op Note (Signed)
[facility] Patient Name: Douglas Richards Procedure Date: 08/04/2020 1:17 PM MRN: [id number] Endoscopist: Docia Chuck. Henrene Pastor , MD Age: 75 Referring MD:  Date of Birth: Feb 20, 1945 Gender: Male Account #: [id number] Procedure:                Colonoscopy with cold snare polypectomy x 11; with                            biopsies Indications:              High risk colon cancer surveillance: Personal                            history of adenoma (10 mm or greater in size), High                            risk colon cancer surveillance: Personal history of                            multiple (3 or more) adenomas. Previous                            examinations 2000, 2001, 2002, 2004, 2010, 2015,                            February 2022 (3 polyps fair prep) Medicines:                Monitored Anesthesia Care Procedure:                Pre-Anesthesia Assessment:                           - Prior to the procedure, a History and Physical                            was performed, and patient medications and                            allergies were reviewed. The patient's tolerance of                            previous anesthesia was also reviewed. The risks                            and benefits of the procedure and the sedation                            options and risks were discussed with the patient.                            All questions were answered, and informed consent                            was obtained. Prior Anticoagulants: The patient has  taken no previous anticoagulant or antiplatelet                            agents. ASA Grade Assessment: II - A patient with                            mild systemic disease. After reviewing the risks                            and benefits, the patient was deemed in                            satisfactory condition to undergo the procedure.                           After obtaining informed consent, the  colonoscope                            was passed under direct vision. Throughout the                            procedure, the patient's blood pressure, pulse, and                            oxygen saturations were monitored continuously. The                            Olympus CF-HQ190L (Serial# [id number]) Colonoscope was                            introduced through the anus and advanced to the the                            cecum, identified by appendiceal orifice and                            ileocecal valve. The ileocecal valve, appendiceal                            orifice, and rectum were photographed. The quality                            of the bowel preparation was good. The colonoscopy                            was performed without difficulty. The patient                            tolerated the procedure well. The bowel preparation                            used was SUPREP via split dose instruction. Scope In: 1:33:31 PM Scope Out: 2:10:04 PM Scope Withdrawal Time: 0 hours 29 minutes 41 seconds  Total Procedure Duration: 0 hours 36 minutes 33 seconds  Findings:                 11 polyps were found in the sigmoid colon,                            descending colon, transverse colon and ascending                            colon. The polyps were 2 to 5 mm in size. These                            polyps were removed with a cold snare. Resection                            and retrieval were complete.                           Two 1 mm polyps were found in the descending colon.                            The polyps were removed with a cold snare.                            Resection and retrieval were complete.                           Multiple diverticula were found in the left colon.                           Internal hemorrhoids were found during                            retroflexion. Hypertrophic anal papilla.                           The exam was otherwise without  abnormality on                            direct and retroflexion views. Complications:            No immediate complications. Estimated blood loss:                            None. Estimated Blood Loss:     Estimated blood loss: none. Impression:               - Ten 2 to 5 mm polyps in the sigmoid colon, in the                            descending colon, in the transverse colon and in                            the ascending colon, removed with a cold snare.  Resected and retrieved.                           - One 1 mm polyp in the descending colon, removed                            with a cold snare. Resected and retrieved.                           - Diverticulosis in the left colon.                           - Internal hemorrhoids.                           - The examination was otherwise normal on direct                            and retroflexion views. Recommendation:           - Repeat colonoscopy in 1 year for surveillance.                            Pay close attention to preprocedure dietary                            restrictions.                           - Patient has a contact number available for                            emergencies. The signs and symptoms of potential                            delayed complications were discussed with the                            patient. Return to normal activities tomorrow.                            Written discharge instructions were provided to the                            patient.                           - Resume previous diet.                           - Continue present medications.                           - Await pathology results. Docia Chuck. Henrene Pastor, MD 08/04/2020 2:26:31 PM This report has been signed electronically.

## 2020-08-04 NOTE — Progress Notes (Signed)
Vitals by [place]. Pt's states no medical or surgical changes since previsit or office visit.  

## 2020-08-04 NOTE — Progress Notes (Signed)
A/ox3, pleased with MAC, report to RN 

## 2020-08-04 NOTE — Progress Notes (Signed)
Called to room to assist during endoscopic procedure.  Patient ID and intended procedure confirmed with present staff. Received instructions for my participation in the procedure from the performing physician.  

## 2020-08-04 NOTE — Patient Instructions (Signed)
YOU HAD AN ENDOSCOPIC PROCEDURE TODAY AT THE [facility]:   Refer to the procedure report that was given to you for any specific questions about what was found during the examination.  If the procedure report does not answer your questions, please call your gastroenterologist to clarify.  If you requested that your care partner not be given the details of your procedure findings, then the procedure report has been included in a sealed envelope for you to review at your convenience later.  YOU SHOULD EXPECT: Some feelings of bloating in the abdomen. Passage of more gas than usual.  Walking can help get rid of the air that was put into your GI tract during the procedure and reduce the bloating. If you had a lower endoscopy (such as a colonoscopy or flexible sigmoidoscopy) you may notice spotting of blood in your stool or on the toilet paper. If you underwent a bowel prep for your procedure, you may not have a normal bowel movement for a few days.  Please Note:  You might notice some irritation and congestion in your nose or some drainage.  This is from the oxygen used during your procedure.  There is no need for concern and it should clear up in a day or so.  SYMPTOMS TO REPORT IMMEDIATELY:   Following lower endoscopy (colonoscopy or flexible sigmoidoscopy):  Excessive amounts of blood in the stool  Significant tenderness or worsening of abdominal pains  Swelling of the abdomen that is new, acute  Fever of 100F or higher   Following upper endoscopy (EGD)  Vomiting of blood or coffee ground material  New chest pain or pain under the shoulder blades  Painful or persistently difficult swallowing  New shortness of breath  Fever of 100F or higher  Black, tarry-looking stools  For urgent or emergent issues, a gastroenterologist can be reached at any hour by calling [phone]. Do not use MyChart messaging for urgent concerns.    DIET:  We do recommend a small meal at first, but  then you may proceed to your regular diet.  Drink plenty of fluids but you should avoid alcoholic beverages for 24 hours.  ACTIVITY:  You should plan to take it easy for the rest of today and you should NOT DRIVE or use heavy machinery until tomorrow (because of the sedation medicines used during the test).    FOLLOW UP: Our staff will call the number listed on your records 48-72 hours following your procedure to check on you and address any questions or concerns that you may have regarding the information given to you following your procedure. If we do not reach you, we will leave a message.  We will attempt to reach you two times.  During this call, we will ask if you have developed any symptoms of COVID 19. If you develop any symptoms (ie: fever, flu-like symptoms, shortness of breath, cough etc.) before then, please call [phone].  If you test positive for Covid 19 in the 2 weeks post procedure, please call and report this information to us.    If any biopsies were taken you will be contacted by phone or by letter within the next 1-3 weeks.  Please call us at [phone] if you have not heard about the biopsies in 3 weeks.    SIGNATURES/CONFIDENTIALITY: You and/or your care partner have signed paperwork which will be entered into your electronic medical record.  These signatures attest to the fact that that the information above on   your After Visit Summary has been reviewed and is understood.  Full responsibility of the confidentiality of this discharge information lies with you and/or your care-partner. 

## 2020-08-06 ENCOUNTER — Telehealth: Payer: Self-pay | Admitting: *Deleted

## 2020-08-06 NOTE — Telephone Encounter (Signed)
  Follow up Call-  Call back number 08/04/2020 03/30/2020  Post procedure Call Back phone  # [phone]  Permission to leave phone message Yes Yes  Some recent data might be hidden     Patient questions:  Do you have a fever, pain , or abdominal swelling? No. Pain Score  0 *  Have you tolerated food without any problems? Yes.    Have you been able to return to your normal activities? Yes.    Do you have any questions about your discharge instructions: Diet   No. Medications  No. Follow up visit  No.  Do you have questions or concerns about your Care? No.  Actions: * If pain score is 4 or above: No action needed, pain <4.  Have you developed a fever since your procedure? no  2.   Have you had an respiratory symptoms (SOB or cough) since your procedure? no  3.   Have you tested positive for COVID 19 since your procedure no  4.   Have you had any family members/close contacts diagnosed with the COVID 19 since your procedure?  no   If yes to any of these questions please route to Joylene John, RN and Joella Prince, RN

## 2020-08-08 ENCOUNTER — Other Ambulatory Visit: Payer: Self-pay | Admitting: Internal Medicine

## 2020-08-09 ENCOUNTER — Other Ambulatory Visit (HOSPITAL_COMMUNITY): Payer: Self-pay

## 2020-08-10 ENCOUNTER — Other Ambulatory Visit (HOSPITAL_COMMUNITY): Payer: Self-pay

## 2020-08-10 ENCOUNTER — Encounter: Payer: Self-pay | Admitting: Internal Medicine

## 2020-08-10 MED ORDER — METFORMIN HCL 500 MG PO TABS
ORAL_TABLET | Freq: Two times a day (BID) | ORAL | 3 refills | Status: DC
  Filled 2020-08-10: qty 180, 90d supply, fill #0
  Filled 2020-11-07: qty 180, 90d supply, fill #1
  Filled 2021-02-03: qty 180, 90d supply, fill #2
  Filled 2021-04-24: qty 180, 90d supply, fill #3

## 2020-08-15 MED FILL — Allopurinol Tab 100 MG: ORAL | 90 days supply | Qty: 90 | Fill #1 | Status: AC

## 2020-08-16 ENCOUNTER — Other Ambulatory Visit (HOSPITAL_COMMUNITY): Payer: Self-pay

## 2020-08-18 ENCOUNTER — Other Ambulatory Visit (INDEPENDENT_AMBULATORY_CARE_PROVIDER_SITE_OTHER): Payer: PPO

## 2020-08-18 DIAGNOSIS — N182 Chronic kidney disease, stage 2 (mild): Secondary | ICD-10-CM | POA: Diagnosis not present

## 2020-08-18 DIAGNOSIS — E1122 Type 2 diabetes mellitus with diabetic chronic kidney disease: Secondary | ICD-10-CM | POA: Diagnosis not present

## 2020-08-18 LAB — COMPREHENSIVE METABOLIC PANEL
ALT: 18 U/L (ref 0–53)
AST: 19 U/L (ref 0–37)
Albumin: 4.5 g/dL (ref 3.5–5.2)
Alkaline Phosphatase: 36 U/L — ABNORMAL LOW (ref 39–117)
BUN: 27 mg/dL — ABNORMAL HIGH (ref 6–23)
CO2: 22 mEq/L (ref 19–32)
Calcium: 8.9 mg/dL (ref 8.4–10.5)
Chloride: 101 mEq/L (ref 96–112)
Creatinine, Ser: 1.82 mg/dL — ABNORMAL HIGH (ref 0.40–1.50)
GFR: 36.04 mL/min — ABNORMAL LOW (ref >60.00)
Glucose, Bld: 101 mg/dL — ABNORMAL HIGH (ref 70–99)
Potassium: 4.2 mEq/L (ref 3.5–5.1)
Sodium: 134 mEq/L — ABNORMAL LOW (ref 135–145)
Total Bilirubin: 0.5 mg/dL (ref 0.2–1.2)
Total Protein: 7.1 g/dL (ref 6.0–8.3)

## 2020-08-18 LAB — HEMOGLOBIN A1C: Hgb A1c MFr Bld: 6.6 % — ABNORMAL HIGH (ref 4.6–6.5)

## 2020-08-22 MED FILL — Tamsulosin HCl Cap 0.4 MG: ORAL | 90 days supply | Qty: 90 | Fill #1 | Status: AC

## 2020-08-23 ENCOUNTER — Other Ambulatory Visit (HOSPITAL_COMMUNITY): Payer: Self-pay

## 2020-08-25 ENCOUNTER — Encounter: Payer: Self-pay | Admitting: Internal Medicine

## 2020-08-25 ENCOUNTER — Other Ambulatory Visit: Payer: Self-pay

## 2020-08-25 ENCOUNTER — Ambulatory Visit (INDEPENDENT_AMBULATORY_CARE_PROVIDER_SITE_OTHER): Payer: PPO | Admitting: Internal Medicine

## 2020-08-25 ENCOUNTER — Other Ambulatory Visit (HOSPITAL_COMMUNITY): Payer: Self-pay

## 2020-08-25 ENCOUNTER — Ambulatory Visit (INDEPENDENT_AMBULATORY_CARE_PROVIDER_SITE_OTHER): Payer: PPO

## 2020-08-25 DIAGNOSIS — I10 Essential (primary) hypertension: Secondary | ICD-10-CM

## 2020-08-25 DIAGNOSIS — N182 Chronic kidney disease, stage 2 (mild): Secondary | ICD-10-CM | POA: Diagnosis not present

## 2020-08-25 DIAGNOSIS — G8929 Other chronic pain: Secondary | ICD-10-CM | POA: Diagnosis not present

## 2020-08-25 DIAGNOSIS — M545 Low back pain, unspecified: Secondary | ICD-10-CM

## 2020-08-25 DIAGNOSIS — E785 Hyperlipidemia, unspecified: Secondary | ICD-10-CM

## 2020-08-25 DIAGNOSIS — E1122 Type 2 diabetes mellitus with diabetic chronic kidney disease: Secondary | ICD-10-CM

## 2020-08-25 DIAGNOSIS — N183 Chronic kidney disease, stage 3 unspecified: Secondary | ICD-10-CM

## 2020-08-25 MED ORDER — HYDROCODONE-ACETAMINOPHEN 5-325 MG PO TABS
1.0000 | ORAL_TABLET | Freq: Four times a day (QID) | ORAL | 0 refills | Status: DC | PRN
  Filled 2020-08-25: qty 60, 15d supply, fill #0

## 2020-08-25 MED ORDER — METHOCARBAMOL 500 MG PO TABS
ORAL_TABLET | Freq: Three times a day (TID) | ORAL | 1 refills | Status: DC | PRN
  Filled 2020-08-25: qty 30, 10d supply, fill #0
  Filled 2021-02-03: qty 30, 10d supply, fill #1

## 2020-08-25 NOTE — Assessment & Plan Note (Signed)
-   Crestor 

## 2020-08-25 NOTE — Assessment & Plan Note (Signed)
No NSAIDs 

## 2020-08-25 NOTE — Assessment & Plan Note (Addendum)
Severe L LBP/LLE pain Back brace prn Norco prn, Robaxin  Potential benefits of a short/long term opioids use as well as potential risks (i.e. addiction risk, apnea etc) and complications (i.e. Somnolence, constipation and others) were explained to the patient and were aknowledged. MRI if worse No heavy lifting

## 2020-08-25 NOTE — Assessment & Plan Note (Signed)
Amlodipine Mavik

## 2020-08-25 NOTE — Progress Notes (Signed)
Subjective:  Patient ID: Douglas Richards, male    DOB: July 23, 1945  Age: 75 y.o. MRN: [id number]  CC: Follow-up (3 month f/u)   HPI THEOPHILUS WALZ presents for LBP irrad to L leg all the way down to the L foot 6-7/10, much worse w/coughing, sneezing, straining  F/u DM, HTN  Outpatient Medications Prior to Visit  Medication Sig Dispense Refill   allopurinol (ZYLOPRIM) 100 MG tablet TAKE AS DIRECTED ONCE A DAY 90 tablet 3   amLODipine (NORVASC) 5 MG tablet TAKE 1 TABLET BY MOUTH DAILY. 90 tablet 2   ASPIRIN 81 PO Take by mouth.     Cholecalciferol (VITAMIN D3) 1000 UNITS tablet Take 1,000 Units by mouth daily.     Glucosamine-Chondroitin 750-600 MG TABS Take by mouth 2 (two) times daily. Reported on 04/28/2015     glucose blood (FREESTYLE LITE) test strip USE TO CHECK BLOOD SUGAR TWICE DAILY 100 strip 5   Lancets (FREESTYLE) lancets USE TO CHECK BLOOD SUGAR TWICE DAILY. Annual appt is due w/lab must see provider for future refills 100 each 5   loratadine (CLARITIN) 10 MG tablet Take 10 mg by mouth daily.     metFORMIN (GLUCOPHAGE) 500 MG tablet Take by mouth 2 (two) times daily with a meal. 180 tablet 3   methocarbamol (ROBAXIN) 500 MG tablet TAKE 1 TABLET BY MOUTH EVERY 8 HOURS AS NEEDED 30 tablet 1   MINERAL OIL PO Take by mouth.     omeprazole (PRILOSEC) 20 MG capsule TAKE 1 CAPSULE BY MOUTH ONCE DAILY 90 capsule 3   Psyllium (METAMUCIL PO) Take by mouth.     repaglinide (PRANDIN) 1 MG tablet TAKE 1 TABLET BY MOUTH THREE TIMES DAILY BEFORE MEALS. 270 tablet 3   rosuvastatin (CRESTOR) 10 MG tablet TAKE 1 TABLET BY MOUTH ONCE A DAY **DUE FOR ANNUAL APPOINTMENT** 90 tablet 3   Saw Palmetto, Serenoa repens, (SAW PALMETTO PO) Take by mouth.     tamsulosin (FLOMAX) 0.4 MG CAPS capsule TAKE 1 CAPSULE BY MOUTH ONCE DAILY 90 capsule 3   thiamine (VITAMIN B-1) 100 MG tablet Take 100 mg by mouth daily.     trandolapril (MAVIK) 4 MG tablet TAKE 1 TABLET BY MOUTH ONCE DAILY 90 tablet 3   No  facility-administered medications prior to visit.    ROS: Review of Systems  Constitutional:  Negative for appetite change, fatigue and unexpected weight change.  HENT:  Negative for congestion, nosebleeds, sneezing, sore throat and trouble swallowing.   Eyes:  Negative for itching and visual disturbance.  Respiratory:  Negative for cough.   Cardiovascular:  Negative for chest pain, palpitations and leg swelling.  Gastrointestinal:  Negative for abdominal distention, blood in stool, diarrhea and nausea.  Genitourinary:  Negative for frequency and hematuria.  Musculoskeletal:  Positive for back pain and gait problem. Negative for joint swelling and neck pain.  Skin:  Negative for rash.  Neurological:  Negative for dizziness, tremors, speech difficulty and weakness.  Psychiatric/Behavioral:  Negative for agitation, dysphoric mood and sleep disturbance. The patient is not nervous/anxious.    Objective:  BP 120/72 (BP Location: Left Arm)   Pulse 70   Temp 98 F (36.7 C) (Oral)   Ht 5\' 8"  (1.727 m)   Wt 186 lb 9.6 oz (84.6 kg)   SpO2 97%   BMI 28.37 kg/m   BP Readings from Last 3 Encounters:  08/25/20 120/72  08/04/20 99/64  05/26/20 124/80    Wt Readings from Last 3  Encounters:  08/25/20 186 lb 9.6 oz (84.6 kg)  08/04/20 189 lb (85.7 kg)  07/22/20 189 lb (85.7 kg)    Physical Exam Constitutional:      General: He is not in acute distress.    Appearance: He is well-developed.     Comments: NAD  Eyes:     Conjunctiva/sclera: Conjunctivae normal.     Pupils: Pupils are equal, round, and reactive to light.  Neck:     Thyroid: No thyromegaly.     Vascular: No JVD.  Cardiovascular:     Rate and Rhythm: Normal rate and regular rhythm.     Heart sounds: Normal heart sounds. No murmur heard.   No friction rub. No gallop.  Pulmonary:     Effort: Pulmonary effort is normal. No respiratory distress.     Breath sounds: Normal breath sounds. No wheezing or rales.  Chest:      Chest wall: No tenderness.  Abdominal:     General: Bowel sounds are normal. There is no distension.     Palpations: Abdomen is soft. There is no mass.     Tenderness: There is no abdominal tenderness. There is no guarding or rebound.  Musculoskeletal:        General: No tenderness. Normal range of motion.     Cervical back: Normal range of motion.  Lymphadenopathy:     Cervical: No cervical adenopathy.  Skin:    General: Skin is warm and dry.     Findings: No rash.  Neurological:     Mental Status: He is alert and oriented to person, place, and time.     Cranial Nerves: No cranial nerve deficit.     Motor: No abnormal muscle tone.     Coordination: Coordination normal.     Gait: Gait normal.     Deep Tendon Reflexes: Reflexes are normal and symmetric.  Psychiatric:        Behavior: Behavior normal.        Thought Content: Thought content normal.        Judgment: Judgment normal.   LS tender on the L Str leg elev (-) B   Lab Results  Component Value Date   WBC 6.0 09/17/2015   HGB 13.5 09/17/2015   HCT 40.5 09/17/2015   PLT 151.0 09/17/2015   GLUCOSE 101 (H) 08/18/2020   CHOL 122 09/17/2015   TRIG 150.0 (H) 09/17/2015   HDL 40.30 09/17/2015   LDLDIRECT 105.5 09/10/2013   LDLCALC 51 09/17/2015   ALT 18 08/18/2020   AST 19 08/18/2020   NA 134 (L) 08/18/2020   K 4.2 08/18/2020   CL 101 08/18/2020   CREATININE 1.82 (H) 08/18/2020   BUN 27 (H) 08/18/2020   CO2 22 08/18/2020   TSH 2.65 09/17/2015   PSA 0.81 09/17/2015   HGBA1C 6.6 (H) 08/18/2020   MICROALBUR 0.4 03/31/2010    No results found.  Assessment & Plan:     [name], MD

## 2020-08-25 NOTE — Assessment & Plan Note (Signed)
On Prandin 

## 2020-08-29 ENCOUNTER — Other Ambulatory Visit: Payer: Self-pay | Admitting: Internal Medicine

## 2020-08-30 ENCOUNTER — Other Ambulatory Visit (HOSPITAL_COMMUNITY): Payer: Self-pay

## 2020-08-30 MED ORDER — OMEPRAZOLE 20 MG PO CPDR
DELAYED_RELEASE_CAPSULE | Freq: Every day | ORAL | 1 refills | Status: DC
Start: 2020-08-30 — End: 2021-02-13
  Filled 2020-08-30: qty 90, 90d supply, fill #0
  Filled 2020-11-21: qty 90, 90d supply, fill #1

## 2020-08-31 ENCOUNTER — Telehealth: Payer: Self-pay | Admitting: *Deleted

## 2020-08-31 NOTE — Telephone Encounter (Signed)
Duplicate request... see previous PA msg../lmb

## 2020-08-31 NOTE — Telephone Encounter (Signed)
-----   Message from Marguarite Arbour sent at 08/30/2020  2:12 PM EDT ----- Pa request

## 2020-09-03 ENCOUNTER — Telehealth: Payer: Self-pay | Admitting: *Deleted

## 2020-09-03 NOTE — Telephone Encounter (Signed)
-----   Message from Marguarite Arbour sent at 08/25/2020  3:52 PM EDT ----- PA request

## 2020-09-03 NOTE — Telephone Encounter (Signed)
Per cover-my-meds portal PA was canceled.Marland KitchenJohny Richards

## 2020-09-06 DIAGNOSIS — D631 Anemia in chronic kidney disease: Secondary | ICD-10-CM | POA: Diagnosis not present

## 2020-09-06 DIAGNOSIS — N2581 Secondary hyperparathyroidism of renal origin: Secondary | ICD-10-CM | POA: Diagnosis not present

## 2020-09-06 DIAGNOSIS — I129 Hypertensive chronic kidney disease with stage 1 through stage 4 chronic kidney disease, or unspecified chronic kidney disease: Secondary | ICD-10-CM | POA: Diagnosis not present

## 2020-09-06 DIAGNOSIS — E785 Hyperlipidemia, unspecified: Secondary | ICD-10-CM | POA: Diagnosis not present

## 2020-09-06 DIAGNOSIS — N1832 Chronic kidney disease, stage 3b: Secondary | ICD-10-CM | POA: Diagnosis not present

## 2020-09-07 DIAGNOSIS — H35373 Puckering of macula, bilateral: Secondary | ICD-10-CM | POA: Diagnosis not present

## 2020-09-07 DIAGNOSIS — H43811 Vitreous degeneration, right eye: Secondary | ICD-10-CM | POA: Diagnosis not present

## 2020-09-07 DIAGNOSIS — H4911 Fourth [trochlear] nerve palsy, right eye: Secondary | ICD-10-CM | POA: Diagnosis not present

## 2020-09-07 DIAGNOSIS — Z961 Presence of intraocular lens: Secondary | ICD-10-CM | POA: Diagnosis not present

## 2020-10-10 ENCOUNTER — Other Ambulatory Visit: Payer: Self-pay | Admitting: Internal Medicine

## 2020-10-10 MED ORDER — ROSUVASTATIN CALCIUM 10 MG PO TABS
ORAL_TABLET | ORAL | 3 refills | Status: DC
  Filled 2020-10-10: qty 90, 90d supply, fill #0
  Filled 2021-01-09: qty 90, 90d supply, fill #1
  Filled 2021-04-03: qty 90, 90d supply, fill #2
  Filled 2021-06-26: qty 90, 90d supply, fill #3

## 2020-10-10 MED ORDER — AMLODIPINE BESYLATE 5 MG PO TABS
ORAL_TABLET | Freq: Every day | ORAL | 2 refills | Status: DC
  Filled 2020-10-10: qty 90, 90d supply, fill #0
  Filled 2021-01-09: qty 90, 90d supply, fill #1
  Filled 2021-04-03: qty 90, 90d supply, fill #2

## 2020-10-11 ENCOUNTER — Other Ambulatory Visit (HOSPITAL_COMMUNITY): Payer: Self-pay

## 2020-10-12 ENCOUNTER — Other Ambulatory Visit (HOSPITAL_COMMUNITY): Payer: Self-pay

## 2020-10-13 ENCOUNTER — Other Ambulatory Visit (HOSPITAL_COMMUNITY): Payer: Self-pay

## 2020-10-19 ENCOUNTER — Telehealth: Payer: Self-pay | Admitting: Internal Medicine

## 2020-10-19 DIAGNOSIS — G8929 Other chronic pain: Secondary | ICD-10-CM

## 2020-10-19 NOTE — Telephone Encounter (Signed)
Patient says he is still having pain on left back side that is going down into his leg.. pain has been ongoing since last visit  Patient wants to know if referral can be submitted for him to be seen for this ongoing issue  Please call [phone]

## 2020-10-21 NOTE — Telephone Encounter (Signed)
We will schedule an MRI of the lower back.  Please schedule an appointment to see me with results Thanks

## 2020-10-21 NOTE — Telephone Encounter (Signed)
Notified pt with MD response../lmb 

## 2020-11-06 ENCOUNTER — Ambulatory Visit
Admission: RE | Admit: 2020-11-06 | Discharge: 2020-11-06 | Disposition: A | Discharge status: Home / Self Care | Payer: PPO | Source: Ambulatory Visit | Attending: Internal Medicine | Admitting: Internal Medicine

## 2020-11-06 ENCOUNTER — Other Ambulatory Visit: Payer: Self-pay

## 2020-11-06 DIAGNOSIS — G8929 Other chronic pain: Secondary | ICD-10-CM

## 2020-11-06 DIAGNOSIS — M545 Low back pain, unspecified: Secondary | ICD-10-CM | POA: Diagnosis not present

## 2020-11-06 DIAGNOSIS — M48061 Spinal stenosis, lumbar region without neurogenic claudication: Secondary | ICD-10-CM | POA: Diagnosis not present

## 2020-11-07 MED FILL — Allopurinol Tab 100 MG: ORAL | 90 days supply | Qty: 90 | Fill #2 | Status: AC

## 2020-11-08 ENCOUNTER — Other Ambulatory Visit: Payer: Self-pay | Admitting: Internal Medicine

## 2020-11-08 ENCOUNTER — Other Ambulatory Visit (HOSPITAL_COMMUNITY): Payer: Self-pay

## 2020-11-08 DIAGNOSIS — M48062 Spinal stenosis, lumbar region with neurogenic claudication: Secondary | ICD-10-CM | POA: Insufficient documentation

## 2020-11-09 ENCOUNTER — Other Ambulatory Visit (HOSPITAL_COMMUNITY): Payer: Self-pay

## 2020-11-09 ENCOUNTER — Other Ambulatory Visit: Payer: Self-pay | Admitting: Internal Medicine

## 2020-11-11 ENCOUNTER — Other Ambulatory Visit (HOSPITAL_COMMUNITY): Payer: Self-pay

## 2020-11-11 MED ORDER — HYDROCODONE-ACETAMINOPHEN 5-325 MG PO TABS
1.0000 | ORAL_TABLET | Freq: Four times a day (QID) | ORAL | 0 refills | Status: DC | PRN
  Filled 2020-11-11: qty 60, 15d supply, fill #0

## 2020-11-14 ENCOUNTER — Other Ambulatory Visit: Payer: Self-pay | Admitting: Internal Medicine

## 2020-11-16 ENCOUNTER — Other Ambulatory Visit (HOSPITAL_COMMUNITY): Payer: Self-pay

## 2020-11-16 ENCOUNTER — Other Ambulatory Visit: Payer: Self-pay | Admitting: Internal Medicine

## 2020-11-16 MED FILL — Tamsulosin HCl Cap 0.4 MG: ORAL | 90 days supply | Qty: 90 | Fill #0 | Status: AC

## 2020-11-22 ENCOUNTER — Other Ambulatory Visit (INDEPENDENT_AMBULATORY_CARE_PROVIDER_SITE_OTHER): Payer: PPO

## 2020-11-22 ENCOUNTER — Other Ambulatory Visit (HOSPITAL_COMMUNITY): Payer: Self-pay

## 2020-11-22 DIAGNOSIS — E118 Type 2 diabetes mellitus with unspecified complications: Secondary | ICD-10-CM | POA: Diagnosis not present

## 2020-11-22 LAB — BASIC METABOLIC PANEL
BUN: 25 mg/dL — ABNORMAL HIGH (ref 6–23)
CO2: 22 mEq/L (ref 19–32)
Calcium: 9.4 mg/dL (ref 8.4–10.5)
Chloride: 103 mEq/L (ref 96–112)
Creatinine, Ser: 1.73 mg/dL — ABNORMAL HIGH (ref 0.40–1.50)
GFR: 38.23 mL/min — ABNORMAL LOW (ref >60.00)
Glucose, Bld: 104 mg/dL — ABNORMAL HIGH (ref 70–99)
Potassium: 4.4 mEq/L (ref 3.5–5.1)
Sodium: 135 mEq/L (ref 135–145)

## 2020-11-22 LAB — HEMOGLOBIN A1C: Hgb A1c MFr Bld: 6.5 % (ref 4.6–6.5)

## 2020-11-25 ENCOUNTER — Other Ambulatory Visit: Payer: Self-pay

## 2020-11-25 ENCOUNTER — Encounter: Payer: Self-pay | Admitting: Internal Medicine

## 2020-11-25 ENCOUNTER — Other Ambulatory Visit (HOSPITAL_COMMUNITY): Payer: Self-pay

## 2020-11-25 ENCOUNTER — Ambulatory Visit (INDEPENDENT_AMBULATORY_CARE_PROVIDER_SITE_OTHER): Payer: PPO | Admitting: Internal Medicine

## 2020-11-25 VITALS — BP 141/80 | HR 69 | Temp 97.8°F | Ht 68.0 in | Wt 186.6 lb

## 2020-11-25 DIAGNOSIS — N183 Chronic kidney disease, stage 3 unspecified: Secondary | ICD-10-CM

## 2020-11-25 DIAGNOSIS — G8929 Other chronic pain: Secondary | ICD-10-CM

## 2020-11-25 DIAGNOSIS — M545 Low back pain, unspecified: Secondary | ICD-10-CM

## 2020-11-25 DIAGNOSIS — Z23 Encounter for immunization: Secondary | ICD-10-CM | POA: Diagnosis not present

## 2020-11-25 DIAGNOSIS — E1122 Type 2 diabetes mellitus with diabetic chronic kidney disease: Secondary | ICD-10-CM

## 2020-11-25 DIAGNOSIS — I10 Essential (primary) hypertension: Secondary | ICD-10-CM

## 2020-11-25 DIAGNOSIS — N182 Chronic kidney disease, stage 2 (mild): Secondary | ICD-10-CM | POA: Diagnosis not present

## 2020-11-25 MED ORDER — HYDROCODONE-ACETAMINOPHEN 5-325 MG PO TABS
1.0000 | ORAL_TABLET | Freq: Four times a day (QID) | ORAL | 0 refills | Status: DC | PRN
  Filled 2020-11-25: qty 120, 30d supply, fill #0

## 2020-11-25 NOTE — Assessment & Plan Note (Signed)
Cont on Amlodipine, Mavik

## 2020-11-25 NOTE — Assessment & Plan Note (Signed)
Worse Spinal stenosis - severe Back brace NS appt w/Dr Ellene Route on Nov 4 pending Norco prn  Potential benefits of a short and long term opioids use as well as potential risks (i.e. addiction risk, apnea etc) and complications (i.e. Somnolence, constipation and others) were explained to the patient and were aknowledged.

## 2020-11-25 NOTE — Assessment & Plan Note (Signed)
Cont on Prandin  Monitor A1c

## 2020-11-25 NOTE — Assessment & Plan Note (Signed)
Hydrate well Avoid NSAIDs Nephrology consult

## 2020-11-25 NOTE — Patient Instructions (Signed)
Spinal Stenosis Spinal stenosis is a condition that happens when the spinal canal narrows. The spinal canal is the space between the bones of your spine (vertebrae). This narrowing puts pressure on the spinal cord or nerves. Spinal stenosis can affect the vertebrae in the neck, upper back, and lower back. This condition can range from mild to severe. In some cases, there are no symptoms. What are the causes? This condition is caused by areas of bone pushing into the spinal canal. This condition may be present at birth (congenital), or it may be caused by: Slow breakdown of your vertebrae (spinal degeneration). This usually starts around 75 years of age. Injury (trauma) to your spine. Tumors in your spine. Calcium deposits in your spine. What increases the risk? The following factors may make you more likely to develop this condition: Being older than age 50. Having a problem present at birth with an abnormally shaped spine (congenitalspinal deformity), such as scoliosis. Having arthritis. What are the signs or symptoms? Symptoms of this condition include: Pain in the neck or back that is generally worse with activities, particularly when you stand or walk. Numbness, tingling, hot or cold sensations, weakness, or tiredness (fatigue) in your leg or legs. Pain going from the buttock, down the thigh, and to the calf (sciatica). This can happen in one or both legs. Frequent episodes of falling. A foot-slapping gait that leads to muscle weakness. In more severe cases, you may develop: Problems having a bowel movement or urinating. Difficulty having sex. Loss of feeling in your legs and inability to walk. Symptoms may come on slowly and get worse over time. In some cases, there are no symptoms. How is this diagnosed? This condition is diagnosed based on your medical history and a physical exam. You will also have tests, such as an MRI, a CT scan, or an X-ray. How is this treated? Treatment for  this condition often focuses on managing your pain and any other symptoms. Treatment may include: Practicing good posture to lessen pressure on your nerves. Exercising to strengthen muscles, build endurance, improve balance, and maintain range of motion. This may include physical therapy to restore movement and strength to your back. Losing weight, if needed. Medicines to reduce inflammation or pain. This may include a medicine that is injected into your spine (steroidinjection). Assistive devices, such as a corset or brace. In some cases, surgery may be needed. The most common procedure is decompression laminectomy. This is done to remove excess bone that puts pressure on your nerve roots. Follow these instructions at home: Managing pain, stiffness, and swelling  Practice good posture. If you were given a brace or a corset, wear it as told by your health care provider. Maintain a healthy weight. Talk with your health care provider if you need help losing weight. If directed, apply heat to the affected area as often as told by your health care provider. Use the heat source that your health care provider recommends, such as a moist heat pack or a heating pad. Place a towel between your skin and the heat source. Leave the heat on for 20-30 minutes. Remove the heat if your skin turns bright red. This is especially important if you are unable to feel pain, heat, or cold. You may have a greater risk of getting burned. Activity Do all exercises and stretches as told by your health care provider. Do not do any activities that cause pain. Ask your health care provider what activities are safe for you. Do   not lift anything that is heavier than 10 lb (4.5 kg), or the limit that you are told by your health care provider. Return to your normal activities as told by your health care provider. Ask your health care provider what activities are safe for you. General instructions Take over-the-counter and  prescription medicines only as told by your health care provider. Do not use any products that contain nicotine or tobacco, such as cigarettes, e-cigarettes, and chewing tobacco. If you need help quitting, ask your health care provider. Eat a healthy diet. This includes plenty of fruits and vegetables, whole grains, and low-fat (lean) protein. Keep all follow-up visits as told by your health care provider. This is important. Contact a health care provider if: Your symptoms do not get better or they get worse. You have a fever. Get help right away if: You have new pain or symptoms of severe pain, such as: New or worsening pain in your neck or upper back. Severe pain that cannot be controlled with medicines. A severe headache that gets worse when you stand. You are dizzy. You have vision problems, such as blurred vision or double vision. You have nausea or you vomit. You develop new or worsening numbness or tingling in your back or legs. You have pain, redness, swelling, or warmth in your arm or leg. Summary Spinal stenosis is a condition that happens when the spinal canal narrows. The spinal canal is the space between the bones of your spine (vertebrae). This narrowing puts pressure on the spinal cord or nerves. This condition may be caused by a birth defect, breakdown of your vertebrae, trauma, tumors, or calcium deposits. Spinal stenosis can cause numbness, weakness, or pain in the buttocks, neck, back, and legs. This condition is usually diagnosed with your medical history, a physical exam, and tests, such as an MRI, a CT scan, or an X-ray. This information is not intended to replace advice given to you by your health care provider. Make sure you discuss any questions you have with your health care provider. Document Revised: 11/21/2018 Document Reviewed: 11/21/2018 Elsevier Patient Education  2022 [employer]  

## 2020-11-25 NOTE — Progress Notes (Signed)
Subjective:  Patient ID: Douglas Richards, male    DOB: 07/07/1945  Age: 75 y.o. MRN: [id number]  CC: Follow-up (3 month f/u- Flu shot)   HPI Douglas Richards presents for LBP - worse (L sciatica pain with walking 7/10), NS appt on Nov 4 pending F/u HTN, CRF  Outpatient Medications Prior to Visit  Medication Sig Dispense Refill   allopurinol (ZYLOPRIM) 100 MG tablet TAKE AS DIRECTED ONCE A DAY 90 tablet 3   amLODipine (NORVASC) 5 MG tablet TAKE 1 TABLET BY MOUTH DAILY. 90 tablet 2   ASPIRIN 81 PO Take by mouth.     Cholecalciferol (VITAMIN D3) 1000 UNITS tablet Take 1,000 Units by mouth daily.     Glucosamine-Chondroitin 750-600 MG TABS Take by mouth 2 (two) times daily. Reported on 04/28/2015     glucose blood (FREESTYLE LITE) test strip USE TO CHECK BLOOD SUGAR TWICE DAILY 100 strip 5   Lancets (FREESTYLE) lancets USE TO CHECK BLOOD SUGAR TWICE DAILY. Annual appt is due w/lab must see provider for future refills 100 each 5   loratadine (CLARITIN) 10 MG tablet Take 10 mg by mouth daily.     metFORMIN (GLUCOPHAGE) 500 MG tablet Take 1 tablet by mouth 2 (two) times daily with a meal. 180 tablet 3   methocarbamol (ROBAXIN) 500 MG tablet TAKE 1 TABLET BY MOUTH EVERY 8 HOURS AS NEEDED 30 tablet 1   MINERAL OIL PO Take by mouth.     omeprazole (PRILOSEC) 20 MG capsule TAKE 1 CAPSULE BY MOUTH ONCE DAILY 90 capsule 1   Psyllium (METAMUCIL PO) Take by mouth.     rosuvastatin (CRESTOR) 10 MG tablet TAKE 1 TABLET BY MOUTH ONCE A DAY **DUE FOR ANNUAL APPOINTMENT** 90 tablet 3   Saw Palmetto, Serenoa repens, (SAW PALMETTO PO) Take by mouth.     tamsulosin (FLOMAX) 0.4 MG CAPS capsule TAKE 1 CAPSULE BY MOUTH ONCE DAILY 90 capsule 3   thiamine (VITAMIN B-1) 100 MG tablet Take 100 mg by mouth daily.     trandolapril (MAVIK) 4 MG tablet TAKE 1 TABLET BY MOUTH ONCE DAILY 90 tablet 3   HYDROcodone-acetaminophen (NORCO/VICODIN) 5-325 MG tablet TAKE 1 TABLET BY MOUTH EVERY 6 HOURS AS NEEDED FOR SEVERE PAIN 60  tablet 0   repaglinide (PRANDIN) 1 MG tablet TAKE 1 TABLET BY MOUTH THREE TIMES DAILY BEFORE MEALS. 270 tablet 3   No facility-administered medications prior to visit.    ROS: Review of Systems  Constitutional:  Negative for appetite change, fatigue and unexpected weight change.  HENT:  Negative for congestion, nosebleeds, sneezing, sore throat and trouble swallowing.   Eyes:  Negative for itching and visual disturbance.  Respiratory:  Negative for cough.   Cardiovascular:  Negative for chest pain, palpitations and leg swelling.  Gastrointestinal:  Negative for abdominal distention, blood in stool, diarrhea and nausea.  Genitourinary:  Negative for frequency and hematuria.  Musculoskeletal:  Positive for back pain and gait problem. Negative for joint swelling and neck pain.  Skin:  Negative for rash.  Neurological:  Negative for dizziness, tremors, speech difficulty and weakness.  Psychiatric/Behavioral:  Negative for agitation, dysphoric mood and sleep disturbance. The patient is not nervous/anxious.    Objective:  BP (!) 141/80 (BP Location: Left Arm)   Pulse 69   Temp 97.8 F (36.6 C) (Oral)   Ht 5\' 8"  (1.727 m)   Wt 186 lb 9.6 oz (84.6 kg)   SpO2 96%   BMI 28.37 kg/m  BP Readings from Last 3 Encounters:  11/25/20 (!) 141/80  08/25/20 120/72  08/04/20 99/64    Wt Readings from Last 3 Encounters:  11/25/20 186 lb 9.6 oz (84.6 kg)  08/25/20 186 lb 9.6 oz (84.6 kg)  08/04/20 189 lb (85.7 kg)    Physical Exam Constitutional:      General: He is not in acute distress.    Appearance: He is well-developed.     Comments: NAD  Eyes:     Conjunctiva/sclera: Conjunctivae normal.     Pupils: Pupils are equal, round, and reactive to light.  Neck:     Thyroid: No thyromegaly.     Vascular: No JVD.  Cardiovascular:     Rate and Rhythm: Normal rate and regular rhythm.     Heart sounds: Normal heart sounds. No murmur heard.   No friction rub. No gallop.  Pulmonary:      Effort: Pulmonary effort is normal. No respiratory distress.     Breath sounds: Normal breath sounds. No wheezing or rales.  Chest:     Chest wall: No tenderness.  Abdominal:     General: Bowel sounds are normal. There is no distension.     Palpations: Abdomen is soft. There is no mass.     Tenderness: There is no abdominal tenderness. There is no guarding or rebound.  Musculoskeletal:        General: No tenderness. Normal range of motion.     Cervical back: Normal range of motion.  Lymphadenopathy:     Cervical: No cervical adenopathy.  Skin:    General: Skin is warm and dry.     Findings: No rash.  Neurological:     Mental Status: He is alert and oriented to person, place, and time.     Cranial Nerves: No cranial nerve deficit.     Motor: No abnormal muscle tone.     Coordination: Coordination normal.     Gait: Gait normal.     Deep Tendon Reflexes: Reflexes are normal and symmetric.  Psychiatric:        Behavior: Behavior normal.        Thought Content: Thought content normal.        Judgment: Judgment normal.    Lab Results  Component Value Date   WBC 6.0 09/17/2015   HGB 13.5 09/17/2015   HCT 40.5 09/17/2015   PLT 151.0 09/17/2015   GLUCOSE 104 (H) 11/22/2020   CHOL 122 09/17/2015   TRIG 150.0 (H) 09/17/2015   HDL 40.30 09/17/2015   LDLDIRECT 105.5 09/10/2013   LDLCALC 51 09/17/2015   ALT 18 08/18/2020   AST 19 08/18/2020   NA 135 11/22/2020   K 4.4 11/22/2020   CL 103 11/22/2020   CREATININE 1.73 (H) 11/22/2020   BUN 25 (H) 11/22/2020   CO2 22 11/22/2020   TSH 2.65 09/17/2015   PSA 0.81 09/17/2015   HGBA1C 6.5 11/22/2020   MICROALBUR 0.4 03/31/2010    MR Lumbar Spine Wo Contrast  Result Date: 11/08/2020 CLINICAL DATA:  Persistent low back pain radiating down left leg since July EXAM: MRI LUMBAR SPINE WITHOUT CONTRAST TECHNIQUE: Multiplanar, multisequence MR imaging of the lumbar spine was performed. No intravenous contrast was administered. COMPARISON:   Lumbar spine radiographs 08/25/2020, CT abdomen/pelvis 11/04/2014 FINDINGS: Segmentation: Standard; the lowest formed disc space is designated L5-S1 Alignment: There is straightening of the normal lumbar spine lordosis. There is trace retrolisthesis of L5 on S1, similar to the prior CT from 2016. There is no  other anterior or retrolisthesis. Vertebrae: Vertebral body heights are preserved. There is mild degenerative endplate marrow signal abnormality posteriorly at L3-L4. Otherwise, there is no focal marrow signal abnormality. Conus medullaris and cauda equina: Conus extends to the L1-L2 level. Conus and cauda equina appear normal. Paraspinal and other soft tissues: A small T2 hyperintense lesion in the right kidney is incompletely characterized but likely reflects a cyst. The paraspinal soft tissues are unremarkable. Disc levels: There is mild multilevel disc desiccation and narrowing. There is multilevel facet arthropathy, most advanced at L4-L5. There are associated trace facet joint effusions. T12-L1: No significant spinal canal or neural foraminal stenosis. L1-L2: There is a minimal disc protrusion without significant spinal canal or neural foraminal stenosis. L2-L3: There is a mild disc bulge mild ligamentum flavum thickening, and mild bilateral facet arthropathy without significant spinal canal or neural foraminal stenosis. L3-L4: There is a diffuse disc bulge with a superimposed superiorly migrated central disc extrusion, bulky ligamentum flavum thickening, and bilateral facet arthropathy resulting in mild-to-moderate spinal canal stenosis with narrowing of the bilateral subarticular zones, right worse than left, without definite evidence of frank nerve root impingement. There is mild right worse than left neural foraminal stenosis. L4-L5: There is a diffuse disc bulge, ligamentum flavum thickening, and bilateral facet arthropathy resulting in severe spinal canal stenosis with compression of the cauda equina  nerve roots particularly involving the traversing nerve roots in the right subarticular zone and mild to moderate right and mild left neural foraminal stenosis. L5-S1: There is a mild disc bulge and bilateral facet arthropathy resulting in mild crowding of the subarticular zones without evidence of nerve root impingement and mild to moderate right and mild left neural foraminal stenosis. IMPRESSION: 1. 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 Mild to moderate right and mild left neural foraminal stenosis at this level. 2. Diffuse disc bulge with a small superimposed superiorly migrated extrusion at L3-L4 resulting in mild-to-moderate spinal canal stenosis with crowding of the bilateral subarticular zones without definite evidence of nerve root impingement. Mild right worse than left neural foraminal stenosis at this level. 3. Mild-to-moderate right and mild left neural foraminal stenosis at L5-S1. 4. 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 Electronically Signed   By: Valetta Mole M.D.   On: 11/08/2020 10:18    Assessment & Plan:   Problem List Items Addressed This Visit     CRI (chronic renal insufficiency), stage 3 (moderate)    Hydrate well Avoid NSAIDs Nephrology consult      DM2 (diabetes mellitus, type 2) ([place])     Cont on Prandin  Monitor A1c      Essential hypertension    Cont on Amlodipine, Mavik        Low back pain    Worse Spinal stenosis - severe Back brace NS appt w/Dr Ellene Route on Nov 4 pending Norco prn  Potential benefits of a short and long term opioids use as well as potential risks (i.e. addiction risk, apnea etc) and complications (i.e. Somnolence, constipation and others) were explained to the patient and were  aknowledged.       Relevant Medications   HYDROcodone-acetaminophen (NORCO/VICODIN) 5-325 MG tablet   Other Visit Diagnoses     Needs flu shot    -  Primary   Relevant Orders  Flu Vaccine QUAD High Dose(Fluad) (Completed)         Meds ordered this encounter  Medications   HYDROcodone-acetaminophen (NORCO/VICODIN) 5-325 MG tablet    Sig: TAKE 1 TABLET BY MOUTH EVERY 6 HOURS AS NEEDED FOR SEVERE PAIN    Dispense:  120 tablet    Refill:  0      Follow-up: Return in about 3 months (around 02/25/2021).  [name], MD

## 2020-11-26 ENCOUNTER — Other Ambulatory Visit (HOSPITAL_COMMUNITY): Payer: Self-pay

## 2020-11-26 ENCOUNTER — Other Ambulatory Visit: Payer: Self-pay | Admitting: Internal Medicine

## 2020-11-26 MED ORDER — REPAGLINIDE 1 MG PO TABS
ORAL_TABLET | ORAL | 3 refills | Status: DC
  Filled 2020-11-26: qty 270, 90d supply, fill #0
  Filled 2021-03-06: qty 270, 90d supply, fill #1
  Filled 2021-09-04: qty 35, 11d supply, fill #2
  Filled 2021-09-06: qty 235, 79d supply, fill #2

## 2020-11-29 ENCOUNTER — Other Ambulatory Visit (HOSPITAL_COMMUNITY): Payer: Self-pay

## 2020-12-13 ENCOUNTER — Other Ambulatory Visit (HOSPITAL_COMMUNITY): Payer: Self-pay

## 2020-12-13 ENCOUNTER — Other Ambulatory Visit: Payer: Self-pay | Admitting: Internal Medicine

## 2020-12-13 MED ORDER — FREESTYLE LITE TEST VI STRP
ORAL_STRIP | 5 refills | Status: DC
  Filled 2020-12-13: qty 100, 50d supply, fill #0
  Filled 2021-11-06: qty 100, 50d supply, fill #1

## 2020-12-24 DIAGNOSIS — M48062 Spinal stenosis, lumbar region with neurogenic claudication: Secondary | ICD-10-CM | POA: Diagnosis not present

## 2020-12-24 DIAGNOSIS — Z6828 Body mass index (BMI) 28.0-28.9, adult: Secondary | ICD-10-CM | POA: Diagnosis not present

## 2021-01-07 ENCOUNTER — Other Ambulatory Visit: Payer: Self-pay | Admitting: Internal Medicine

## 2021-01-07 ENCOUNTER — Other Ambulatory Visit (HOSPITAL_COMMUNITY): Payer: Self-pay

## 2021-01-08 ENCOUNTER — Other Ambulatory Visit (HOSPITAL_COMMUNITY): Payer: Self-pay

## 2021-01-09 MED ORDER — HYDROCODONE-ACETAMINOPHEN 5-325 MG PO TABS
1.0000 | ORAL_TABLET | Freq: Four times a day (QID) | ORAL | 0 refills | Status: DC | PRN
  Filled 2021-01-09: qty 120, 30d supply, fill #0

## 2021-01-10 ENCOUNTER — Other Ambulatory Visit (HOSPITAL_COMMUNITY): Payer: Self-pay

## 2021-01-11 ENCOUNTER — Other Ambulatory Visit (HOSPITAL_COMMUNITY): Payer: Self-pay

## 2021-01-12 DIAGNOSIS — M48062 Spinal stenosis, lumbar region with neurogenic claudication: Secondary | ICD-10-CM | POA: Diagnosis not present

## 2021-01-12 DIAGNOSIS — M5459 Other low back pain: Secondary | ICD-10-CM | POA: Diagnosis not present

## 2021-01-18 DIAGNOSIS — M5416 Radiculopathy, lumbar region: Secondary | ICD-10-CM | POA: Diagnosis not present

## 2021-01-18 DIAGNOSIS — M5116 Intervertebral disc disorders with radiculopathy, lumbar region: Secondary | ICD-10-CM | POA: Diagnosis not present

## 2021-01-20 DIAGNOSIS — M48062 Spinal stenosis, lumbar region with neurogenic claudication: Secondary | ICD-10-CM | POA: Diagnosis not present

## 2021-01-20 DIAGNOSIS — M5459 Other low back pain: Secondary | ICD-10-CM | POA: Diagnosis not present

## 2021-02-03 ENCOUNTER — Other Ambulatory Visit (HOSPITAL_COMMUNITY): Payer: Self-pay

## 2021-02-09 DIAGNOSIS — M5459 Other low back pain: Secondary | ICD-10-CM | POA: Diagnosis not present

## 2021-02-09 DIAGNOSIS — M48062 Spinal stenosis, lumbar region with neurogenic claudication: Secondary | ICD-10-CM | POA: Diagnosis not present

## 2021-02-13 ENCOUNTER — Other Ambulatory Visit: Payer: Self-pay | Admitting: Internal Medicine

## 2021-02-13 MED FILL — Tamsulosin HCl Cap 0.4 MG: ORAL | 90 days supply | Qty: 90 | Fill #1 | Status: AC

## 2021-02-14 ENCOUNTER — Other Ambulatory Visit (HOSPITAL_COMMUNITY): Payer: Self-pay

## 2021-02-14 MED ORDER — ALLOPURINOL 100 MG PO TABS
ORAL_TABLET | ORAL | 3 refills | Status: DC
  Filled 2021-02-14: qty 90, 90d supply, fill #0
  Filled 2021-05-08: qty 90, 90d supply, fill #1
  Filled 2021-07-31: qty 90, 90d supply, fill #2

## 2021-02-14 MED ORDER — OMEPRAZOLE 20 MG PO CPDR
DELAYED_RELEASE_CAPSULE | Freq: Every day | ORAL | 3 refills | Status: DC
  Filled 2021-02-14: qty 90, 90d supply, fill #0
  Filled 2021-05-08: qty 90, 90d supply, fill #1
  Filled 2021-07-31: qty 90, 90d supply, fill #2
  Filled 2021-11-06: qty 90, 90d supply, fill #3

## 2021-02-22 DIAGNOSIS — M5459 Other low back pain: Secondary | ICD-10-CM | POA: Diagnosis not present

## 2021-02-22 DIAGNOSIS — M48062 Spinal stenosis, lumbar region with neurogenic claudication: Secondary | ICD-10-CM | POA: Diagnosis not present

## 2021-02-23 ENCOUNTER — Other Ambulatory Visit (INDEPENDENT_AMBULATORY_CARE_PROVIDER_SITE_OTHER): Payer: PPO

## 2021-02-23 DIAGNOSIS — E118 Type 2 diabetes mellitus with unspecified complications: Secondary | ICD-10-CM | POA: Diagnosis not present

## 2021-02-23 LAB — BASIC METABOLIC PANEL
BUN: 23 mg/dL (ref 6–23)
CO2: 26 mEq/L (ref 19–32)
Calcium: 9.6 mg/dL (ref 8.4–10.5)
Chloride: 99 mEq/L (ref 96–112)
Creatinine, Ser: 1.83 mg/dL — ABNORMAL HIGH (ref 0.40–1.50)
GFR: 35.68 mL/min — ABNORMAL LOW (ref >60.00)
Glucose, Bld: 99 mg/dL (ref 70–99)
Potassium: 4.5 mEq/L (ref 3.5–5.1)
Sodium: 135 mEq/L (ref 135–145)

## 2021-02-23 LAB — HEMOGLOBIN A1C: Hgb A1c MFr Bld: 6.9 % — ABNORMAL HIGH (ref 4.6–6.5)

## 2021-03-01 ENCOUNTER — Ambulatory Visit (INDEPENDENT_AMBULATORY_CARE_PROVIDER_SITE_OTHER): Payer: PPO | Admitting: Internal Medicine

## 2021-03-01 ENCOUNTER — Ambulatory Visit (INDEPENDENT_AMBULATORY_CARE_PROVIDER_SITE_OTHER): Payer: PPO

## 2021-03-01 ENCOUNTER — Other Ambulatory Visit (HOSPITAL_COMMUNITY): Payer: Self-pay

## 2021-03-01 ENCOUNTER — Encounter: Payer: Self-pay | Admitting: Internal Medicine

## 2021-03-01 ENCOUNTER — Other Ambulatory Visit: Payer: Self-pay

## 2021-03-01 VITALS — BP 128/72 | HR 78 | Temp 98.4°F | Ht 68.0 in | Wt 181.2 lb

## 2021-03-01 DIAGNOSIS — I10 Essential (primary) hypertension: Secondary | ICD-10-CM

## 2021-03-01 DIAGNOSIS — N182 Chronic kidney disease, stage 2 (mild): Secondary | ICD-10-CM

## 2021-03-01 DIAGNOSIS — M545 Low back pain, unspecified: Secondary | ICD-10-CM | POA: Diagnosis not present

## 2021-03-01 DIAGNOSIS — G8929 Other chronic pain: Secondary | ICD-10-CM

## 2021-03-01 DIAGNOSIS — E1122 Type 2 diabetes mellitus with diabetic chronic kidney disease: Secondary | ICD-10-CM

## 2021-03-01 DIAGNOSIS — N183 Chronic kidney disease, stage 3 unspecified: Secondary | ICD-10-CM | POA: Diagnosis not present

## 2021-03-01 DIAGNOSIS — Z0000 Encounter for general adult medical examination without abnormal findings: Secondary | ICD-10-CM

## 2021-03-01 MED ORDER — HYDROCODONE-ACETAMINOPHEN 5-325 MG PO TABS
1.0000 | ORAL_TABLET | Freq: Four times a day (QID) | ORAL | 0 refills | Status: DC | PRN
  Filled 2021-03-01: qty 120, 30d supply, fill #0

## 2021-03-01 NOTE — Assessment & Plan Note (Signed)
In PT Norco prn  Potential benefits of a short and long term opioids use as well as potential risks (i.e. addiction risk, apnea etc) and complications (i.e. Somnolence, constipation and others) were explained to the patient and were aknowledged.

## 2021-03-01 NOTE — Progress Notes (Signed)
Subjective:  Patient ID: Douglas Richards, male    DOB: Feb 27, 1945  Age: 76 y.o. MRN: [id number]  CC: Follow-up (3 month f/u)   HPI Douglas Richards presents for LBP - had injections in Dec. In PT.  F/u HTN, DM, CRI  Outpatient Medications Prior to Visit  Medication Sig Dispense Refill   allopurinol (ZYLOPRIM) 100 MG tablet TAKE AS DIRECTED ONCE A DAY 90 tablet 3   amLODipine (NORVASC) 5 MG tablet TAKE 1 TABLET BY MOUTH DAILY. 90 tablet 2   ASPIRIN 81 PO Take by mouth.     Cholecalciferol (VITAMIN D3) 1000 UNITS tablet Take 1,000 Units by mouth daily.     Glucosamine-Chondroitin 750-600 MG TABS Take by mouth 2 (two) times daily. Reported on 04/28/2015     glucose blood (FREESTYLE LITE) test strip USE TO CHECK BLOOD SUGAR TWICE DAILY 100 strip 5   Lancets (FREESTYLE) lancets USE TO CHECK BLOOD SUGAR TWICE DAILY. Annual appt is due w/lab must see provider for future refills 100 each 5   loratadine (CLARITIN) 10 MG tablet Take 10 mg by mouth daily.     metFORMIN (GLUCOPHAGE) 500 MG tablet Take 1 tablet by mouth 2 (two) times daily with a meal. 180 tablet 3   methocarbamol (ROBAXIN) 500 MG tablet TAKE 1 TABLET BY MOUTH EVERY 8 HOURS AS NEEDED 30 tablet 1   MINERAL OIL PO Take by mouth.     omeprazole (PRILOSEC) 20 MG capsule TAKE 1 CAPSULE BY MOUTH ONCE DAILY 90 capsule 3   Psyllium (METAMUCIL PO) Take by mouth.     repaglinide (PRANDIN) 1 MG tablet TAKE 1 TABLET BY MOUTH THREE TIMES DAILY BEFORE MEALS. 270 tablet 3   rosuvastatin (CRESTOR) 10 MG tablet TAKE 1 TABLET BY MOUTH ONCE A DAY **DUE FOR ANNUAL APPOINTMENT** 90 tablet 3   Saw Palmetto, Serenoa repens, (SAW PALMETTO PO) Take by mouth.     tamsulosin (FLOMAX) 0.4 MG CAPS capsule TAKE 1 CAPSULE BY MOUTH ONCE DAILY 90 capsule 3   thiamine (VITAMIN B-1) 100 MG tablet Take 100 mg by mouth daily.     trandolapril (MAVIK) 4 MG tablet TAKE 1 TABLET BY MOUTH ONCE DAILY 90 tablet 3   HYDROcodone-acetaminophen (NORCO/VICODIN) 5-325 MG tablet  TAKE 1 TABLET BY MOUTH EVERY 6 HOURS AS NEEDED FOR SEVERE PAIN 120 tablet 0   No facility-administered medications prior to visit.    ROS: Review of Systems  Constitutional:  Positive for unexpected weight change. Negative for appetite change and fatigue.  HENT:  Negative for congestion, nosebleeds, sneezing, sore throat and trouble swallowing.   Eyes:  Negative for itching and visual disturbance.  Respiratory:  Negative for cough.   Cardiovascular:  Negative for chest pain, palpitations and leg swelling.  Gastrointestinal:  Negative for abdominal distention, blood in stool, diarrhea and nausea.  Genitourinary:  Negative for frequency and hematuria.  Musculoskeletal:  Positive for back pain. Negative for gait problem, joint swelling and neck pain.  Skin:  Negative for rash.  Neurological:  Negative for dizziness, tremors, speech difficulty and weakness.  Psychiatric/Behavioral:  Negative for agitation, dysphoric mood and sleep disturbance. The patient is not nervous/anxious.    Objective:  BP 128/72 (BP Location: Left Arm)    Pulse 78    Temp 98.4 F (36.9 C) (Oral)    Wt 181 lb 3.2 oz (82.2 kg)    SpO2 97%    BMI 27.55 kg/m   BP Readings from Last 3 Encounters:  03/01/21 128/72  03/01/21 128/72  11/25/20 (!) 141/80    Wt Readings from Last 3 Encounters:  03/01/21 181 lb 3.5 oz (82.2 kg)  03/01/21 181 lb 3.2 oz (82.2 kg)  11/25/20 186 lb 9.6 oz (84.6 kg)    Physical Exam Constitutional:      General: He is not in acute distress.    Appearance: He is well-developed.     Comments: NAD  Eyes:     Conjunctiva/sclera: Conjunctivae normal.     Pupils: Pupils are equal, round, and reactive to light.  Neck:     Thyroid: No thyromegaly.     Vascular: No JVD.  Cardiovascular:     Rate and Rhythm: Normal rate and regular rhythm.     Heart sounds: Normal heart sounds. No murmur heard.   No friction rub. No gallop.  Pulmonary:     Effort: Pulmonary effort is normal. No  respiratory distress.     Breath sounds: Normal breath sounds. No wheezing or rales.  Chest:     Chest wall: No tenderness.  Abdominal:     General: Bowel sounds are normal. There is no distension.     Palpations: Abdomen is soft. There is no mass.     Tenderness: There is no abdominal tenderness. There is no guarding or rebound.  Musculoskeletal:        General: No tenderness. Normal range of motion.     Cervical back: Normal range of motion.  Lymphadenopathy:     Cervical: No cervical adenopathy.  Skin:    General: Skin is warm and dry.     Findings: No rash.  Neurological:     Mental Status: He is alert and oriented to person, place, and time.     Cranial Nerves: No cranial nerve deficit.     Motor: No abnormal muscle tone.     Coordination: Coordination normal.     Gait: Gait normal.     Deep Tendon Reflexes: Reflexes are normal and symmetric.  Psychiatric:        Behavior: Behavior normal.        Thought Content: Thought content normal.        Judgment: Judgment normal.    Lab Results  Component Value Date   WBC 6.0 09/17/2015   HGB 13.5 09/17/2015   HCT 40.5 09/17/2015   PLT 151.0 09/17/2015   GLUCOSE 99 02/23/2021   CHOL 122 09/17/2015   TRIG 150.0 (H) 09/17/2015   HDL 40.30 09/17/2015   LDLDIRECT 105.5 09/10/2013   LDLCALC 51 09/17/2015   ALT 18 08/18/2020   AST 19 08/18/2020   NA 135 02/23/2021   K 4.5 02/23/2021   CL 99 02/23/2021   CREATININE 1.83 (H) 02/23/2021   BUN 23 02/23/2021   CO2 26 02/23/2021   TSH 2.65 09/17/2015   PSA 0.81 09/17/2015   HGBA1C 6.9 (H) 02/23/2021   MICROALBUR 0.4 03/31/2010    MR Lumbar Spine Wo Contrast  Result Date: 11/08/2020 CLINICAL DATA:  Persistent low back pain radiating down left leg since July EXAM: MRI LUMBAR SPINE WITHOUT CONTRAST TECHNIQUE: Multiplanar, multisequence MR imaging of the lumbar spine was performed. No intravenous contrast was administered. COMPARISON:  Lumbar spine radiographs 08/25/2020, CT  abdomen/pelvis 11/04/2014 FINDINGS: Segmentation: Standard; the lowest formed disc space is designated L5-S1 Alignment: There is straightening of the normal lumbar spine lordosis. There is trace retrolisthesis of L5 on S1, similar to the prior CT from 2016. There is no other anterior or retrolisthesis. Vertebrae: Vertebral body heights are  preserved. There is mild degenerative endplate marrow signal abnormality posteriorly at L3-L4. Otherwise, there is no focal marrow signal abnormality. Conus medullaris and cauda equina: Conus extends to the L1-L2 level. Conus and cauda equina appear normal. Paraspinal and other soft tissues: A small T2 hyperintense lesion in the right kidney is incompletely characterized but likely reflects a cyst. The paraspinal soft tissues are unremarkable. Disc levels: There is mild multilevel disc desiccation and narrowing. There is multilevel facet arthropathy, most advanced at L4-L5. There are associated trace facet joint effusions. T12-L1: No significant spinal canal or neural foraminal stenosis. L1-L2: There is a minimal disc protrusion without significant spinal canal or neural foraminal stenosis. L2-L3: There is a mild disc bulge mild ligamentum flavum thickening, and mild bilateral facet arthropathy without significant spinal canal or neural foraminal stenosis. L3-L4: There is a diffuse disc bulge with a superimposed superiorly migrated central disc extrusion, bulky ligamentum flavum thickening, and bilateral facet arthropathy resulting in mild-to-moderate spinal canal stenosis with narrowing of the bilateral subarticular zones, right worse than left, without definite evidence of frank nerve root impingement. There is mild right worse than left neural foraminal stenosis. L4-L5: There is a diffuse disc bulge, ligamentum flavum thickening, and bilateral facet arthropathy resulting in severe spinal canal stenosis with compression of the cauda equina nerve roots particularly involving the  traversing nerve roots in the right subarticular zone and mild to moderate right and mild left neural foraminal stenosis. L5-S1: There is a mild disc bulge and bilateral facet arthropathy resulting in mild crowding of the subarticular zones without evidence of nerve root impingement and mild to moderate right and mild left neural foraminal stenosis. IMPRESSION: 1. 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 Mild to moderate right and mild left neural foraminal stenosis at this level. 2. Diffuse disc bulge with a small superimposed superiorly migrated extrusion at L3-L4 resulting in mild-to-moderate spinal canal stenosis with crowding of the bilateral subarticular zones without definite evidence of nerve root impingement. Mild right worse than left neural foraminal stenosis at this level. 3. Mild-to-moderate right and mild left neural foraminal stenosis at L5-S1. 4. 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 Electronically Signed   By: Valetta Mole M.D.   On: 11/08/2020 10:18    Assessment & Plan:   Problem List Items Addressed This Visit     CRI (chronic renal insufficiency), stage 3 (moderate)    Hydrate well Avoid NSAIDs F/u w/Nephrology       DM2 (diabetes mellitus, type 2) (HCC)    Cont on Prandin      Essential hypertension    Continue on amlodipine, Mavik Pain control      Low back pain    In PT Norco prn  Potential benefits of a short and long term opioids use as well as potential risks (i.e. addiction risk, apnea etc) and complications (i.e. Somnolence, constipation and others) were explained to the patient and were aknowledged.      Relevant Medications   HYDROcodone-acetaminophen (NORCO/VICODIN) 5-325 MG tablet      Meds ordered  this encounter  Medications   HYDROcodone-acetaminophen (NORCO/VICODIN) 5-325 MG tablet    Sig: TAKE 1 TABLET BY MOUTH EVERY 6 HOURS AS NEEDED FOR SEVERE PAIN    Dispense:  120 tablet    Refill:  0  Follow-up: Return in about 3 months (around 05/30/2021) for a follow-up visit.  [name], MD

## 2021-03-01 NOTE — Assessment & Plan Note (Signed)
Cont on Prandin

## 2021-03-01 NOTE — Progress Notes (Addendum)
Subjective:   Douglas Richards is a 76 y.o. male who presents for Medicare Annual/Subsequent preventive examination.  Review of Systems     Cardiac Risk Factors include: advanced age (>67men, >65 women);diabetes mellitus;dyslipidemia;family history of premature cardiovascular disease;hypertension;male gender     Objective:    Today's Vitals   03/01/21 1613  BP: 128/72  Pulse: 78  Temp: 98.4 F (36.9 C)  SpO2: 97%  Weight: 181 lb 3.5 oz (82.2 kg)  Height: 5\' 8"  (1.727 m)   Body mass index is 27.55 kg/m.  Advanced Directives 03/01/2021 03/30/2020 02/27/2020 01/06/2019 11/29/2017 11/14/2016 04/28/2015  Does Patient Have a Medical Advance Directive? Yes Yes Yes Yes Yes Yes Yes  Type of Advance Directive Living will;Healthcare Power of [place];Living will - [place];Living will [place];Living will [place];Living will -  Does patient want to make changes to medical advance directive? No - Patient declined - No - Patient declined - - - -  Copy of [place] in Chart? No - copy requested - - No - copy requested No - copy requested No - copy requested Yes    Current Medications (verified) Outpatient Encounter Medications as of 03/01/2021  Medication Sig   allopurinol (ZYLOPRIM) 100 MG tablet TAKE AS DIRECTED ONCE A DAY   amLODipine (NORVASC) 5 MG tablet TAKE 1 TABLET BY MOUTH DAILY.   ASPIRIN 81 PO Take by mouth.   Cholecalciferol (VITAMIN D3) 1000 UNITS tablet Take 1,000 Units by mouth daily.   Glucosamine-Chondroitin 750-600 MG TABS Take by mouth 2 (two) times daily. Reported on 04/28/2015   glucose blood (FREESTYLE LITE) test strip USE TO CHECK BLOOD SUGAR TWICE DAILY   HYDROcodone-acetaminophen (NORCO/VICODIN) 5-325 MG tablet TAKE 1 TABLET BY MOUTH EVERY 6 HOURS AS NEEDED FOR SEVERE PAIN   Lancets (FREESTYLE) lancets USE TO CHECK BLOOD SUGAR TWICE DAILY. Annual appt is due w/lab  must see provider for future refills   loratadine (CLARITIN) 10 MG tablet Take 10 mg by mouth daily.   metFORMIN (GLUCOPHAGE) 500 MG tablet Take 1 tablet by mouth 2 (two) times daily with a meal.   methocarbamol (ROBAXIN) 500 MG tablet TAKE 1 TABLET BY MOUTH EVERY 8 HOURS AS NEEDED   MINERAL OIL PO Take by mouth.   omeprazole (PRILOSEC) 20 MG capsule TAKE 1 CAPSULE BY MOUTH ONCE DAILY   Psyllium (METAMUCIL PO) Take by mouth.   repaglinide (PRANDIN) 1 MG tablet TAKE 1 TABLET BY MOUTH THREE TIMES DAILY BEFORE MEALS.   rosuvastatin (CRESTOR) 10 MG tablet TAKE 1 TABLET BY MOUTH ONCE A DAY **DUE FOR ANNUAL APPOINTMENT**   Saw Palmetto, Serenoa repens, (SAW PALMETTO PO) Take by mouth.   tamsulosin (FLOMAX) 0.4 MG CAPS capsule TAKE 1 CAPSULE BY MOUTH ONCE DAILY   thiamine (VITAMIN B-1) 100 MG tablet Take 100 mg by mouth daily.   trandolapril (MAVIK) 4 MG tablet TAKE 1 TABLET BY MOUTH ONCE DAILY   No facility-administered encounter medications on file as of 03/01/2021.    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   Diverticulosis of colon (without mention of hemorrhage)    GERD (gastroesophageal reflux  disease)    Gout, unspecified    Herpes zoster without mention of complication    Hyperlipidemia    Hypertension    Osteoarthrosis, unspecified whether generalized or localized, unspecified site    Other abnormal glucose    Personal history of colonic polyps    Past Surgical History:  Procedure Laterality Date   CATARACT EXTRACTION Right    COLONOSCOPY  01/15/2014   HEMORRHOID SURGERY     LIPOMA EXCISION     from the neck   POLYPECTOMY     rotator cuff surgery Right    TONSILLECTOMY     Family History  Problem Relation  Age of Onset   COPD Mother    Stroke Mother    Hypertension Mother    Diabetes Father    COPD Father    Alcohol abuse Father    Stroke Father    Hypertension Father    Alcohol abuse Brother    Arthritis Brother        lupus   Heart disease Brother    Hypertension Other        fam hx   Heart disease Sister    Colon cancer Neg Hx    Colon polyps Neg Hx    Esophageal cancer Neg Hx    Stomach cancer Neg Hx    Rectal cancer Neg Hx    Social History   Socioeconomic History   Marital status: Married    Spouse name: Not on file   Number of children: 1   Years of education: Not on file   Highest education level: Not on file  Occupational History   Occupation: retired  Tobacco Use   Smoking status: Never   Smokeless tobacco: Never  Vaping Use   Vaping Use: Never used  Substance and Sexual Activity   Alcohol use: Not Currently    Alcohol/week: 0.0 standard drinks   Drug use: No   Sexual activity: Yes  Other Topics Concern   Not on file  Social History Narrative   Regular exercise- no.   Social Determinants of Health   Financial Resource Strain: Low Risk    Difficulty of Paying Living Expenses: Not hard at all  Food Insecurity: No Food Insecurity   Worried About Charity fundraiser in the Last Year: Never true   [place] in the Last Year: Never true  Transportation Needs: No Transportation Needs   Lack of Transportation (Medical): No   Lack of Transportation (Non-Medical): No  Physical Activity: Insufficiently Active   Days of Exercise per Week: 3 days   Minutes of Exercise per Session: 30 min  Stress: No Stress Concern Present   Feeling of Stress : Not at all  Social Connections: Socially Integrated   Frequency of Communication with Friends and Family: More than three times a week   Frequency of Social Gatherings with Friends and Family: Once a week   Attends Religious Services: More than 4 times per year   Active Member of [employer] or Organizations: No    Attends Music therapist: More than 4 times per year   Marital Status: Married    Tobacco Counseling Counseling given: Not Answered   Clinical Intake:  Pre-visit preparation completed: Yes        BMI - recorded: 27.55 Nutritional Status: BMI 25 -29 Overweight Nutritional Risks: None Diabetes: Yes CBG done?: No Did pt. bring in CBG monitor from home?: No  How often do you need to have someone help  you when you read instructions, pamphlets, or other written materials from your doctor or pharmacy?: 1 - Never What is the last grade level you completed in school?: [place]  Diabetic? yes  Interpreter Needed?: No  Information entered by :: Lisette Abu, LPN   Activities of Daily Living In your present state of health, do you have any difficulty performing the following activities: 03/01/2021  Hearing? N  Vision? N  Difficulty concentrating or making decisions? N  Walking or climbing stairs? N  Dressing or bathing? N  Doing errands, shopping? N  Preparing Food and eating ? N  Using the Toilet? N  In the past six months, have you accidently leaked urine? N  Do you have problems with loss of bowel control? N  Managing your Medications? N  Managing your Finances? N  Housekeeping or managing your Housekeeping? N  Some recent data might be hidden    Patient Care Team: Plotnikov, Evie Lacks, MD as PCP - General Darleen Crocker, MD as Consulting Physician (Ophthalmology) Kristeen Miss, MD as Consulting Physician (Neurosurgery)  Indicate any recent Medical Services you may have received from other than [facility] providers in the past year (date may be approximate).     Assessment:   This is a routine wellness examination for Kasir.  Hearing/Vision screen Hearing Screening - Comments:: Patient denied any hearing difficulty.   No hearing aids.  Vision Screening - Comments:: Patient wears corrective glasses/contacts.  Eye exam done annually by: Dr.  Darleen Crocker  Dietary issues and exercise activities discussed: Current Exercise Habits: Home exercise routine, Type of exercise: treadmill;Other - see comments (Physical Therapy for his back), Time (Minutes): 30, Frequency (Times/Week): 5, Weekly Exercise (Minutes/Week): 150, Intensity: Mild, Exercise limited by: neurologic condition(s);orthopedic condition(s)   Goals Addressed               This Visit's Progress     Patient Stated (pt-stated)        My goal is to get my back right.      Depression Screen PHQ 2/9 Scores 03/01/2021 02/27/2020 01/06/2019 11/29/2017 11/14/2016 09/29/2016 04/28/2015  PHQ - 2 Score 0 0 0 0 0 0 0  PHQ- 9 Score 0 - - - 0 - -    Fall Risk Fall Risk  03/01/2021 02/27/2020 01/06/2019 12/30/2018 11/29/2017  Falls in the past year? 0 0 0 0 Yes  Comment - - - Emmi Telephone Survey: data to providers prior to load -  Number falls in past yr: 0 0 0 - 1  Injury with Fall? 0 0 0 - No  Risk for fall due to : No Fall Risks No Fall Risks - - -  Follow up - Falls evaluation completed - - Falls prevention discussed    FALL RISK PREVENTION PERTAINING TO THE HOME:  Any stairs in or around the home?  no If so, are there any without handrails? No  Home free of loose throw rugs in walkways, pet beds, electrical cords, etc? Yes  Adequate lighting in your home to reduce risk of falls? Yes   ASSISTIVE DEVICES UTILIZED TO PREVENT FALLS:  Life alert? No  Use of a cane, walker or w/c? No  Grab bars in the bathroom? Yes  Shower chair or bench in shower? Yes  Elevated toilet seat or a handicapped toilet? Yes   TIMED UP AND GO:  Was the test performed? Yes .  Length of time to ambulate 10 feet: 6 sec.   Gait steady and fast without  use of assistive device  Cognitive Function: Normal cognitive status assessed by direct observation by this Nurse Health Advisor. No abnormalities found.   MMSE - Mini Mental State Exam 11/14/2016 04/28/2015  Not completed: - (No Data)   Orientation to time 5 -  Orientation to Place 5 -  Registration 3 -  Attention/ Calculation 5 -  Recall 3 -  Language- name 2 objects 2 -  Language- repeat 1 -  Language- follow 3 step command 3 -  Language- read & follow direction 1 -  Write a sentence 1 -  Copy design 1 -  Total score 30 -        Immunizations Immunization History  Administered Date(s) Administered   Fluad Quad(high Dose 65+) 10/18/2018, 10/22/2019, 11/25/2020   Influenza Whole 11/28/2005, 11/27/2008   Influenza, High Dose Seasonal PF 11/09/2015, 09/29/2016, 11/29/2017   Influenza,inj,Quad PF,6+ Mos 10/28/2013, 10/21/2014   Influenza-Unspecified 01/06/2013   PFIZER(Purple Top)SARS-COV-2 Vaccination 03/15/2019, 04/05/2019, 11/06/2019, 06/24/2020   Pneumococcal Conjugate-13 01/13/2014   Pneumococcal Polysaccharide-23 06/07/2011   Td 07/23/2009   Tdap 10/22/2019    TDAP status: Up to date  Flu Vaccine status: Up to date  Pneumococcal vaccine status: Up to date  Covid-19 vaccine status: Completed vaccines  Qualifies for Shingles Vaccine? Yes   Zostavax completed No   Shingrix Completed?: No.    Education has been provided regarding the importance of this vaccine. Patient has been advised to call insurance company to determine out of pocket expense if they have not yet received this vaccine. Advised may also receive vaccine at local pharmacy or Health Dept. Verbalized acceptance and understanding.  Screening Tests Health Maintenance  Topic Date Due   Zoster Vaccines- Shingrix (1 of 2) Never done   OPHTHALMOLOGY EXAM  09/19/2018   COVID-19 Vaccine (5 - Booster for Pfizer series) 08/19/2020   FOOT EXAM  05/26/2021   COLONOSCOPY (Pts 45-1yrs Insurance coverage will need to be confirmed)  08/04/2021   HEMOGLOBIN A1C  08/23/2021   TETANUS/TDAP  10/21/2029   Pneumonia Vaccine 92+ Years old  Completed   INFLUENZA VACCINE  Completed   Hepatitis C Screening  Completed   HPV VACCINES  Aged Out     Health Maintenance  Health Maintenance Due  Topic Date Due   Zoster Vaccines- Shingrix (1 of 2) Never done   OPHTHALMOLOGY EXAM  09/19/2018   COVID-19 Vaccine (5 - Booster for Cucumber series) 08/19/2020    Colorectal cancer screening: Type of screening: Colonoscopy. Completed 08/04/2020. Repeat every 1 years  Lung Cancer Screening: (Low Dose CT Chest recommended if Age 56-80 years, 30 pack-year currently smoking OR have quit w/in 15years.) does not qualify.   Lung Cancer Screening Referral: no  Additional Screening:  Hepatitis C Screening: does qualify; Completed yes  Vision Screening: Recommended annual ophthalmology exams for early detection of glaucoma and other disorders of the eye. Is the patient up to date with their annual eye exam?  Yes  Who is the provider or what is the name of the office in which the patient attends annual eye exams? Darleen Crocker, MD. If pt is not established with a provider, would they like to be referred to a provider to establish care? No .   Dental Screening: Recommended annual dental exams for proper oral hygiene  Community Resource Referral / Chronic Care Management: CRR required this visit?  No   CCM required this visit?  No      Plan:     I have personally reviewed  and noted the following in the patients chart:   Medical and social history Use of alcohol, tobacco or illicit drugs  Current medications and supplements including opioid prescriptions. Patient is currently taking opioid prescriptions. Information provided to patient regarding non-opioid alternatives. Patient advised to discuss non-opioid treatment plan with their provider. Functional ability and status Nutritional status Physical activity Advanced directives List of other physicians Hospitalizations, surgeries, and ER visits in previous 12 months Vitals Screenings to include cognitive, depression, and falls Referrals and appointments  In addition, I have reviewed  and discussed with patient certain preventive protocols, quality metrics, and best practice recommendations. A written personalized care plan for preventive services as well as general preventive health recommendations were provided to patient.     Sheral Flow, LPN   08/12/6149   Nurse Notes:  Hearing Screening - Comments:: Patient denied any hearing difficulty.   No hearing aids.  Vision Screening - Comments:: Patient wears corrective glasses/contacts.  Eye exam done annually by: Dr. Darleen Crocker    Medical screening examination/treatment/procedure(s) were performed by non-physician practitioner and as supervising physician I was immediately available for consultation/collaboration.  I agree with above. Lew Dawes, MD

## 2021-03-01 NOTE — Assessment & Plan Note (Signed)
Hydrate well Avoid NSAIDs F/u w/Nephrology

## 2021-03-06 ENCOUNTER — Other Ambulatory Visit (HOSPITAL_COMMUNITY): Payer: Self-pay

## 2021-03-08 ENCOUNTER — Other Ambulatory Visit (HOSPITAL_COMMUNITY): Payer: Self-pay

## 2021-03-08 DIAGNOSIS — M48062 Spinal stenosis, lumbar region with neurogenic claudication: Secondary | ICD-10-CM | POA: Diagnosis not present

## 2021-03-08 DIAGNOSIS — M5459 Other low back pain: Secondary | ICD-10-CM | POA: Diagnosis not present

## 2021-03-09 DIAGNOSIS — N1832 Chronic kidney disease, stage 3b: Secondary | ICD-10-CM | POA: Diagnosis not present

## 2021-03-09 DIAGNOSIS — D631 Anemia in chronic kidney disease: Secondary | ICD-10-CM | POA: Diagnosis not present

## 2021-03-09 DIAGNOSIS — E785 Hyperlipidemia, unspecified: Secondary | ICD-10-CM | POA: Diagnosis not present

## 2021-03-09 DIAGNOSIS — I129 Hypertensive chronic kidney disease with stage 1 through stage 4 chronic kidney disease, or unspecified chronic kidney disease: Secondary | ICD-10-CM | POA: Diagnosis not present

## 2021-03-09 DIAGNOSIS — N2581 Secondary hyperparathyroidism of renal origin: Secondary | ICD-10-CM | POA: Diagnosis not present

## 2021-03-14 NOTE — Assessment & Plan Note (Signed)
Continue on amlodipine, [facility]

## 2021-03-22 DIAGNOSIS — M48062 Spinal stenosis, lumbar region with neurogenic claudication: Secondary | ICD-10-CM | POA: Diagnosis not present

## 2021-03-22 DIAGNOSIS — M5459 Other low back pain: Secondary | ICD-10-CM | POA: Diagnosis not present

## 2021-04-03 ENCOUNTER — Other Ambulatory Visit (HOSPITAL_COMMUNITY): Payer: Self-pay

## 2021-04-04 ENCOUNTER — Other Ambulatory Visit (HOSPITAL_COMMUNITY): Payer: Self-pay

## 2021-04-05 ENCOUNTER — Other Ambulatory Visit (HOSPITAL_COMMUNITY): Payer: Self-pay

## 2021-04-05 DIAGNOSIS — M48062 Spinal stenosis, lumbar region with neurogenic claudication: Secondary | ICD-10-CM | POA: Diagnosis not present

## 2021-04-05 DIAGNOSIS — M5459 Other low back pain: Secondary | ICD-10-CM | POA: Diagnosis not present

## 2021-04-25 ENCOUNTER — Other Ambulatory Visit (HOSPITAL_COMMUNITY): Payer: Self-pay

## 2021-05-08 ENCOUNTER — Other Ambulatory Visit (HOSPITAL_COMMUNITY): Payer: Self-pay

## 2021-05-08 MED FILL — Tamsulosin HCl Cap 0.4 MG: ORAL | 90 days supply | Qty: 90 | Fill #2 | Status: AC

## 2021-05-09 ENCOUNTER — Other Ambulatory Visit (HOSPITAL_COMMUNITY): Payer: Self-pay

## 2021-05-19 DIAGNOSIS — M5416 Radiculopathy, lumbar region: Secondary | ICD-10-CM | POA: Diagnosis not present

## 2021-05-19 DIAGNOSIS — M5116 Intervertebral disc disorders with radiculopathy, lumbar region: Secondary | ICD-10-CM | POA: Diagnosis not present

## 2021-05-26 ENCOUNTER — Other Ambulatory Visit (INDEPENDENT_AMBULATORY_CARE_PROVIDER_SITE_OTHER): Payer: PPO

## 2021-05-26 DIAGNOSIS — E118 Type 2 diabetes mellitus with unspecified complications: Secondary | ICD-10-CM

## 2021-05-26 LAB — HEMOGLOBIN A1C: Hgb A1c MFr Bld: 6.8 % — ABNORMAL HIGH (ref 4.6–6.5)

## 2021-05-26 LAB — BASIC METABOLIC PANEL
BUN: 40 mg/dL — ABNORMAL HIGH (ref 6–23)
CO2: 21 mEq/L (ref 19–32)
Calcium: 9 mg/dL (ref 8.4–10.5)
Chloride: 99 mEq/L (ref 96–112)
Creatinine, Ser: 1.78 mg/dL — ABNORMAL HIGH (ref 0.40–1.50)
GFR: 36.82 mL/min — ABNORMAL LOW (ref >60.00)
Glucose, Bld: 129 mg/dL — ABNORMAL HIGH (ref 70–99)
Potassium: 4.3 mEq/L (ref 3.5–5.1)
Sodium: 131 mEq/L — ABNORMAL LOW (ref 135–145)

## 2021-06-01 ENCOUNTER — Other Ambulatory Visit (HOSPITAL_COMMUNITY): Payer: Self-pay

## 2021-06-01 ENCOUNTER — Encounter: Payer: Self-pay | Admitting: Internal Medicine

## 2021-06-01 ENCOUNTER — Ambulatory Visit (INDEPENDENT_AMBULATORY_CARE_PROVIDER_SITE_OTHER): Payer: PPO | Admitting: Internal Medicine

## 2021-06-01 DIAGNOSIS — N183 Chronic kidney disease, stage 3 unspecified: Secondary | ICD-10-CM

## 2021-06-01 DIAGNOSIS — G8929 Other chronic pain: Secondary | ICD-10-CM | POA: Diagnosis not present

## 2021-06-01 DIAGNOSIS — M545 Low back pain, unspecified: Secondary | ICD-10-CM | POA: Diagnosis not present

## 2021-06-01 DIAGNOSIS — M1990 Unspecified osteoarthritis, unspecified site: Secondary | ICD-10-CM | POA: Diagnosis not present

## 2021-06-01 DIAGNOSIS — N400 Enlarged prostate without lower urinary tract symptoms: Secondary | ICD-10-CM | POA: Diagnosis not present

## 2021-06-01 MED ORDER — HYDROCODONE-ACETAMINOPHEN 5-325 MG PO TABS
1.0000 | ORAL_TABLET | Freq: Four times a day (QID) | ORAL | 0 refills | Status: DC | PRN
Start: 2021-06-01 — End: 2021-07-18
  Filled 2021-06-01: qty 120, 30d supply, fill #0

## 2021-06-01 NOTE — Assessment & Plan Note (Addendum)
Monitor GFR ?Treat BPH ?Hydrate well ?

## 2021-06-01 NOTE — Patient Instructions (Signed)
Blue-Emu cream -- use 2-3 times a day ? ?

## 2021-06-01 NOTE — Progress Notes (Signed)
? ?Subjective:  ?Patient ID: Douglas Richards, male    DOB: [birth year]  Age: 76 y.o. MRN: [id number] ? ?CC: No chief complaint on file. ? ? ?HPI ?Douglas Richards presents for LBP, HTN, CRI and BPH ? ?Outpatient Medications Prior to Visit  ?Medication Sig Dispense Refill  ? allopurinol (ZYLOPRIM) 100 MG tablet TAKE AS DIRECTED ONCE A DAY 90 tablet 3  ? amLODipine (NORVASC) 5 MG tablet TAKE 1 TABLET BY MOUTH DAILY. 90 tablet 2  ? ASPIRIN 81 PO Take by mouth.    ? Cholecalciferol (VITAMIN D3) 1000 UNITS tablet Take 1,000 Units by mouth daily.    ? Glucosamine-Chondroitin 750-600 MG TABS Take by mouth 2 (two) times daily. Reported on 04/28/2015    ? glucose blood (FREESTYLE LITE) test strip USE TO CHECK BLOOD SUGAR TWICE DAILY 100 strip 5  ? Lancets (FREESTYLE) lancets USE TO CHECK BLOOD SUGAR TWICE DAILY. Annual appt is due w/lab must see provider for future refills 100 each 5  ? loratadine (CLARITIN) 10 MG tablet Take 10 mg by mouth daily.    ? metFORMIN (GLUCOPHAGE) 500 MG tablet Take 1 tablet by mouth 2 (two) times daily with a meal. 180 tablet 3  ? methocarbamol (ROBAXIN) 500 MG tablet TAKE 1 TABLET BY MOUTH EVERY 8 HOURS AS NEEDED 30 tablet 1  ? MINERAL OIL PO Take by mouth.    ? omeprazole (PRILOSEC) 20 MG capsule TAKE 1 CAPSULE BY MOUTH ONCE DAILY 90 capsule 3  ? Psyllium (METAMUCIL PO) Take by mouth.    ? repaglinide (PRANDIN) 1 MG tablet TAKE 1 TABLET BY MOUTH THREE TIMES DAILY BEFORE MEALS. 270 tablet 3  ? rosuvastatin (CRESTOR) 10 MG tablet TAKE 1 TABLET BY MOUTH ONCE A DAY **DUE FOR ANNUAL APPOINTMENT** 90 tablet 3  ? Saw Palmetto, Serenoa repens, (SAW PALMETTO PO) Take by mouth.    ? tamsulosin (FLOMAX) 0.4 MG CAPS capsule TAKE 1 CAPSULE BY MOUTH ONCE DAILY 90 capsule 3  ? thiamine (VITAMIN B-1) 100 MG tablet Take 100 mg by mouth daily.    ? trandolapril (MAVIK) 4 MG tablet TAKE 1 TABLET BY MOUTH ONCE DAILY 90 tablet 3  ? HYDROcodone-acetaminophen (NORCO/VICODIN) 5-325 MG tablet TAKE 1 TABLET BY MOUTH EVERY 6  HOURS AS NEEDED FOR SEVERE PAIN 120 tablet 0  ? ?No facility-administered medications prior to visit.  ? ? ?ROS: ?Review of Systems  ?Constitutional:  Negative for appetite change, fatigue and unexpected weight change.  ?HENT:  Negative for congestion, nosebleeds, sneezing, sore throat and trouble swallowing.   ?Eyes:  Negative for itching and visual disturbance.  ?Respiratory:  Negative for cough.   ?Cardiovascular:  Negative for chest pain, palpitations and leg swelling.  ?Gastrointestinal:  Negative for abdominal distention, blood in stool, diarrhea and nausea.  ?Genitourinary:  Negative for frequency and hematuria.  ?Musculoskeletal:  Positive for back pain. Negative for gait problem, joint swelling and neck pain.  ?Skin:  Negative for rash.  ?Neurological:  Negative for dizziness, tremors, speech difficulty and weakness.  ?Psychiatric/Behavioral:  Negative for agitation, dysphoric mood and sleep disturbance. The patient is not nervous/anxious.   ? ?Objective:  ?BP 130/78 (BP Location: Left Arm, Patient Position: Sitting, Cuff Size: Normal)   Pulse 73   Temp 98.2 ?F (36.8 ?C) (Oral)   Ht '5\' 8"'$  (1.727 m)   Wt 181 lb (82.1 kg)   SpO2 98%   BMI 27.52 kg/m?  ? ?BP Readings from Last 3 Encounters:  ?06/01/21 130/78  ?03/01/21 128/72  ?  03/01/21 128/72  ? ? ?Wt Readings from Last 3 Encounters:  ?06/01/21 181 lb (82.1 kg)  ?03/01/21 181 lb 3.5 oz (82.2 kg)  ?03/01/21 181 lb 3.2 oz (82.2 kg)  ? ? ?Physical Exam ?Constitutional:   ?   General: He is not in acute distress. ?   Appearance: He is well-developed.  ?   Comments: NAD  ?Eyes:  ?   Conjunctiva/sclera: Conjunctivae normal.  ?   Pupils: Pupils are equal, round, and reactive to light.  ?Neck:  ?   Thyroid: No thyromegaly.  ?   Vascular: No JVD.  ?Cardiovascular:  ?   Rate and Rhythm: Normal rate and regular rhythm.  ?   Heart sounds: Normal heart sounds. No murmur heard. ?  No friction rub. No gallop.  ?Pulmonary:  ?   Effort: Pulmonary effort is normal. No  respiratory distress.  ?   Breath sounds: Normal breath sounds. No wheezing or rales.  ?Chest:  ?   Chest wall: No tenderness.  ?Abdominal:  ?   General: Bowel sounds are normal. There is no distension.  ?   Palpations: Abdomen is soft. There is no mass.  ?   Tenderness: There is no abdominal tenderness. There is no guarding or rebound.  ?Musculoskeletal:     ?   General: No tenderness. Normal range of motion.  ?   Cervical back: Normal range of motion.  ?Lymphadenopathy:  ?   Cervical: No cervical adenopathy.  ?Skin: ?   General: Skin is warm and dry.  ?   Findings: No rash.  ?Neurological:  ?   Mental Status: He is alert and oriented to person, place, and time.  ?   Cranial Nerves: No cranial nerve deficit.  ?   Motor: No abnormal muscle tone.  ?   Coordination: Coordination normal.  ?   Gait: Gait normal.  ?   Deep Tendon Reflexes: Reflexes are normal and symmetric.  ?Psychiatric:     ?   Behavior: Behavior normal.     ?   Thought Content: Thought content normal.     ?   Judgment: Judgment normal.  ? ? ?Lab Results  ?Component Value Date  ? WBC 6.0 09/17/2015  ? HGB 13.5 09/17/2015  ? HCT 40.5 09/17/2015  ? PLT 151.0 09/17/2015  ? GLUCOSE 129 (H) 05/26/2021  ? CHOL 122 09/17/2015  ? TRIG 150.0 (H) 09/17/2015  ? HDL 40.30 09/17/2015  ? LDLDIRECT 105.5 09/10/2013  ? [place] 51 09/17/2015  ? ALT 18 08/18/2020  ? AST 19 08/18/2020  ? NA 131 (L) 05/26/2021  ? K 4.3 05/26/2021  ? CL 99 05/26/2021  ? CREATININE 1.78 (H) 05/26/2021  ? BUN 40 (H) 05/26/2021  ? CO2 21 05/26/2021  ? TSH 2.65 09/17/2015  ? PSA 0.81 09/17/2015  ? HGBA1C 6.8 (H) 05/26/2021  ? MICROALBUR 0.4 03/31/2010  ? ? ?MR Lumbar Spine Wo Contrast ? ?Result Date: 11/08/2020 ?CLINICAL DATA:  Persistent low back pain radiating down left leg since July EXAM: MRI LUMBAR SPINE WITHOUT CONTRAST TECHNIQUE: Multiplanar, multisequence MR imaging of the lumbar spine was performed. No intravenous contrast was administered. COMPARISON:  Lumbar spine radiographs  08/25/2020, CT abdomen/pelvis 11/04/2014 FINDINGS: Segmentation: Standard; the lowest formed disc space is designated L5-S1 Alignment: There is straightening of the normal lumbar spine lordosis. There is trace retrolisthesis of L5 on S1, similar to the prior CT from 2016. There is no other anterior or retrolisthesis. Vertebrae: Vertebral body heights are preserved. There is  mild degenerative endplate marrow signal abnormality posteriorly at L3-L4. Otherwise, there is no focal marrow signal abnormality. Conus medullaris and cauda equina: Conus extends to the L1-L2 level. Conus and cauda equina appear normal. Paraspinal and other soft tissues: A small T2 hyperintense lesion in the right kidney is incompletely characterized but likely reflects a cyst. The paraspinal soft tissues are unremarkable. Disc levels: There is mild multilevel disc desiccation and narrowing. There is multilevel facet arthropathy, most advanced at L4-L5. There are associated trace facet joint effusions. T12-L1: No significant spinal canal or neural foraminal stenosis. L1-L2: There is a minimal disc protrusion without significant spinal canal or neural foraminal stenosis. L2-L3: There is a mild disc bulge mild ligamentum flavum thickening, and mild bilateral facet arthropathy without significant spinal canal or neural foraminal stenosis. L3-L4: There is a diffuse disc bulge with a superimposed superiorly migrated central disc extrusion, bulky ligamentum flavum thickening, and bilateral facet arthropathy resulting in mild-to-moderate spinal canal stenosis with narrowing of the bilateral subarticular zones, right worse than left, without definite evidence of frank nerve root impingement. There is mild right worse than left neural foraminal stenosis. L4-L5: There is a diffuse disc bulge, ligamentum flavum thickening, and bilateral facet arthropathy resulting in severe spinal canal stenosis with compression of the cauda equina nerve roots particularly  involving the traversing nerve roots in the right subarticular zone and mild to moderate right and mild left neural foraminal stenosis. L5-S1: There is a mild disc bulge and bilateral facet arthropathy resulting in

## 2021-06-01 NOTE — Assessment & Plan Note (Addendum)
Blue-Emu cream was recommended to use 2-3 times a day ?Norco prn ? Potential benefits of a long term opioids use as well as potential risks (i.e. addiction risk, apnea etc) and complications (i.e. Somnolence, constipation and others) were explained to the patient and were aknowledged. ? ? ?

## 2021-06-01 NOTE — Assessment & Plan Note (Addendum)
Norco prn ? Potential benefits of a long term opioids use as well as potential risks (i.e. addiction risk, apnea etc) and complications (i.e. Somnolence, constipation and others) were explained to the patient and were aknowledged. ?NS appt is pending ?

## 2021-06-02 ENCOUNTER — Other Ambulatory Visit (HOSPITAL_COMMUNITY): Payer: Self-pay

## 2021-06-04 NOTE — Assessment & Plan Note (Signed)
Cont on Flomax ?

## 2021-06-22 DIAGNOSIS — Z6827 Body mass index (BMI) 27.0-27.9, adult: Secondary | ICD-10-CM | POA: Diagnosis not present

## 2021-06-22 DIAGNOSIS — M48062 Spinal stenosis, lumbar region with neurogenic claudication: Secondary | ICD-10-CM | POA: Diagnosis not present

## 2021-06-26 ENCOUNTER — Other Ambulatory Visit: Payer: Self-pay | Admitting: Internal Medicine

## 2021-06-27 ENCOUNTER — Other Ambulatory Visit (HOSPITAL_COMMUNITY): Payer: Self-pay

## 2021-06-27 MED ORDER — TRANDOLAPRIL 4 MG PO TABS
ORAL_TABLET | Freq: Every day | ORAL | 3 refills | Status: DC
  Filled 2021-06-27: qty 90, 90d supply, fill #0
  Filled 2021-10-09: qty 90, 90d supply, fill #1
  Filled 2022-01-08: qty 90, 90d supply, fill #2
  Filled 2022-04-09: qty 90, 90d supply, fill #3

## 2021-06-27 MED ORDER — AMLODIPINE BESYLATE 5 MG PO TABS
ORAL_TABLET | Freq: Every day | ORAL | 3 refills | Status: DC
  Filled 2021-06-27: qty 90, 90d supply, fill #0
  Filled 2021-10-09: qty 90, 90d supply, fill #1
  Filled 2022-01-08: qty 90, 90d supply, fill #2
  Filled 2022-04-09: qty 90, 90d supply, fill #3

## 2021-06-28 ENCOUNTER — Other Ambulatory Visit (HOSPITAL_COMMUNITY): Payer: Self-pay

## 2021-07-18 ENCOUNTER — Other Ambulatory Visit (HOSPITAL_COMMUNITY): Payer: Self-pay

## 2021-07-18 ENCOUNTER — Other Ambulatory Visit: Payer: Self-pay | Admitting: Internal Medicine

## 2021-07-18 NOTE — Telephone Encounter (Signed)
Check [place] registry last filled 4/26/202.Marland KitchenChryl Heck

## 2021-07-20 ENCOUNTER — Other Ambulatory Visit (HOSPITAL_COMMUNITY): Payer: Self-pay

## 2021-07-20 MED ORDER — HYDROCODONE-ACETAMINOPHEN 5-325 MG PO TABS
1.0000 | ORAL_TABLET | Freq: Four times a day (QID) | ORAL | 0 refills | Status: DC | PRN
Start: 2021-07-20 — End: 2021-09-07
  Filled 2021-07-20: qty 120, 30d supply, fill #0

## 2021-07-24 ENCOUNTER — Other Ambulatory Visit: Payer: Self-pay | Admitting: Internal Medicine

## 2021-07-25 ENCOUNTER — Other Ambulatory Visit (HOSPITAL_COMMUNITY): Payer: Self-pay

## 2021-07-25 MED ORDER — METFORMIN HCL 500 MG PO TABS
ORAL_TABLET | Freq: Two times a day (BID) | ORAL | 3 refills | Status: DC
  Filled 2021-07-25: qty 180, 90d supply, fill #0
  Filled 2021-10-16: qty 180, 90d supply, fill #1
  Filled 2022-01-08: qty 180, 90d supply, fill #2
  Filled 2022-04-16: qty 180, 90d supply, fill #3

## 2021-07-26 DIAGNOSIS — N1832 Chronic kidney disease, stage 3b: Secondary | ICD-10-CM | POA: Diagnosis not present

## 2021-08-01 ENCOUNTER — Other Ambulatory Visit (HOSPITAL_COMMUNITY): Payer: Self-pay

## 2021-08-01 DIAGNOSIS — N1831 Chronic kidney disease, stage 3a: Secondary | ICD-10-CM | POA: Diagnosis not present

## 2021-08-01 DIAGNOSIS — N2581 Secondary hyperparathyroidism of renal origin: Secondary | ICD-10-CM | POA: Diagnosis not present

## 2021-08-01 DIAGNOSIS — D631 Anemia in chronic kidney disease: Secondary | ICD-10-CM | POA: Diagnosis not present

## 2021-08-01 DIAGNOSIS — I129 Hypertensive chronic kidney disease with stage 1 through stage 4 chronic kidney disease, or unspecified chronic kidney disease: Secondary | ICD-10-CM | POA: Diagnosis not present

## 2021-08-15 ENCOUNTER — Other Ambulatory Visit (HOSPITAL_COMMUNITY): Payer: Self-pay

## 2021-08-15 MED FILL — Tamsulosin HCl Cap 0.4 MG: ORAL | 90 days supply | Qty: 90 | Fill #3 | Status: AC

## 2021-08-25 ENCOUNTER — Encounter: Payer: Self-pay | Admitting: Internal Medicine

## 2021-08-25 DIAGNOSIS — M5116 Intervertebral disc disorders with radiculopathy, lumbar region: Secondary | ICD-10-CM | POA: Diagnosis not present

## 2021-08-25 DIAGNOSIS — M5416 Radiculopathy, lumbar region: Secondary | ICD-10-CM | POA: Diagnosis not present

## 2021-09-01 ENCOUNTER — Other Ambulatory Visit (INDEPENDENT_AMBULATORY_CARE_PROVIDER_SITE_OTHER): Payer: PPO

## 2021-09-01 DIAGNOSIS — N183 Chronic kidney disease, stage 3 unspecified: Secondary | ICD-10-CM | POA: Diagnosis not present

## 2021-09-01 DIAGNOSIS — M1990 Unspecified osteoarthritis, unspecified site: Secondary | ICD-10-CM | POA: Diagnosis not present

## 2021-09-01 LAB — COMPREHENSIVE METABOLIC PANEL
ALT: 20 U/L (ref 0–53)
AST: 13 U/L (ref 0–37)
Albumin: 4.5 g/dL (ref 3.5–5.2)
Alkaline Phosphatase: 33 U/L — ABNORMAL LOW (ref 39–117)
BUN: 38 mg/dL — ABNORMAL HIGH (ref 6–23)
CO2: 25 mEq/L (ref 19–32)
Calcium: 10 mg/dL (ref 8.4–10.5)
Chloride: 97 mEq/L (ref 96–112)
Creatinine, Ser: 1.69 mg/dL — ABNORMAL HIGH (ref 0.40–1.50)
GFR: 39.11 mL/min — ABNORMAL LOW (ref >60.00)
Glucose, Bld: 154 mg/dL — ABNORMAL HIGH (ref 70–99)
Potassium: 5.3 mEq/L — ABNORMAL HIGH (ref 3.5–5.1)
Sodium: 131 mEq/L — ABNORMAL LOW (ref 135–145)
Total Bilirubin: 0.7 mg/dL (ref 0.2–1.2)
Total Protein: 7.1 g/dL (ref 6.0–8.3)

## 2021-09-01 LAB — HEMOGLOBIN A1C: Hgb A1c MFr Bld: 6.8 % — ABNORMAL HIGH (ref 4.6–6.5)

## 2021-09-05 ENCOUNTER — Other Ambulatory Visit (HOSPITAL_COMMUNITY): Payer: Self-pay

## 2021-09-06 ENCOUNTER — Other Ambulatory Visit (HOSPITAL_COMMUNITY): Payer: Self-pay

## 2021-09-07 ENCOUNTER — Other Ambulatory Visit (HOSPITAL_COMMUNITY): Payer: Self-pay

## 2021-09-07 ENCOUNTER — Ambulatory Visit (INDEPENDENT_AMBULATORY_CARE_PROVIDER_SITE_OTHER): Payer: PPO | Admitting: Internal Medicine

## 2021-09-07 ENCOUNTER — Encounter: Payer: Self-pay | Admitting: Internal Medicine

## 2021-09-07 VITALS — BP 110/60 | HR 84 | Temp 98.4°F | Ht 68.0 in | Wt 177.0 lb

## 2021-09-07 DIAGNOSIS — M545 Low back pain, unspecified: Secondary | ICD-10-CM

## 2021-09-07 DIAGNOSIS — N182 Chronic kidney disease, stage 2 (mild): Secondary | ICD-10-CM

## 2021-09-07 DIAGNOSIS — E1122 Type 2 diabetes mellitus with diabetic chronic kidney disease: Secondary | ICD-10-CM | POA: Diagnosis not present

## 2021-09-07 DIAGNOSIS — M1000 Idiopathic gout, unspecified site: Secondary | ICD-10-CM | POA: Diagnosis not present

## 2021-09-07 DIAGNOSIS — N183 Chronic kidney disease, stage 3 unspecified: Secondary | ICD-10-CM | POA: Diagnosis not present

## 2021-09-07 DIAGNOSIS — G8929 Other chronic pain: Secondary | ICD-10-CM | POA: Diagnosis not present

## 2021-09-07 DIAGNOSIS — L570 Actinic keratosis: Secondary | ICD-10-CM | POA: Diagnosis not present

## 2021-09-07 MED ORDER — ALLOPURINOL 100 MG PO TABS
50.0000 mg | ORAL_TABLET | Freq: Every day | ORAL | 1 refills | Status: DC
  Filled 2021-09-07: qty 90, 180d supply, fill #0
  Filled 2021-11-06: qty 45, 90d supply, fill #0
  Filled 2022-02-05: qty 45, 90d supply, fill #1
  Filled 2022-05-14: qty 45, 90d supply, fill #2
  Filled 2022-08-08: qty 45, 90d supply, fill #3

## 2021-09-07 MED ORDER — HYDROCODONE-ACETAMINOPHEN 5-325 MG PO TABS: 1.0000 | ORAL_TABLET | Freq: Four times a day (QID) | ORAL | 0 refills | Status: DC | PRN

## 2021-09-07 MED ORDER — HYDROCODONE-ACETAMINOPHEN 5-325 MG PO TABS
1.0000 | ORAL_TABLET | Freq: Four times a day (QID) | ORAL | 0 refills | Status: DC | PRN
  Filled 2021-09-07: qty 120, 30d supply, fill #0

## 2021-09-07 NOTE — Assessment & Plan Note (Signed)
See Cryo 

## 2021-09-07 NOTE — Progress Notes (Signed)
Subjective:  Patient ID: Douglas Richards, male    DOB: 1945/08/07  Age: 76 y.o. MRN: [id number]  CC: No chief complaint on file.   HPI Douglas Richards presents for LBP, AK, DM  Outpatient Medications Prior to Visit  Medication Sig Dispense Refill   amLODipine (NORVASC) 5 MG tablet TAKE 1 TABLET BY MOUTH DAILY. 90 tablet 3   ASPIRIN 81 PO Take by mouth.     Cholecalciferol (VITAMIN D3) 1000 UNITS tablet Take 1,000 Units by mouth daily.     Glucosamine-Chondroitin 750-600 MG TABS Take by mouth 2 (two) times daily. Reported on 04/28/2015     glucose blood (FREESTYLE LITE) test strip USE TO CHECK BLOOD SUGAR TWICE DAILY 100 strip 5   Lancets (FREESTYLE) lancets USE TO CHECK BLOOD SUGAR TWICE DAILY. Annual appt is due w/lab must see provider for future refills 100 each 5   loratadine (CLARITIN) 10 MG tablet Take 10 mg by mouth daily.     metFORMIN (GLUCOPHAGE) 500 MG tablet Take 1 tablet by mouth 2 times daily with a meal. 180 tablet 3   MINERAL OIL PO Take by mouth.     omeprazole (PRILOSEC) 20 MG capsule TAKE 1 CAPSULE BY MOUTH ONCE DAILY 90 capsule 3   Psyllium (METAMUCIL PO) Take by mouth.     repaglinide (PRANDIN) 1 MG tablet TAKE 1 TABLET BY MOUTH THREE TIMES DAILY BEFORE MEALS. 270 tablet 3   rosuvastatin (CRESTOR) 10 MG tablet TAKE 1 TABLET BY MOUTH ONCE A DAY **DUE FOR ANNUAL APPOINTMENT** 90 tablet 3   Saw Palmetto, Serenoa repens, (SAW PALMETTO PO) Take by mouth.     tamsulosin (FLOMAX) 0.4 MG CAPS capsule TAKE 1 CAPSULE BY MOUTH ONCE DAILY 90 capsule 3   thiamine (VITAMIN B-1) 100 MG tablet Take 100 mg by mouth daily.     trandolapril (MAVIK) 4 MG tablet TAKE 1 TABLET BY MOUTH ONCE DAILY 90 tablet 3   allopurinol (ZYLOPRIM) 100 MG tablet TAKE AS DIRECTED ONCE A DAY 90 tablet 3   HYDROcodone-acetaminophen (NORCO/VICODIN) 5-325 MG tablet Take 1 tablet by mouth every 6 (six) hours as needed for severe pain. 120 tablet 0   No facility-administered medications prior to visit.     ROS: Review of Systems  Constitutional:  Negative for appetite change, fatigue and unexpected weight change.  HENT:  Negative for congestion, nosebleeds, sneezing, sore throat and trouble swallowing.   Eyes:  Negative for itching and visual disturbance.  Respiratory:  Negative for cough.   Cardiovascular:  Negative for chest pain, palpitations and leg swelling.  Gastrointestinal:  Negative for abdominal distention, blood in stool, diarrhea and nausea.  Genitourinary:  Negative for frequency and hematuria.  Musculoskeletal:  Positive for back pain. Negative for gait problem, joint swelling and neck pain.  Skin:  Negative for rash.  Neurological:  Negative for dizziness, tremors, speech difficulty and weakness.  Psychiatric/Behavioral:  Negative for agitation, dysphoric mood and sleep disturbance. The patient is not nervous/anxious.     Objective:  BP 110/60 (BP Location: Left Arm, Patient Position: Sitting, Cuff Size: Normal)   Pulse 84   Temp 98.4 F (36.9 C) (Oral)   Ht '5\' 8"'$  (1.727 m)   Wt 177 lb (80.3 kg)   SpO2 96%   BMI 26.91 kg/m   BP Readings from Last 3 Encounters:  09/07/21 110/60  06/01/21 130/78  03/01/21 128/72    Wt Readings from Last 3 Encounters:  09/07/21 177 lb (80.3 kg)  06/01/21 181  lb (82.1 kg)  03/01/21 181 lb 3.5 oz (82.2 kg)    Physical Exam Constitutional:      General: He is not in acute distress.    Appearance: Normal appearance. He is well-developed.     Comments: NAD  Eyes:     Conjunctiva/sclera: Conjunctivae normal.     Pupils: Pupils are equal, round, and reactive to light.  Neck:     Thyroid: No thyromegaly.     Vascular: No JVD.  Cardiovascular:     Rate and Rhythm: Normal rate and regular rhythm.     Heart sounds: Normal heart sounds. No murmur heard.    No friction rub. No gallop.  Pulmonary:     Effort: Pulmonary effort is normal. No respiratory distress.     Breath sounds: Normal breath sounds. No wheezing or rales.   Chest:     Chest wall: No tenderness.  Abdominal:     General: Bowel sounds are normal. There is no distension.     Palpations: Abdomen is soft. There is no mass.     Tenderness: There is no abdominal tenderness. There is no guarding or rebound.  Musculoskeletal:        General: Normal range of motion.     Cervical back: Normal range of motion. Tenderness present.  Lymphadenopathy:     Cervical: No cervical adenopathy.  Skin:    General: Skin is warm and dry.     Findings: No rash.  Neurological:     Mental Status: He is alert and oriented to person, place, and time.     Cranial Nerves: No cranial nerve deficit.     Motor: No abnormal muscle tone.     Coordination: Coordination normal.     Gait: Gait normal.     Deep Tendon Reflexes: Reflexes are normal and symmetric.  Psychiatric:        Behavior: Behavior normal.        Thought Content: Thought content normal.        Judgment: Judgment normal.      AK x1 R cheek    Procedure Note :     Procedure : Cryosurgery   Indication:  Actinic keratosis(es)   Risks including unsuccessful procedure , bleeding, infection, bruising, scar, a need for a repeat  procedure and others were explained to the patient in detail as well as the benefits. Informed consent was obtained verbally.     lesion(s)  on    was/were treated with liquid nitrogen on a Q-tip in a usual fasion . Band-Aid was applied and antibiotic ointment was given for a later use.   Tolerated well. Complications none.   Postprocedure instructions :     Keep the wounds clean. You can wash them with liquid soap and water. Pat dry with gauze or a Kleenex tissue  Before applying antibiotic ointment and a Band-Aid.   You need to report immediately  if  any signs of infection develop.    Lab Results  Component Value Date   WBC 6.0 09/17/2015   HGB 13.5 09/17/2015   HCT 40.5 09/17/2015   PLT 151.0 09/17/2015   GLUCOSE 154 (H) 09/01/2021   CHOL 122 09/17/2015   TRIG  150.0 (H) 09/17/2015   HDL 40.30 09/17/2015   LDLDIRECT 105.5 09/10/2013   LDLCALC 51 09/17/2015   ALT 20 09/01/2021   AST 13 09/01/2021   NA 131 (L) 09/01/2021   K 5.3 No hemolysis seen (H) 09/01/2021   CL 97 09/01/2021  CREATININE 1.69 (H) 09/01/2021   BUN 38 (H) 09/01/2021   CO2 25 09/01/2021   TSH 2.65 09/17/2015   PSA 0.81 09/17/2015   HGBA1C 6.8 (H) 09/01/2021   MICROALBUR 0.4 03/31/2010    MR Lumbar Spine Wo Contrast  Result Date: 11/08/2020 CLINICAL DATA:  Persistent low back pain radiating down left leg since July EXAM: MRI LUMBAR SPINE WITHOUT CONTRAST TECHNIQUE: Multiplanar, multisequence MR imaging of the lumbar spine was performed. No intravenous contrast was administered. COMPARISON:  Lumbar spine radiographs 08/25/2020, CT abdomen/pelvis 11/04/2014 FINDINGS: Segmentation: Standard; the lowest formed disc space is designated L5-S1 Alignment: There is straightening of the normal lumbar spine lordosis. There is trace retrolisthesis of L5 on S1, similar to the prior CT from 2016. There is no other anterior or retrolisthesis. Vertebrae: Vertebral body heights are preserved. There is mild degenerative endplate marrow signal abnormality posteriorly at L3-L4. Otherwise, there is no focal marrow signal abnormality. Conus medullaris and cauda equina: Conus extends to the L1-L2 level. Conus and cauda equina appear normal. Paraspinal and other soft tissues: A small T2 hyperintense lesion in the right kidney is incompletely characterized but likely reflects a cyst. The paraspinal soft tissues are unremarkable. Disc levels: There is mild multilevel disc desiccation and narrowing. There is multilevel facet arthropathy, most advanced at L4-L5. There are associated trace facet joint effusions. T12-L1: No significant spinal canal or neural foraminal stenosis. L1-L2: There is a minimal disc protrusion without significant spinal canal or neural foraminal stenosis. L2-L3: There is a mild disc bulge  mild ligamentum flavum thickening, and mild bilateral facet arthropathy without significant spinal canal or neural foraminal stenosis. L3-L4: There is a diffuse disc bulge with a superimposed superiorly migrated central disc extrusion, bulky ligamentum flavum thickening, and bilateral facet arthropathy resulting in mild-to-moderate spinal canal stenosis with narrowing of the bilateral subarticular zones, right worse than left, without definite evidence of frank nerve root impingement. There is mild right worse than left neural foraminal stenosis. L4-L5: There is a diffuse disc bulge, ligamentum flavum thickening, and bilateral facet arthropathy resulting in severe spinal canal stenosis with compression of the cauda equina nerve roots particularly involving the traversing nerve roots in the right subarticular zone and mild to moderate right and mild left neural foraminal stenosis. L5-S1: There is a mild disc bulge and bilateral facet arthropathy resulting in mild crowding of the subarticular zones without evidence of nerve root impingement and mild to moderate right and mild left neural foraminal stenosis. IMPRESSION: 1. 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 Mild to moderate right and mild left neural foraminal stenosis at this level. 2. Diffuse disc bulge with a small superimposed superiorly migrated extrusion at L3-L4 resulting in mild-to-moderate spinal canal stenosis with crowding of the bilateral subarticular zones without definite evidence of nerve root impingement. Mild right worse than left neural foraminal stenosis at this level. 3. Mild-to-moderate right and mild left neural foraminal stenosis at L5-S1. 4. 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  Electronically Signed   By: Valetta Mole M.D.   On: 11/08/2020 10:18    Assessment & Plan:   Problem List Items Addressed This Visit     Actinic keratosis    See Cryo      CRI (chronic renal insufficiency), stage 3 (moderate)    Avoid NSAIDs F/u  w/Nephrology       DM2 (diabetes mellitus, type 2) ([place]) - Primary    On Metformin, Prandin      Relevant Orders   Comprehensive metabolic panel   Hemoglobin A1c   Gout    No replace Reduce Allopurinol to 50 mg a day eventually.  He will need to discontinue aspirin.       Low back pain    Norco prn  Potential benefits of a long term opioids use as well as potential risks (i.e. addiction risk, apnea etc) and complications (i.e. Somnolence, constipation and others) were explained to the patient and were aknowledged.      Relevant Medications   HYDROcodone-acetaminophen (NORCO/VICODIN) 5-325 MG tablet      Meds ordered this encounter  Medications   DISCONTD: HYDROcodone-acetaminophen (NORCO/VICODIN) 5-325 MG tablet    Sig: Take 1 tablet by mouth every 6 (six) hours as needed for severe pain.    Dispense:  120 tablet    Refill:  0   allopurinol (ZYLOPRIM) 100 MG tablet    Sig: Take 1/2 tablet (50 mg total) by mouth daily.    Dispense:  90 tablet    Refill:  1   HYDROcodone-acetaminophen (NORCO/VICODIN) 5-325 MG tablet    Sig: Take 1 tablet by mouth every 6 (six) hours as needed for severe pain.    Dispense:  120 tablet    Refill:  0      Follow-up: Return in about 3 months (around 12/08/2021) for a follow-up visit.  [name], MD

## 2021-09-07 NOTE — Patient Instructions (Signed)
   Postprocedure instructions :     Keep the wounds clean. You can wash them with liquid soap and water. Pat dry with gauze or a Kleenex tissue  Before applying antibiotic ointment and a Band-Aid.   You need to report immediately  if  any signs of infection develop.    

## 2021-09-07 NOTE — Assessment & Plan Note (Signed)
No replace Reduce Allopurinol to 50 mg a day eventually.  He will need to discontinue aspirin.

## 2021-09-07 NOTE — Assessment & Plan Note (Signed)
On Metformin, Prandin 

## 2021-09-07 NOTE — Assessment & Plan Note (Signed)
Norco prn  Potential benefits of a long term opioids use as well as potential risks (i.e. addiction risk, apnea etc) and complications (i.e. Somnolence, constipation and others) were explained to the patient and were aknowledged. 

## 2021-09-07 NOTE — Assessment & Plan Note (Signed)
Avoid NSAIDs F/u w/Nephrology

## 2021-09-13 DIAGNOSIS — Z961 Presence of intraocular lens: Secondary | ICD-10-CM | POA: Diagnosis not present

## 2021-09-13 DIAGNOSIS — H35373 Puckering of macula, bilateral: Secondary | ICD-10-CM | POA: Diagnosis not present

## 2021-09-13 DIAGNOSIS — H4911 Fourth [trochlear] nerve palsy, right eye: Secondary | ICD-10-CM | POA: Diagnosis not present

## 2021-09-13 DIAGNOSIS — H18413 Arcus senilis, bilateral: Secondary | ICD-10-CM | POA: Diagnosis not present

## 2021-10-09 ENCOUNTER — Other Ambulatory Visit: Payer: Self-pay | Admitting: Internal Medicine

## 2021-10-10 ENCOUNTER — Other Ambulatory Visit (HOSPITAL_COMMUNITY): Payer: Self-pay

## 2021-10-11 ENCOUNTER — Other Ambulatory Visit (HOSPITAL_COMMUNITY): Payer: Self-pay

## 2021-10-11 MED ORDER — ROSUVASTATIN CALCIUM 10 MG PO TABS
10.0000 mg | ORAL_TABLET | Freq: Every day | ORAL | 3 refills | Status: DC
  Filled 2021-10-11: qty 90, 90d supply, fill #0
  Filled 2022-01-08: qty 90, 90d supply, fill #1
  Filled 2022-04-09: qty 90, 90d supply, fill #2
  Filled 2022-07-07: qty 90, 90d supply, fill #3

## 2021-10-12 ENCOUNTER — Other Ambulatory Visit (HOSPITAL_COMMUNITY): Payer: Self-pay

## 2021-10-17 ENCOUNTER — Other Ambulatory Visit (HOSPITAL_COMMUNITY): Payer: Self-pay

## 2021-11-06 ENCOUNTER — Other Ambulatory Visit: Payer: Self-pay | Admitting: Internal Medicine

## 2021-11-07 ENCOUNTER — Other Ambulatory Visit (HOSPITAL_COMMUNITY): Payer: Self-pay

## 2021-11-10 ENCOUNTER — Other Ambulatory Visit (HOSPITAL_COMMUNITY): Payer: Self-pay

## 2021-11-11 ENCOUNTER — Other Ambulatory Visit: Payer: Self-pay | Admitting: Internal Medicine

## 2021-11-11 ENCOUNTER — Other Ambulatory Visit (HOSPITAL_COMMUNITY): Payer: Self-pay

## 2021-11-11 MED ORDER — FREESTYLE LITE TEST VI STRP
ORAL_STRIP | 5 refills | Status: DC
  Filled 2021-11-11: qty 100, 50d supply, fill #0

## 2021-11-11 MED ORDER — TAMSULOSIN HCL 0.4 MG PO CAPS
0.4000 mg | ORAL_CAPSULE | Freq: Every day | ORAL | 0 refills | Status: DC
  Filled 2021-11-11: qty 90, 90d supply, fill #0

## 2021-11-11 MED ORDER — FREESTYLE LITE TEST VI STRP
ORAL_STRIP | 5 refills | Status: AC
  Filled 2021-11-11: qty 100, 50d supply, fill #0

## 2021-11-14 MED ORDER — HYDROCODONE-ACETAMINOPHEN 5-325 MG PO TABS
1.0000 | ORAL_TABLET | Freq: Four times a day (QID) | ORAL | 0 refills | Status: DC | PRN
  Filled 2021-11-14: qty 120, 30d supply, fill #0

## 2021-11-15 ENCOUNTER — Other Ambulatory Visit (HOSPITAL_COMMUNITY): Payer: Self-pay

## 2021-11-17 ENCOUNTER — Other Ambulatory Visit (HOSPITAL_COMMUNITY): Payer: Self-pay

## 2021-12-01 ENCOUNTER — Other Ambulatory Visit (INDEPENDENT_AMBULATORY_CARE_PROVIDER_SITE_OTHER): Payer: PPO

## 2021-12-01 DIAGNOSIS — N182 Chronic kidney disease, stage 2 (mild): Secondary | ICD-10-CM

## 2021-12-01 DIAGNOSIS — E1122 Type 2 diabetes mellitus with diabetic chronic kidney disease: Secondary | ICD-10-CM | POA: Diagnosis not present

## 2021-12-01 LAB — COMPREHENSIVE METABOLIC PANEL
ALT: 21 U/L (ref 0–53)
AST: 19 U/L (ref 0–37)
Albumin: 4.5 g/dL (ref 3.5–5.2)
Alkaline Phosphatase: 30 U/L — ABNORMAL LOW (ref 39–117)
BUN: 28 mg/dL — ABNORMAL HIGH (ref 6–23)
CO2: 22 mEq/L (ref 19–32)
Calcium: 9.6 mg/dL (ref 8.4–10.5)
Chloride: 102 mEq/L (ref 96–112)
Creatinine, Ser: 1.63 mg/dL — ABNORMAL HIGH (ref 0.40–1.50)
GFR: 40.77 mL/min — ABNORMAL LOW (ref >60.00)
Glucose, Bld: 95 mg/dL (ref 70–99)
Potassium: 4.4 mEq/L (ref 3.5–5.1)
Sodium: 135 mEq/L (ref 135–145)
Total Bilirubin: 0.6 mg/dL (ref 0.2–1.2)
Total Protein: 6.9 g/dL (ref 6.0–8.3)

## 2021-12-01 LAB — HEMOGLOBIN A1C: Hgb A1c MFr Bld: 6.7 % — ABNORMAL HIGH (ref 4.6–6.5)

## 2021-12-02 ENCOUNTER — Other Ambulatory Visit (HOSPITAL_COMMUNITY): Payer: Self-pay

## 2021-12-04 ENCOUNTER — Other Ambulatory Visit: Payer: Self-pay | Admitting: Internal Medicine

## 2021-12-05 ENCOUNTER — Other Ambulatory Visit (HOSPITAL_COMMUNITY): Payer: Self-pay

## 2021-12-05 MED ORDER — REPAGLINIDE 1 MG PO TABS
1.0000 mg | ORAL_TABLET | Freq: Three times a day (TID) | ORAL | 3 refills | Status: DC
  Filled 2021-12-05: qty 270, 90d supply, fill #0
  Filled 2022-03-19: qty 270, 90d supply, fill #1
  Filled 2022-07-07: qty 270, 90d supply, fill #2
  Filled 2022-10-08: qty 270, 90d supply, fill #3

## 2021-12-05 MED ORDER — METHOCARBAMOL 500 MG PO TABS
500.0000 mg | ORAL_TABLET | Freq: Three times a day (TID) | ORAL | 0 refills | Status: DC | PRN
  Filled 2021-12-05: qty 30, 10d supply, fill #0

## 2021-12-06 ENCOUNTER — Other Ambulatory Visit (HOSPITAL_COMMUNITY): Payer: Self-pay

## 2021-12-07 ENCOUNTER — Other Ambulatory Visit (HOSPITAL_COMMUNITY): Payer: Self-pay

## 2021-12-08 ENCOUNTER — Other Ambulatory Visit (HOSPITAL_COMMUNITY): Payer: Self-pay

## 2021-12-08 ENCOUNTER — Encounter: Payer: Self-pay | Admitting: Internal Medicine

## 2021-12-08 ENCOUNTER — Ambulatory Visit (INDEPENDENT_AMBULATORY_CARE_PROVIDER_SITE_OTHER): Payer: PPO | Admitting: Internal Medicine

## 2021-12-08 VITALS — BP 134/70 | HR 71 | Temp 98.5°F | Ht 68.0 in | Wt 175.8 lb

## 2021-12-08 DIAGNOSIS — N182 Chronic kidney disease, stage 2 (mild): Secondary | ICD-10-CM | POA: Diagnosis not present

## 2021-12-08 DIAGNOSIS — N183 Chronic kidney disease, stage 3 unspecified: Secondary | ICD-10-CM | POA: Diagnosis not present

## 2021-12-08 DIAGNOSIS — M1000 Idiopathic gout, unspecified site: Secondary | ICD-10-CM | POA: Diagnosis not present

## 2021-12-08 DIAGNOSIS — M545 Low back pain, unspecified: Secondary | ICD-10-CM | POA: Diagnosis not present

## 2021-12-08 DIAGNOSIS — E1122 Type 2 diabetes mellitus with diabetic chronic kidney disease: Secondary | ICD-10-CM | POA: Diagnosis not present

## 2021-12-08 DIAGNOSIS — G8929 Other chronic pain: Secondary | ICD-10-CM

## 2021-12-08 DIAGNOSIS — Z23 Encounter for immunization: Secondary | ICD-10-CM

## 2021-12-08 MED ORDER — HYDROCODONE-ACETAMINOPHEN 5-325 MG PO TABS
1.0000 | ORAL_TABLET | Freq: Four times a day (QID) | ORAL | 0 refills | Status: DC | PRN
  Filled 2021-12-08 – 2022-02-01 (×2): qty 120, 30d supply, fill #0

## 2021-12-08 NOTE — Progress Notes (Addendum)
Subjective:  Patient ID: Douglas Richards, male    DOB: 01-Feb-1946  Age: 76 y.o. MRN: [id number]  CC: Follow-up (3 month f/u- Flu shot)   HPI RENJI BERWICK presents for HTN, LBP, DM, CRI  Outpatient Medications Prior to Visit  Medication Sig Dispense Refill   allopurinol (ZYLOPRIM) 100 MG tablet Take 1/2 tablet (50 mg total) by mouth daily. 90 tablet 1   amLODipine (NORVASC) 5 MG tablet TAKE 1 TABLET BY MOUTH DAILY. 90 tablet 3   ASPIRIN 81 PO Take by mouth.     Cholecalciferol (VITAMIN D3) 1000 UNITS tablet Take 1,000 Units by mouth daily.     Glucosamine-Chondroitin 750-600 MG TABS Take by mouth 2 (two) times daily. Reported on 04/28/2015     glucose blood (FREESTYLE LITE) test strip USE TO CHECK BLOOD SUGAR TWICE DAILY 100 strip 5   Lancets (FREESTYLE) lancets USE TO CHECK BLOOD SUGAR TWICE DAILY. Annual appt is due w/lab must see provider for future refills 100 each 5   loratadine (CLARITIN) 10 MG tablet Take 10 mg by mouth daily.     metFORMIN (GLUCOPHAGE) 500 MG tablet Take 1 tablet by mouth 2 times daily with a meal. 180 tablet 3   methocarbamol (ROBAXIN) 500 MG tablet Take 1 tablet (500 mg total) by mouth every 8 (eight) hours as needed. 30 tablet 0   MINERAL OIL PO Take by mouth.     omeprazole (PRILOSEC) 20 MG capsule TAKE 1 CAPSULE BY MOUTH ONCE DAILY 90 capsule 3   Psyllium (METAMUCIL PO) Take by mouth.     repaglinide (PRANDIN) 1 MG tablet Take 1 tablet (1 mg total) by mouth 3 (three) times daily before meals. 270 tablet 3   rosuvastatin (CRESTOR) 10 MG tablet Take 1 tablet (10 mg total) by mouth daily. 90 tablet 3   Saw Palmetto, Serenoa repens, (SAW PALMETTO PO) Take by mouth.     tamsulosin (FLOMAX) 0.4 MG CAPS capsule Take 1 capsule (0.4 mg total) by mouth daily. 90 capsule 0   thiamine (VITAMIN B-1) 100 MG tablet Take 100 mg by mouth daily.     trandolapril (MAVIK) 4 MG tablet TAKE 1 TABLET BY MOUTH ONCE DAILY 90 tablet 3   HYDROcodone-acetaminophen (NORCO/VICODIN)  5-325 MG tablet Take 1 tablet by mouth every 6 (six) hours as needed for severe pain. 120 tablet 0   No facility-administered medications prior to visit.    ROS: Review of Systems  Constitutional:  Negative for appetite change, fatigue and unexpected weight change.  HENT:  Negative for congestion, nosebleeds, sneezing, sore throat and trouble swallowing.   Eyes:  Negative for itching and visual disturbance.  Respiratory:  Negative for cough.   Cardiovascular:  Negative for chest pain, palpitations and leg swelling.  Gastrointestinal:  Negative for abdominal distention, blood in stool, diarrhea and nausea.  Genitourinary:  Negative for frequency and hematuria.  Musculoskeletal:  Positive for back pain. Negative for gait problem, joint swelling and neck pain.  Skin:  Negative for rash.  Neurological:  Negative for dizziness, tremors, speech difficulty and weakness.  Psychiatric/Behavioral:  Negative for agitation, dysphoric mood and sleep disturbance. The patient is not nervous/anxious.     Objective:  BP 134/70 (BP Location: Left Arm)   Pulse 71   Temp 98.5 F (36.9 C) (Oral)   Ht '5\' 8"'$  (1.727 m)   Wt 175 lb 12.8 oz (79.7 kg)   SpO2 97%   BMI 26.73 kg/m   BP Readings from Last 3  Encounters:  12/08/21 134/70  09/07/21 110/60  06/01/21 130/78    Wt Readings from Last 3 Encounters:  12/08/21 175 lb 12.8 oz (79.7 kg)  09/07/21 177 lb (80.3 kg)  06/01/21 181 lb (82.1 kg)    Physical Exam Constitutional:      General: He is not in acute distress.    Appearance: Normal appearance. He is well-developed.     Comments: NAD  Eyes:     Conjunctiva/sclera: Conjunctivae normal.     Pupils: Pupils are equal, round, and reactive to light.  Neck:     Thyroid: No thyromegaly.     Vascular: No JVD.  Cardiovascular:     Rate and Rhythm: Normal rate and regular rhythm.     Heart sounds: Normal heart sounds. No murmur heard.    No friction rub. No gallop.  Pulmonary:     Effort:  Pulmonary effort is normal. No respiratory distress.     Breath sounds: Normal breath sounds. No wheezing or rales.  Chest:     Chest wall: No tenderness.  Abdominal:     General: Bowel sounds are normal. There is no distension.     Palpations: Abdomen is soft. There is no mass.     Tenderness: There is abdominal tenderness. There is no guarding or rebound.  Musculoskeletal:        General: No tenderness. Normal range of motion.     Cervical back: Normal range of motion.  Lymphadenopathy:     Cervical: No cervical adenopathy.  Skin:    General: Skin is warm and dry.     Findings: No rash.  Neurological:     Mental Status: He is alert and oriented to person, place, and time.     Cranial Nerves: No cranial nerve deficit.     Motor: No abnormal muscle tone.     Coordination: Coordination normal.     Gait: Gait normal.     Deep Tendon Reflexes: Reflexes are normal and symmetric.  Psychiatric:        Behavior: Behavior normal.        Thought Content: Thought content normal.        Judgment: Judgment normal.   Stiff back  Lab Results  Component Value Date   WBC 6.0 09/17/2015   HGB 13.5 09/17/2015   HCT 40.5 09/17/2015   PLT 151.0 09/17/2015   GLUCOSE 95 12/01/2021   CHOL 122 09/17/2015   TRIG 150.0 (H) 09/17/2015   HDL 40.30 09/17/2015   LDLDIRECT 105.5 09/10/2013   LDLCALC 51 09/17/2015   ALT 21 12/01/2021   AST 19 12/01/2021   NA 135 12/01/2021   K 4.4 12/01/2021   CL 102 12/01/2021   CREATININE 1.63 (H) 12/01/2021   BUN 28 (H) 12/01/2021   CO2 22 12/01/2021   TSH 2.65 09/17/2015   PSA 0.81 09/17/2015   HGBA1C 6.7 (H) 12/01/2021   MICROALBUR 0.4 03/31/2010    MR Lumbar Spine Wo Contrast  Result Date: 11/08/2020 CLINICAL DATA:  Persistent low back pain radiating down left leg since July EXAM: MRI LUMBAR SPINE WITHOUT CONTRAST TECHNIQUE: Multiplanar, multisequence MR imaging of the lumbar spine was performed. No intravenous contrast was administered. COMPARISON:   Lumbar spine radiographs 08/25/2020, CT abdomen/pelvis 11/04/2014 FINDINGS: Segmentation: Standard; the lowest formed disc space is designated L5-S1 Alignment: There is straightening of the normal lumbar spine lordosis. There is trace retrolisthesis of L5 on S1, similar to the prior CT from 2016. There is no other anterior or retrolisthesis.  Vertebrae: Vertebral body heights are preserved. There is mild degenerative endplate marrow signal abnormality posteriorly at L3-L4. Otherwise, there is no focal marrow signal abnormality. Conus medullaris and cauda equina: Conus extends to the L1-L2 level. Conus and cauda equina appear normal. Paraspinal and other soft tissues: A small T2 hyperintense lesion in the right kidney is incompletely characterized but likely reflects a cyst. The paraspinal soft tissues are unremarkable. Disc levels: There is mild multilevel disc desiccation and narrowing. There is multilevel facet arthropathy, most advanced at L4-L5. There are associated trace facet joint effusions. T12-L1: No significant spinal canal or neural foraminal stenosis. L1-L2: There is a minimal disc protrusion without significant spinal canal or neural foraminal stenosis. L2-L3: There is a mild disc bulge mild ligamentum flavum thickening, and mild bilateral facet arthropathy without significant spinal canal or neural foraminal stenosis. L3-L4: There is a diffuse disc bulge with a superimposed superiorly migrated central disc extrusion, bulky ligamentum flavum thickening, and bilateral facet arthropathy resulting in mild-to-moderate spinal canal stenosis with narrowing of the bilateral subarticular zones, right worse than left, without definite evidence of frank nerve root impingement. There is mild right worse than left neural foraminal stenosis. L4-L5: There is a diffuse disc bulge, ligamentum flavum thickening, and bilateral facet arthropathy resulting in severe spinal canal stenosis with compression of the cauda equina  nerve roots particularly involving the traversing nerve roots in the right subarticular zone and mild to moderate right and mild left neural foraminal stenosis. L5-S1: There is a mild disc bulge and bilateral facet arthropathy resulting in mild crowding of the subarticular zones without evidence of nerve root impingement and mild to moderate right and mild left neural foraminal stenosis. IMPRESSION: 1. 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 Mild to moderate right and mild left neural foraminal stenosis at this level. 2. Diffuse disc bulge with a small superimposed superiorly migrated extrusion at L3-L4 resulting in mild-to-moderate spinal canal stenosis with crowding of the bilateral subarticular zones without definite evidence of nerve root impingement. Mild right worse than left neural foraminal stenosis at this level. 3. Mild-to-moderate right and mild left neural foraminal stenosis at L5-S1. 4. 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 Electronically Signed   By: Valetta Mole M.D.   On: 11/08/2020 10:18    Assessment & Plan:   Problem List Items Addressed This Visit     DM2 (diabetes mellitus, type 2) ([place])    On Metformin, Prandin      Gout    Cont on allopurinol dose 50 mg a day       CRI (chronic renal insufficiency), stage 3 (moderate)    Not better.   Avoid NSAIDs Hydrate well F/u w/Nephrology       Low back pain    Chronic, severe Norco prn  Potential benefits of a long term opioids use as well as potential risks (i.e. addiction risk, apnea etc) and complications (i.e. Somnolence, constipation and others) were explained to the patient and were aknowledged.      Relevant Medications   HYDROcodone-acetaminophen (NORCO/VICODIN)  5-325 MG tablet   Other Visit Diagnoses     Needs flu shot    -  Primary   Relevant Orders   Flu Vaccine QUAD High Dose(Fluad) (Completed)         Meds ordered this  encounter  Medications   HYDROcodone-acetaminophen (NORCO/VICODIN) 5-325 MG tablet    Sig: Take 1 tablet by mouth every 6 (six) hours as needed for severe pain. Fill on or after 01/19/22    Dispense:  120 tablet    Refill:  0    Please fill on or after 01/19/22      Follow-up: Return in about 3 months (around 03/10/2022) for a follow-up visit.  [name], MD

## 2021-12-08 NOTE — Assessment & Plan Note (Signed)
Cont on allopurinol dose 50 mg a day

## 2021-12-08 NOTE — Assessment & Plan Note (Signed)
On Metformin, Prandin

## 2021-12-08 NOTE — Assessment & Plan Note (Addendum)
Not better.   Avoid NSAIDs Hydrate well F/u w/Nephrology

## 2021-12-10 ENCOUNTER — Other Ambulatory Visit (HOSPITAL_COMMUNITY): Payer: Self-pay

## 2021-12-12 ENCOUNTER — Other Ambulatory Visit (HOSPITAL_COMMUNITY): Payer: Self-pay

## 2021-12-20 DIAGNOSIS — M5416 Radiculopathy, lumbar region: Secondary | ICD-10-CM | POA: Diagnosis not present

## 2021-12-20 DIAGNOSIS — M5116 Intervertebral disc disorders with radiculopathy, lumbar region: Secondary | ICD-10-CM | POA: Diagnosis not present

## 2021-12-20 NOTE — Assessment & Plan Note (Signed)
Chronic, severe Norco prn  Potential benefits of a long term opioids use as well as potential risks (i.e. addiction risk, apnea etc) and complications (i.e. Somnolence, constipation and others) were explained to the patient and were aknowledged.

## 2022-01-08 ENCOUNTER — Other Ambulatory Visit (HOSPITAL_COMMUNITY): Payer: Self-pay

## 2022-01-09 ENCOUNTER — Other Ambulatory Visit (HOSPITAL_COMMUNITY): Payer: Self-pay

## 2022-01-09 DIAGNOSIS — N1831 Chronic kidney disease, stage 3a: Secondary | ICD-10-CM | POA: Diagnosis not present

## 2022-01-10 ENCOUNTER — Other Ambulatory Visit (HOSPITAL_COMMUNITY): Payer: Self-pay

## 2022-01-19 ENCOUNTER — Other Ambulatory Visit (HOSPITAL_COMMUNITY): Payer: Self-pay

## 2022-01-19 DIAGNOSIS — N2581 Secondary hyperparathyroidism of renal origin: Secondary | ICD-10-CM | POA: Diagnosis not present

## 2022-01-19 DIAGNOSIS — N1831 Chronic kidney disease, stage 3a: Secondary | ICD-10-CM | POA: Diagnosis not present

## 2022-01-19 DIAGNOSIS — I129 Hypertensive chronic kidney disease with stage 1 through stage 4 chronic kidney disease, or unspecified chronic kidney disease: Secondary | ICD-10-CM | POA: Diagnosis not present

## 2022-01-19 DIAGNOSIS — D631 Anemia in chronic kidney disease: Secondary | ICD-10-CM | POA: Diagnosis not present

## 2022-01-19 DIAGNOSIS — E785 Hyperlipidemia, unspecified: Secondary | ICD-10-CM | POA: Diagnosis not present

## 2022-02-01 ENCOUNTER — Other Ambulatory Visit (HOSPITAL_COMMUNITY): Payer: Self-pay

## 2022-02-05 ENCOUNTER — Other Ambulatory Visit (HOSPITAL_COMMUNITY): Payer: Self-pay

## 2022-02-13 ENCOUNTER — Other Ambulatory Visit (HOSPITAL_COMMUNITY): Payer: Self-pay

## 2022-02-26 ENCOUNTER — Other Ambulatory Visit: Payer: Self-pay | Admitting: Internal Medicine

## 2022-02-27 ENCOUNTER — Other Ambulatory Visit (HOSPITAL_COMMUNITY): Payer: Self-pay

## 2022-02-27 MED ORDER — OMEPRAZOLE 20 MG PO CPDR
20.0000 mg | DELAYED_RELEASE_CAPSULE | Freq: Every day | ORAL | 3 refills | Status: DC
  Filled 2022-02-27: qty 90, 90d supply, fill #0
  Filled 2022-05-21: qty 90, 90d supply, fill #1
  Filled 2022-08-13: qty 90, 90d supply, fill #2
  Filled 2022-11-12: qty 90, 90d supply, fill #3

## 2022-02-27 MED ORDER — TAMSULOSIN HCL 0.4 MG PO CAPS
0.4000 mg | ORAL_CAPSULE | Freq: Every day | ORAL | 3 refills | Status: DC
  Filled 2022-02-27: qty 90, 90d supply, fill #0
  Filled 2022-05-21: qty 90, 90d supply, fill #1
  Filled 2022-08-13: qty 90, 90d supply, fill #2
  Filled 2022-12-03: qty 90, 90d supply, fill #3

## 2022-03-06 ENCOUNTER — Ambulatory Visit (INDEPENDENT_AMBULATORY_CARE_PROVIDER_SITE_OTHER): Payer: PPO

## 2022-03-06 VITALS — Ht 68.0 in | Wt 175.0 lb

## 2022-03-06 DIAGNOSIS — Z0000 Encounter for general adult medical examination without abnormal findings: Secondary | ICD-10-CM

## 2022-03-06 NOTE — Patient Instructions (Signed)
[name] , Thank you for taking time to come for your Medicare Wellness Visit. I appreciate your ongoing commitment to your health goals. Please review the following plan we discussed and let me know if I can assist you in the future.   These are the goals we discussed:  Goals       Continue to improve the mobility of  my shoulder, increase my exercise, monitor diet closer, and continue to see Dr. Harlen Labs      patient (pt-stated)      Wants to come off of his medications!!!       Patient Stated      I want to increase my physical activity by me making up my mind to be more active.      Patient Stated (pt-stated)      I would like to lose 10 pounds and increase my water intake to better my kidney function.      Patient Stated (pt-stated)      My goal is to get my back right.        This is a list of the screening recommended for you and due dates:  Health Maintenance  Topic Date Due   Zoster (Shingles) Vaccine (1 of 2) Never done   Yearly kidney health urinalysis for diabetes  04/01/2011   Eye exam for diabetics  09/19/2018   Complete foot exam   05/26/2021   Colon Cancer Screening  08/04/2021   COVID-19 Vaccine (5 - 2023-24 season) 10/07/2021   Hemoglobin A1C  06/02/2022   Yearly kidney function blood test for diabetes  12/02/2022   Medicare Annual Wellness Visit  03/07/2023   DTaP/Tdap/Td vaccine (3 - Td or Tdap) 10/21/2029   Pneumonia Vaccine  Completed   Flu Shot  Completed   Hepatitis C Screening: USPSTF Recommendation to screen - Ages 69-79 yo.  Completed   HPV Vaccine  Aged Out    Advanced directives: yes copy requested  Conditions/risks identified: none  Next appointment: Follow up in one year for your annual wellness visit. 03/26/2023 '@1pm'$ /telephone  Preventive Care 34 Years and Older, Male  Preventive care refers to lifestyle choices and visits with your health care provider that can promote health and wellness. What does preventive care include? A yearly  physical exam. This is also called an annual well check. Dental exams once or twice a year. Routine eye exams. Ask your health care provider how often you should have your eyes checked. Personal lifestyle choices, including: Daily care of your teeth and gums. Regular physical activity. Eating a healthy diet. Avoiding tobacco and drug use. Limiting alcohol use. Practicing safe sex. Taking low doses of aspirin every day. Taking vitamin and mineral supplements as recommended by your health care provider. What happens during an annual well check? The services and screenings done by your health care provider during your annual well check will depend on your age, overall health, lifestyle risk factors, and family history of disease. Counseling  Your health care provider may ask you questions about your: Alcohol use. Tobacco use. Drug use. Emotional well-being. Home and relationship well-being. Sexual activity. Eating habits. History of falls. Memory and ability to understand (cognition). Work and work Statistician. Screening  You may have the following tests or measurements: Height, weight, and BMI. Blood pressure. Lipid and cholesterol levels. These may be checked every 5 years, or more frequently if you are over 65 years old. Skin check. Lung cancer screening. You may have this screening every year starting  at age 45 if you have a 30-pack-year history of smoking and currently smoke or have quit within the past 15 years. Fecal occult blood test (FOBT) of the stool. You may have this test every year starting at age 24. Flexible sigmoidoscopy or colonoscopy. You may have a sigmoidoscopy every 5 years or a colonoscopy every 10 years starting at age 74. Prostate cancer screening. Recommendations will vary depending on your family history and other risks. Hepatitis C blood test. Hepatitis B blood test. Sexually transmitted disease (STD) testing. Diabetes screening. This is done by  checking your blood sugar (glucose) after you have not eaten for a while (fasting). You may have this done every 1-3 years. Abdominal aortic aneurysm (AAA) screening. You may need this if you are a current or former smoker. Osteoporosis. You may be screened starting at age 3 if you are at high risk. Talk with your health care provider about your test results, treatment options, and if necessary, the need for more tests. Vaccines  Your health care provider may recommend certain vaccines, such as: Influenza vaccine. This is recommended every year. Tetanus, diphtheria, and acellular pertussis (Tdap, Td) vaccine. You may need a Td booster every 10 years. Zoster vaccine. You may need this after age 42. Pneumococcal 13-valent conjugate (PCV13) vaccine. One dose is recommended after age 60. Pneumococcal polysaccharide (PPSV23) vaccine. One dose is recommended after age 14. Talk to your health care provider about which screenings and vaccines you need and how often you need them. This information is not intended to replace advice given to you by your health care provider. Make sure you discuss any questions you have with your health care provider. Document Released: 02/19/2015 Document Revised: 10/13/2015 Document Reviewed: 11/24/2014 Elsevier Interactive Patient Education  2017 [place] Prevention in the Home Falls can cause injuries. They can happen to people of all ages. There are many things you can do to make your home safe and to help prevent falls. What can I do on the outside of my home? Regularly fix the edges of walkways and driveways and fix any cracks. Remove anything that might make you trip as you walk through a door, such as a raised step or threshold. Trim any bushes or trees on the path to your home. Use bright outdoor lighting. Clear any walking paths of anything that might make someone trip, such as rocks or tools. Regularly check to see if handrails are loose or  broken. Make sure that both sides of any steps have handrails. Any raised decks and porches should have guardrails on the edges. Have any leaves, snow, or ice cleared regularly. Use sand or salt on walking paths during winter. Clean up any spills in your garage right away. This includes oil or grease spills. What can I do in the bathroom? Use night lights. Install grab bars by the toilet and in the tub and shower. Do not use towel bars as grab bars. Use non-skid mats or decals in the tub or shower. If you need to sit down in the shower, use a plastic, non-slip stool. Keep the floor dry. Clean up any water that spills on the floor as soon as it happens. Remove soap buildup in the tub or shower regularly. Attach bath mats securely with double-sided non-slip rug tape. Do not have throw rugs and other things on the floor that can make you trip. What can I do in the bedroom? Use night lights. Make sure that you have a light by  your bed that is easy to reach. Do not use any sheets or blankets that are too big for your bed. They should not hang down onto the floor. Have a firm chair that has side arms. You can use this for support while you get dressed. Do not have throw rugs and other things on the floor that can make you trip. What can I do in the kitchen? Clean up any spills right away. Avoid walking on wet floors. Keep items that you use a lot in easy-to-reach places. If you need to reach something above you, use a strong step stool that has a grab bar. Keep electrical cords out of the way. Do not use floor polish or wax that makes floors slippery. If you must use wax, use non-skid floor wax. Do not have throw rugs and other things on the floor that can make you trip. What can I do with my stairs? Do not leave any items on the stairs. Make sure that there are handrails on both sides of the stairs and use them. Fix handrails that are broken or loose. Make sure that handrails are as long as  the stairways. Check any carpeting to make sure that it is firmly attached to the stairs. Fix any carpet that is loose or worn. Avoid having throw rugs at the top or bottom of the stairs. If you do have throw rugs, attach them to the floor with carpet tape. Make sure that you have a light switch at the top of the stairs and the bottom of the stairs. If you do not have them, ask someone to add them for you. What else can I do to help prevent falls? Wear shoes that: Do not have high heels. Have rubber bottoms. Are comfortable and fit you well. Are closed at the toe. Do not wear sandals. If you use a stepladder: Make sure that it is fully opened. Do not climb a closed stepladder. Make sure that both sides of the stepladder are locked into place. Ask someone to hold it for you, if possible. Clearly mark and make sure that you can see: Any grab bars or handrails. First and last steps. Where the edge of each step is. Use tools that help you move around (mobility aids) if they are needed. These include: Canes. Walkers. Scooters. Crutches. Turn on the lights when you go into a dark area. Replace any light bulbs as soon as they burn out. Set up your furniture so you have a clear path. Avoid moving your furniture around. If any of your floors are uneven, fix them. If there are any pets around you, be aware of where they are. Review your medicines with your doctor. Some medicines can make you feel dizzy. This can increase your chance of falling. Ask your doctor what other things that you can do to help prevent falls. This information is not intended to replace advice given to you by your health care provider. Make sure you discuss any questions you have with your health care provider. Document Released: 11/19/2008 Document Revised: 07/01/2015 Document Reviewed: 02/27/2014 Elsevier Interactive Patient Education  2017 [employer].

## 2022-03-06 NOTE — Progress Notes (Addendum)
Virtual Visit via Telephone Note  I connected with  Douglas Richards on 03/06/22 at  3:00 PM EST by telephone and verified that I am speaking with the correct person using two identifiers.  Location: Patient: home  Provider: [facility] Persons participating in the virtual visit: patient/Nurse Health Advisor   I discussed the limitations, risks, security and privacy concerns of performing an evaluation and management service by telephone and the availability of in person appointments. The patient expressed understanding and agreed to proceed.  Interactive audio and video telecommunications were attempted between this nurse and patient, however failed, due to patient having technical difficulties OR patient did not have access to video capability.  We continued and completed visit with audio only.  Some vital signs may be absent or patient reported.   Roger Shelter, LPN  Subjective:   Douglas Richards is a 77 y.o. male who presents for Medicare Annual/Subsequent preventive examination.  Review of Systems          Objective:    There were no vitals filed for this visit. There is no height or weight on file to calculate BMI.     03/01/2021    4:17 PM 03/30/2020   11:29 AM 02/27/2020    1:31 PM 01/06/2019    2:41 PM 11/29/2017    2:40 PM 11/14/2016    3:22 PM 04/28/2015    1:56 PM  Advanced Directives  Does Patient Have a Medical Advance Directive? Yes Yes Yes Yes Yes Yes Yes  Type of Advance Directive Living will;Healthcare Power of [place];Living will  [place];Living will [place];Living will [place];Living will   Does patient want to make changes to medical advance directive? No - Patient declined  No - Patient declined      Copy of [place] in Chart? No - copy requested   No - copy requested No - copy requested No - copy requested Yes    Current Medications  (verified) Outpatient Encounter Medications as of 03/06/2022  Medication Sig   allopurinol (ZYLOPRIM) 100 MG tablet Take 1/2 tablet (50 mg total) by mouth daily.   amLODipine (NORVASC) 5 MG tablet TAKE 1 TABLET BY MOUTH DAILY.   ASPIRIN 81 PO Take by mouth.   Cholecalciferol (VITAMIN D3) 1000 UNITS tablet Take 1,000 Units by mouth daily.   Glucosamine-Chondroitin 750-600 MG TABS Take by mouth 2 (two) times daily. Reported on 04/28/2015   glucose blood (FREESTYLE LITE) test strip USE TO CHECK BLOOD SUGAR TWICE DAILY   HYDROcodone-acetaminophen (NORCO/VICODIN) 5-325 MG tablet Take 1 tablet by mouth every 6 (six) hours as needed for severe pain.   Lancets (FREESTYLE) lancets USE TO CHECK BLOOD SUGAR TWICE DAILY. Annual appt is due w/lab must see provider for future refills   loratadine (CLARITIN) 10 MG tablet Take 10 mg by mouth daily.   metFORMIN (GLUCOPHAGE) 500 MG tablet Take 1 tablet by mouth 2 times daily with a meal.   methocarbamol (ROBAXIN) 500 MG tablet Take 1 tablet (500 mg total) by mouth every 8 (eight) hours as needed.   MINERAL OIL PO Take by mouth.   omeprazole (PRILOSEC) 20 MG capsule Take 1 capsule (20 mg total) by mouth daily.   Psyllium (METAMUCIL PO) Take by mouth.   repaglinide (PRANDIN) 1 MG tablet Take 1 tablet (1 mg total) by mouth 3 (three) times daily before meals.   rosuvastatin (CRESTOR) 10 MG tablet Take 1 tablet (  10 mg total) by mouth daily.   Saw Palmetto, Serenoa repens, (SAW PALMETTO PO) Take by mouth.   tamsulosin (FLOMAX) 0.4 MG CAPS capsule Take 1 capsule (0.4 mg total) by mouth daily.   thiamine (VITAMIN B-1) 100 MG tablet Take 100 mg by mouth daily.   trandolapril (MAVIK) 4 MG tablet TAKE 1 TABLET BY MOUTH ONCE DAILY   No facility-administered encounter medications on file as of 03/06/2022.    Allergies (verified) Atorvastatin, Prednisolone, Sulfonamide derivatives, and Trulicity [dulaglutide]   History: Past Medical History:  Diagnosis Date   Allergic  rhinitis    Allergy    Anal fissure    Cataract    right eye    Chronic kidney disease    Stage 3   Diabetes mellitus 02/06/2009   type II   Diverticulosis of colon (without mention of hemorrhage)    GERD (gastroesophageal reflux disease)    Gout, unspecified    Herpes zoster without mention of complication    Hyperlipidemia    Hypertension    Osteoarthrosis, unspecified whether generalized or localized, unspecified site    Other abnormal glucose    Personal history of colonic polyps    Past Surgical History:  Procedure Laterality Date   CATARACT EXTRACTION Right    COLONOSCOPY  01/15/2014   HEMORRHOID SURGERY     LIPOMA EXCISION     from the neck   POLYPECTOMY     rotator cuff surgery Right    TONSILLECTOMY     Family History  Problem Relation Age of Onset   COPD Mother    Stroke Mother    Hypertension Mother    Diabetes Father    COPD Father    Alcohol abuse Father    Stroke Father    Hypertension Father    Alcohol abuse Brother    Arthritis Brother        lupus   Heart disease Brother    Hypertension Other        fam hx   Heart disease Sister    Colon cancer Neg Hx    Colon polyps Neg Hx    Esophageal cancer Neg Hx    Stomach cancer Neg Hx    Rectal cancer Neg Hx    Social History   Socioeconomic History   Marital status: Married    Spouse name: Not on file   Number of children: 1   Years of education: Not on file   Highest education level: Not on file  Occupational History   Occupation: retired  Tobacco Use   Smoking status: Never   Smokeless tobacco: Never  Vaping Use   Vaping Use: Never used  Substance and Sexual Activity   Alcohol use: Not Currently    Alcohol/week: 0.0 standard drinks of alcohol   Drug use: No   Sexual activity: Yes  Other Topics Concern   Not on file  Social History Narrative   Regular exercise- no.   Social Determinants of Health   Financial Resource Strain: Low Risk  (03/01/2021)   Overall Financial Resource  Strain (CARDIA)    Difficulty of Paying Living Expenses: Not hard at all  Food Insecurity: No Food Insecurity (03/01/2021)   Hunger Vital Sign    Worried About Running Out of Food in the Last Year: Never true    Ran Out of Food in the Last Year: Never true  Transportation Needs: No Transportation Needs (03/01/2021)   PRAPARE - Hydrologist (Medical): No  Lack of Transportation (Non-Medical): No  Physical Activity: Insufficiently Active (03/01/2021)   Exercise Vital Sign    Days of Exercise per Week: 3 days    Minutes of Exercise per Session: 30 min  Stress: No Stress Concern Present (03/01/2021)   [place]    Feeling of Stress : Not at all  Social Connections: [place] (03/01/2021)   Social Connection and Isolation Panel [NHANES]    Frequency of Communication with Friends and Family: More than three times a week    Frequency of Social Gatherings with Friends and Family: Once a week    Attends Religious Services: More than 4 times per year    Active Member of Clubs or Organizations: No    Attends Music therapist: More than 4 times per year    Marital Status: Married    Tobacco Counseling Counseling given: Not Answered   Clinical Intake:                 Diabetic?yes         Activities of Daily Living     No data to display          Patient Care Team: Plotnikov, Evie Lacks, MD as PCP - General Darleen Crocker, MD as Consulting Physician (Ophthalmology) Kristeen Miss, MD as Consulting Physician (Neurosurgery)  Indicate any recent Medical Services you may have received from other than [facility] providers in the past year (date may be approximate).     Assessment:   This is a routine wellness examination for Douglas Richards.  Hearing/Vision screen No results found.  Dietary issues and exercise activities discussed:     Goals Addressed   None     Depression Screen    12/08/2021    2:02 PM 03/01/2021    3:42 PM 02/27/2020    1:29 PM 01/06/2019    2:42 PM 11/29/2017    2:40 PM 11/14/2016    3:22 PM 09/29/2016    2:51 PM  PHQ 2/9 Scores  PHQ - 2 Score 1 0 0 0 0 0 0  PHQ- 9 Score 2 0    0     Fall Risk    12/08/2021    2:01 PM 03/01/2021    3:42 PM 02/27/2020    1:31 PM 01/06/2019    2:42 PM 12/30/2018    4:38 PM  [place] in the past year? 1 0 0 0 0  Comment     Emmi Telephone Survey: data to providers prior to load  Number falls in past yr: 0 0 0 0   Injury with Fall? 0 0 0 0   Risk for fall due to : History of fall(s);Impaired balance/gait;Impaired mobility No Fall Risks No Fall Risks    Follow up   Falls evaluation completed      FALL RISK PREVENTION PERTAINING TO THE HOME:  Any stairs in or around the home? YES If so, are there any without handrails? Yes  Home free of loose throw rugs in walkways, pet beds, electrical cords, etc? Yes  Adequate lighting in your home to reduce risk of falls? Yes   ASSISTIVE DEVICES UTILIZED TO PREVENT FALLS:  Life alert? No  Use of a cane, walker or w/c? Yes Cane  Grab bars in the bathroom? Yes  Shower chair or bench in shower? Yes  Elevated toilet seat or a handicapped toilet? Yes   T Cognitive Function:    11/14/2016  3:26 PM  MMSE - Mini Mental State Exam  Orientation to time 5  Orientation to Place 5  Registration 3  Attention/ Calculation 5  Recall 3  Language- name 2 objects 2  Language- repeat 1  Language- follow 3 step command 3  Language- read & follow direction 1  Write a sentence 1  Copy design 1  Total score 30        Immunizations Immunization History  Administered Date(s) Administered   Fluad Quad(high Dose 65+) 10/18/2018, 10/22/2019, 11/25/2020, 12/08/2021   Influenza Whole 11/28/2005, 11/27/2008   Influenza, High Dose Seasonal PF 11/09/2015, 09/29/2016, 11/29/2017   Influenza,inj,Quad PF,6+ Mos 10/28/2013, 10/21/2014    Influenza-Unspecified 01/06/2013   PFIZER(Purple Top)SARS-COV-2 Vaccination 03/15/2019, 04/05/2019, 11/06/2019, 06/24/2020   Pneumococcal Conjugate-13 01/13/2014   Pneumococcal Polysaccharide-23 06/07/2011   Td 07/23/2009   Tdap 10/22/2019    TDAP status: Up to date  Flu Vaccine status: Up to date  Pneumococcal vaccine status: Up to date  Covid-19 vaccine status: Completed vaccines  Qualifies for Shingles Vaccine? Yes   Zostavax completed No   Shingrix Completed?: No.    Education has been provided regarding the importance of this vaccine. Patient has been advised to call insurance company to determine out of pocket expense if they have not yet received this vaccine. Advised may also receive vaccine at local pharmacy or Health Dept. Verbalized acceptance and understanding.  Screening Tests Health Maintenance  Topic Date Due   Zoster Vaccines- Shingrix (1 of 2) Never done   Diabetic kidney evaluation - Urine ACR  04/01/2011   OPHTHALMOLOGY EXAM  09/19/2018   FOOT EXAM  05/26/2021   COLONOSCOPY (Pts 45-31yr Insurance coverage will need to be confirmed)  08/04/2021   COVID-19 Vaccine (5 - 2023-24 season) 10/07/2021   Medicare Annual Wellness (AWV)  03/01/2022   HEMOGLOBIN A1C  06/02/2022   Diabetic kidney evaluation - eGFR measurement  12/02/2022   DTaP/Tdap/Td (3 - Td or Tdap) 10/21/2029   Pneumonia Vaccine 77 Years old  Completed   INFLUENZA VACCINE  Completed   Hepatitis C Screening  Completed   HPV VACCINES  Aged Out    Health Maintenance  Health Maintenance Due  Topic Date Due   Zoster Vaccines- Shingrix (1 of 2) Never done   Diabetic kidney evaluation - Urine ACR  04/01/2011   OPHTHALMOLOGY EXAM  09/19/2018   FOOT EXAM  05/26/2021   COLONOSCOPY (Pts 45-477yrInsurance coverage will need to be confirmed)  08/04/2021   COVID-19 Vaccine (5 - 2023-24 season) 10/07/2021   Medicare Annual Wellness (AWV)  03/01/2022    Colorectal cancer screening: Type of screening:  Colonoscopy. Completed yes. Repeat every 5 years  Lung Cancer Screening: (Low Dose CT Chest recommended if Age 77-80ears, 30 pack-year currently smoking OR have quit w/in 15years.) does not qualify.   Lung Cancer Screening Referral: no  Additional Screening:  Hepatitis C Screening: does not qualify; Completed no  Vision Screening: Recommended annual ophthalmology exams for early detection of glaucoma and other disorders of the eye. Is the patient up to date with their annual eye exam?  Yes  Who is the provider or what is the name of the office in which the patient attends annual eye exams? Dr BeTalbert Forestf pt is not established with a provider, would they like to be referred to a provider to establish care? No .   Dental Screening: Recommended annual dental exams for proper oral hygiene  Community Resource Referral / Chronic Care Management: CRR required  this visit?  No   CCM required this visit?  No      Plan:     I have personally reviewed and noted the following in the patient's chart:   Medical and social history Use of alcohol, tobacco or illicit drugs  Current medications and supplements including opioid prescriptions. Patient is not currently taking opioid prescriptions. Functional ability and status Nutritional status Physical activity Advanced directives List of other physicians Hospitalizations, surgeries, and ER visits in previous 12 months Vitals Screenings to include cognitive, depression, and falls Referrals and appointments  In addition, I have reviewed and discussed with patient certain preventive protocols, quality metrics, and best practice recommendations. A written personalized care plan for preventive services as well as general preventive health recommendations were provided to patient.     Roger Shelter, LPN   [id number]   Nurse Notes: Pt sts he has just scheduled appt or spinal injections.    Medical screening  examination/treatment/procedure(s) were performed by non-physician practitioner and as supervising physician I was immediately available for consultation/collaboration.  I agree with above. Lew Dawes, MD

## 2022-03-09 ENCOUNTER — Other Ambulatory Visit (INDEPENDENT_AMBULATORY_CARE_PROVIDER_SITE_OTHER): Payer: PPO

## 2022-03-09 DIAGNOSIS — E118 Type 2 diabetes mellitus with unspecified complications: Secondary | ICD-10-CM | POA: Diagnosis not present

## 2022-03-09 LAB — BASIC METABOLIC PANEL
BUN: 26 mg/dL — ABNORMAL HIGH (ref 6–23)
CO2: 26 mEq/L (ref 19–32)
Calcium: 9.6 mg/dL (ref 8.4–10.5)
Chloride: 99 mEq/L (ref 96–112)
Creatinine, Ser: 1.52 mg/dL — ABNORMAL HIGH (ref 0.40–1.50)
GFR: 44.25 mL/min — ABNORMAL LOW (ref >60.00)
Glucose, Bld: 125 mg/dL — ABNORMAL HIGH (ref 70–99)
Potassium: 4.5 mEq/L (ref 3.5–5.1)
Sodium: 135 mEq/L (ref 135–145)

## 2022-03-09 LAB — HEMOGLOBIN A1C: Hgb A1c MFr Bld: 6.8 % — ABNORMAL HIGH (ref 4.6–6.5)

## 2022-03-14 ENCOUNTER — Encounter: Payer: Self-pay | Admitting: Internal Medicine

## 2022-03-14 ENCOUNTER — Ambulatory Visit (INDEPENDENT_AMBULATORY_CARE_PROVIDER_SITE_OTHER): Payer: PPO | Admitting: Internal Medicine

## 2022-03-14 ENCOUNTER — Other Ambulatory Visit: Payer: Self-pay

## 2022-03-14 ENCOUNTER — Other Ambulatory Visit (HOSPITAL_COMMUNITY): Payer: Self-pay

## 2022-03-14 VITALS — BP 110/78 | HR 75 | Temp 97.9°F | Ht 68.0 in | Wt 174.0 lb

## 2022-03-14 DIAGNOSIS — M545 Low back pain, unspecified: Secondary | ICD-10-CM | POA: Diagnosis not present

## 2022-03-14 DIAGNOSIS — E1122 Type 2 diabetes mellitus with diabetic chronic kidney disease: Secondary | ICD-10-CM

## 2022-03-14 DIAGNOSIS — N182 Chronic kidney disease, stage 2 (mild): Secondary | ICD-10-CM | POA: Diagnosis not present

## 2022-03-14 DIAGNOSIS — M48062 Spinal stenosis, lumbar region with neurogenic claudication: Secondary | ICD-10-CM

## 2022-03-14 DIAGNOSIS — G8929 Other chronic pain: Secondary | ICD-10-CM | POA: Diagnosis not present

## 2022-03-14 MED ORDER — HYDROCODONE-ACETAMINOPHEN 5-325 MG PO TABS
1.0000 | ORAL_TABLET | Freq: Four times a day (QID) | ORAL | 0 refills | Status: DC | PRN
  Filled 2022-03-14 (×2): qty 120, 30d supply, fill #0

## 2022-03-14 NOTE — Patient Instructions (Signed)
Blue-Emu cream was recommended to use 2-3 times a day ? ?

## 2022-03-14 NOTE — Progress Notes (Signed)
Subjective:  Patient ID: Douglas Richards, male    DOB: September 12, 1945  Age: 77 y.o. MRN: [id number]  CC: Follow-up   HPI Douglas Richards presents for HTN, LBP, DM f/u  Outpatient Medications Prior to Visit  Medication Sig Dispense Refill   allopurinol (ZYLOPRIM) 100 MG tablet Take 1/2 tablet (50 mg total) by mouth daily. 90 tablet 1   amLODipine (NORVASC) 5 MG tablet TAKE 1 TABLET BY MOUTH DAILY. 90 tablet 3   ASPIRIN 81 PO Take by mouth.     Cholecalciferol (VITAMIN D3) 1000 UNITS tablet Take 1,000 Units by mouth daily.     Glucosamine-Chondroitin 750-600 MG TABS Take by mouth 2 (two) times daily. Reported on 04/28/2015     glucose blood (FREESTYLE LITE) test strip USE TO CHECK BLOOD SUGAR TWICE DAILY 100 strip 5   Lancets (FREESTYLE) lancets USE TO CHECK BLOOD SUGAR TWICE DAILY. Annual appt is due w/lab must see provider for future refills 100 each 5   loratadine (CLARITIN) 10 MG tablet Take 10 mg by mouth daily.     metFORMIN (GLUCOPHAGE) 500 MG tablet Take 1 tablet by mouth 2 times daily with a meal. 180 tablet 3   methocarbamol (ROBAXIN) 500 MG tablet Take 1 tablet (500 mg total) by mouth every 8 (eight) hours as needed. 30 tablet 0   MINERAL OIL PO Take by mouth.     omeprazole (PRILOSEC) 20 MG capsule Take 1 capsule (20 mg total) by mouth daily. 90 capsule 3   Psyllium (METAMUCIL PO) Take by mouth.     repaglinide (PRANDIN) 1 MG tablet Take 1 tablet (1 mg total) by mouth 3 (three) times daily before meals. 270 tablet 3   rosuvastatin (CRESTOR) 10 MG tablet Take 1 tablet (10 mg total) by mouth daily. 90 tablet 3   Saw Palmetto, Serenoa repens, (SAW PALMETTO PO) Take by mouth.     tamsulosin (FLOMAX) 0.4 MG CAPS capsule Take 1 capsule (0.4 mg total) by mouth daily. 90 capsule 3   thiamine (VITAMIN B-1) 100 MG tablet Take 100 mg by mouth daily.     trandolapril (MAVIK) 4 MG tablet TAKE 1 TABLET BY MOUTH ONCE DAILY 90 tablet 3   HYDROcodone-acetaminophen (NORCO/VICODIN) 5-325 MG tablet  Take 1 tablet by mouth every 6 (six) hours as needed for severe pain. 120 tablet 0   No facility-administered medications prior to visit.    ROS: Review of Systems  Constitutional:  Negative for appetite change, fatigue and unexpected weight change.  HENT:  Negative for congestion, nosebleeds, sneezing, sore throat and trouble swallowing.   Eyes:  Negative for itching and visual disturbance.  Respiratory:  Negative for cough.   Cardiovascular:  Negative for chest pain, palpitations and leg swelling.  Gastrointestinal:  Negative for abdominal distention, blood in stool, diarrhea and nausea.  Genitourinary:  Negative for frequency and hematuria.  Musculoskeletal:  Positive for back pain. Negative for gait problem, joint swelling and neck pain.  Skin:  Negative for rash.  Neurological:  Negative for dizziness, tremors, speech difficulty and weakness.  Psychiatric/Behavioral:  Negative for agitation, dysphoric mood and sleep disturbance. The patient is not nervous/anxious.     Objective:  BP 110/78 (BP Location: Left Arm, Patient Position: Sitting, Cuff Size: Normal)   Pulse 75   Temp 97.9 F (36.6 C) (Oral)   Ht '5\' 8"'$  (1.727 m)   Wt 174 lb (78.9 kg)   SpO2 98%   BMI 26.46 kg/m   BP Readings from Last  3 Encounters:  03/14/22 110/78  12/08/21 134/70  09/07/21 110/60    Wt Readings from Last 3 Encounters:  03/14/22 174 lb (78.9 kg)  03/06/22 175 lb (79.4 kg)  12/08/21 175 lb 12.8 oz (79.7 kg)    Physical Exam Constitutional:      General: He is not in acute distress.    Appearance: He is well-developed. He is obese.     Comments: NAD  Eyes:     Conjunctiva/sclera: Conjunctivae normal.     Pupils: Pupils are equal, round, and reactive to light.  Neck:     Thyroid: No thyromegaly.     Vascular: No JVD.  Cardiovascular:     Rate and Rhythm: Normal rate and regular rhythm.     Heart sounds: Normal heart sounds. No murmur heard.    No friction rub. No gallop.   Pulmonary:     Effort: Pulmonary effort is normal. No respiratory distress.     Breath sounds: Normal breath sounds. No wheezing or rales.  Chest:     Chest wall: No tenderness.  Abdominal:     General: Bowel sounds are normal. There is no distension.     Palpations: Abdomen is soft. There is no mass.     Tenderness: There is no abdominal tenderness. There is no guarding or rebound.  Musculoskeletal:        General: Tenderness present. Normal range of motion.     Cervical back: Normal range of motion.  Lymphadenopathy:     Cervical: No cervical adenopathy.  Skin:    General: Skin is warm and dry.     Findings: No rash.  Neurological:     Mental Status: He is alert and oriented to person, place, and time.     Cranial Nerves: No cranial nerve deficit.     Motor: No abnormal muscle tone.     Coordination: Coordination normal.     Gait: Gait normal.     Deep Tendon Reflexes: Reflexes are normal and symmetric.  Psychiatric:        Behavior: Behavior normal.        Thought Content: Thought content normal.        Judgment: Judgment normal.     Lab Results  Component Value Date   WBC 6.0 09/17/2015   HGB 13.5 09/17/2015   HCT 40.5 09/17/2015   PLT 151.0 09/17/2015   GLUCOSE 125 (H) 03/09/2022   CHOL 122 09/17/2015   TRIG 150.0 (H) 09/17/2015   HDL 40.30 09/17/2015   LDLDIRECT 105.5 09/10/2013   LDLCALC 51 09/17/2015   ALT 21 12/01/2021   AST 19 12/01/2021   NA 135 03/09/2022   K 4.5 03/09/2022   CL 99 03/09/2022   CREATININE 1.52 (H) 03/09/2022   BUN 26 (H) 03/09/2022   CO2 26 03/09/2022   TSH 2.65 09/17/2015   PSA 0.81 09/17/2015   HGBA1C 6.8 (H) 03/09/2022   MICROALBUR 0.4 03/31/2010    MR Lumbar Spine Wo Contrast  Result Date: 11/08/2020 CLINICAL DATA:  Persistent low back pain radiating down left leg since July EXAM: MRI LUMBAR SPINE WITHOUT CONTRAST TECHNIQUE: Multiplanar, multisequence MR imaging of the lumbar spine was performed. No intravenous contrast was  administered. COMPARISON:  Lumbar spine radiographs 08/25/2020, CT abdomen/pelvis 11/04/2014 FINDINGS: Segmentation: Standard; the lowest formed disc space is designated L5-S1 Alignment: There is straightening of the normal lumbar spine lordosis. There is trace retrolisthesis of L5 on S1, similar to the prior CT from 2016. There is no other  anterior or retrolisthesis. Vertebrae: Vertebral body heights are preserved. There is mild degenerative endplate marrow signal abnormality posteriorly at L3-L4. Otherwise, there is no focal marrow signal abnormality. Conus medullaris and cauda equina: Conus extends to the L1-L2 level. Conus and cauda equina appear normal. Paraspinal and other soft tissues: A small T2 hyperintense lesion in the right kidney is incompletely characterized but likely reflects a cyst. The paraspinal soft tissues are unremarkable. Disc levels: There is mild multilevel disc desiccation and narrowing. There is multilevel facet arthropathy, most advanced at L4-L5. There are associated trace facet joint effusions. T12-L1: No significant spinal canal or neural foraminal stenosis. L1-L2: There is a minimal disc protrusion without significant spinal canal or neural foraminal stenosis. L2-L3: There is a mild disc bulge mild ligamentum flavum thickening, and mild bilateral facet arthropathy without significant spinal canal or neural foraminal stenosis. L3-L4: There is a diffuse disc bulge with a superimposed superiorly migrated central disc extrusion, bulky ligamentum flavum thickening, and bilateral facet arthropathy resulting in mild-to-moderate spinal canal stenosis with narrowing of the bilateral subarticular zones, right worse than left, without definite evidence of frank nerve root impingement. There is mild right worse than left neural foraminal stenosis. L4-L5: There is a diffuse disc bulge, ligamentum flavum thickening, and bilateral facet arthropathy resulting in severe spinal canal stenosis with  compression of the cauda equina nerve roots particularly involving the traversing nerve roots in the right subarticular zone and mild to moderate right and mild left neural foraminal stenosis. L5-S1: There is a mild disc bulge and bilateral facet arthropathy resulting in mild crowding of the subarticular zones without evidence of nerve root impingement and mild to moderate right and mild left neural foraminal stenosis. IMPRESSION: 1. 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 Mild to moderate right and mild left neural foraminal stenosis at this level. 2. Diffuse disc bulge with a small superimposed superiorly migrated extrusion at L3-L4 resulting in mild-to-moderate spinal canal stenosis with crowding of the bilateral subarticular zones without definite evidence of nerve root impingement. Mild right worse than left neural foraminal stenosis at this level. 3. Mild-to-moderate right and mild left neural foraminal stenosis at L5-S1. 4. 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 Electronically Signed   By: Valetta Mole M.D.   On: 11/08/2020 10:18    Assessment & Plan:   Problem List Items Addressed This Visit       Endocrine   DM2 (diabetes mellitus, type 2) ([place]) - Primary    On Metformin, Prandin      Relevant Orders   Hemoglobin A1c   Comprehensive metabolic panel     Other   Spinal stenosis of lumbar region with neurogenic claudication    Steroid injections q 6 mo No NSAIDs due to CRI Blue-Emu cream was recommended to use 2-3 times a day Norco prn  Potential benefits of a long term opioids use as well as potential risks (i.e. addiction risk, apnea etc) and complications (i.e. Somnolence, constipation and others) were explained to the patient and  were aknowledged.      Relevant Medications   HYDROcodone-acetaminophen (NORCO/VICODIN) 5-325 MG tablet   Low back pain    No NSAIDs due to CRI Blue-Emu cream was recommended to use 2-3 times a day Norco prn  Potential benefits of a long term opioids use  as well as potential risks (i.e. addiction risk, apnea etc) and complications (i.e. Somnolence, constipation and others) were explained to the patient and were aknowledged.  Steroid injections q 3 mo      Relevant Medications   HYDROcodone-acetaminophen (NORCO/VICODIN) 5-325 MG tablet   Other Relevant Orders   Hemoglobin A1c   Comprehensive metabolic panel      Meds ordered this encounter  Medications   HYDROcodone-acetaminophen (NORCO/VICODIN) 5-325 MG tablet    Sig: Take 1 tablet by mouth every 6 (six) hours as needed for severe pain.    Dispense:  120 tablet    Refill:  0    Please fill on or after 01/19/22      Follow-up: Return in about 4 months (around 07/13/2022) for a follow-up visit.  [name], MD

## 2022-03-14 NOTE — Assessment & Plan Note (Signed)
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 addiction risk, apnea etc) and complications (i.e. Somnolence, constipation and others) were explained to the patient and were aknowledged.

## 2022-03-14 NOTE — Assessment & Plan Note (Signed)
On Metformin, Prandin

## 2022-03-14 NOTE — Assessment & Plan Note (Addendum)
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 addiction risk, apnea etc) and complications (i.e. Somnolence, constipation and others) were explained to the patient and were aknowledged.  Steroid injections q 3 mo

## 2022-03-19 ENCOUNTER — Other Ambulatory Visit (HOSPITAL_COMMUNITY): Payer: Self-pay

## 2022-03-20 ENCOUNTER — Other Ambulatory Visit (HOSPITAL_COMMUNITY): Payer: Self-pay

## 2022-03-21 ENCOUNTER — Other Ambulatory Visit: Payer: Self-pay

## 2022-03-23 DIAGNOSIS — M5116 Intervertebral disc disorders with radiculopathy, lumbar region: Secondary | ICD-10-CM | POA: Diagnosis not present

## 2022-03-23 DIAGNOSIS — M5416 Radiculopathy, lumbar region: Secondary | ICD-10-CM | POA: Diagnosis not present

## 2022-04-09 ENCOUNTER — Other Ambulatory Visit (HOSPITAL_COMMUNITY): Payer: Self-pay

## 2022-04-16 ENCOUNTER — Other Ambulatory Visit (HOSPITAL_COMMUNITY): Payer: Self-pay

## 2022-05-14 ENCOUNTER — Other Ambulatory Visit (HOSPITAL_COMMUNITY): Payer: Self-pay

## 2022-05-21 ENCOUNTER — Other Ambulatory Visit (HOSPITAL_COMMUNITY): Payer: Self-pay

## 2022-05-29 ENCOUNTER — Other Ambulatory Visit (HOSPITAL_COMMUNITY): Payer: Self-pay

## 2022-05-29 ENCOUNTER — Other Ambulatory Visit: Payer: Self-pay | Admitting: Internal Medicine

## 2022-05-30 ENCOUNTER — Other Ambulatory Visit (HOSPITAL_COMMUNITY): Payer: Self-pay

## 2022-05-30 MED ORDER — HYDROCODONE-ACETAMINOPHEN 5-325 MG PO TABS
1.0000 | ORAL_TABLET | Freq: Four times a day (QID) | ORAL | 0 refills | Status: DC | PRN
  Filled 2022-05-30: qty 120, 30d supply, fill #0

## 2022-06-15 DIAGNOSIS — M5116 Intervertebral disc disorders with radiculopathy, lumbar region: Secondary | ICD-10-CM | POA: Diagnosis not present

## 2022-06-15 DIAGNOSIS — M5416 Radiculopathy, lumbar region: Secondary | ICD-10-CM | POA: Diagnosis not present

## 2022-07-05 ENCOUNTER — Ambulatory Visit (INDEPENDENT_AMBULATORY_CARE_PROVIDER_SITE_OTHER): Payer: PPO | Admitting: Internal Medicine

## 2022-07-05 ENCOUNTER — Encounter: Payer: Self-pay | Admitting: Internal Medicine

## 2022-07-05 ENCOUNTER — Other Ambulatory Visit (HOSPITAL_COMMUNITY): Payer: Self-pay

## 2022-07-05 VITALS — BP 120/72 | HR 72 | Temp 98.3°F | Ht 68.0 in | Wt 170.0 lb

## 2022-07-05 DIAGNOSIS — I10 Essential (primary) hypertension: Secondary | ICD-10-CM | POA: Diagnosis not present

## 2022-07-05 DIAGNOSIS — K219 Gastro-esophageal reflux disease without esophagitis: Secondary | ICD-10-CM | POA: Insufficient documentation

## 2022-07-05 DIAGNOSIS — Z7984 Long term (current) use of oral hypoglycemic drugs: Secondary | ICD-10-CM

## 2022-07-05 DIAGNOSIS — K2970 Gastritis, unspecified, without bleeding: Secondary | ICD-10-CM | POA: Diagnosis not present

## 2022-07-05 DIAGNOSIS — E1122 Type 2 diabetes mellitus with diabetic chronic kidney disease: Secondary | ICD-10-CM

## 2022-07-05 DIAGNOSIS — N182 Chronic kidney disease, stage 2 (mild): Secondary | ICD-10-CM

## 2022-07-05 LAB — HEPATIC FUNCTION PANEL
ALT: 24 U/L (ref 0–53)
AST: 18 U/L (ref 0–37)
Albumin: 4.5 g/dL (ref 3.5–5.2)
Alkaline Phosphatase: 59 U/L (ref 39–117)
Bilirubin, Direct: 0.2 mg/dL (ref 0.0–0.3)
Total Bilirubin: 0.7 mg/dL (ref 0.2–1.2)
Total Protein: 7.5 g/dL (ref 6.0–8.3)

## 2022-07-05 LAB — URINALYSIS, ROUTINE W REFLEX MICROSCOPIC
Bilirubin Urine: NEGATIVE
Hgb urine dipstick: NEGATIVE
Ketones, ur: NEGATIVE
Nitrite: NEGATIVE
RBC / HPF: NONE SEEN (ref 0–?)
Specific Gravity, Urine: 1.01 (ref 1.000–1.030)
Total Protein, Urine: NEGATIVE
Urine Glucose: NEGATIVE
Urobilinogen, UA: 0.2 (ref 0.0–1.0)
pH: 5.5 (ref 5.0–8.0)

## 2022-07-05 LAB — LIPID PANEL
Cholesterol: 111 mg/dL (ref 0–200)
HDL: 49.7 mg/dL (ref >39.00)
LDL Cholesterol: 38 mg/dL (ref 0–99)
NonHDL: 61.17
Total CHOL/HDL Ratio: 2
Triglycerides: 118 mg/dL (ref 0.0–149.0)
VLDL: 23.6 mg/dL (ref 0.0–40.0)

## 2022-07-05 LAB — CBC WITH DIFFERENTIAL/PLATELET
Basophils Absolute: 0 10*3/uL (ref 0.0–0.1)
Basophils Relative: 0.5 % (ref 0.0–3.0)
Eosinophils Absolute: 0.1 10*3/uL (ref 0.0–0.7)
Eosinophils Relative: 0.9 % (ref 0.0–5.0)
HCT: 41.9 % (ref 39.0–52.0)
Hemoglobin: 14.2 g/dL (ref 13.0–17.0)
Lymphocytes Relative: 23.4 % (ref 12.0–46.0)
Lymphs Abs: 1.9 10*3/uL (ref 0.7–4.0)
MCHC: 33.9 g/dL (ref 30.0–36.0)
MCV: 92.3 fl (ref 78.0–100.0)
Monocytes Absolute: 0.8 10*3/uL (ref 0.1–1.0)
Monocytes Relative: 9.8 % (ref 3.0–12.0)
Neutro Abs: 5.3 10*3/uL (ref 1.4–7.7)
Neutrophils Relative %: 65.4 % (ref 43.0–77.0)
Platelets: 160 10*3/uL (ref 150.0–400.0)
RBC: 4.54 Mil/uL (ref 4.22–5.81)
RDW: 12.5 % (ref 11.5–15.5)
WBC: 8.2 10*3/uL (ref 4.0–10.5)

## 2022-07-05 LAB — BASIC METABOLIC PANEL
BUN: 17 mg/dL (ref 6–23)
CO2: 26 mEq/L (ref 19–32)
Calcium: 9.7 mg/dL (ref 8.4–10.5)
Chloride: 93 mEq/L — ABNORMAL LOW (ref 96–112)
Creatinine, Ser: 1.53 mg/dL — ABNORMAL HIGH (ref 0.40–1.50)
GFR: 43.81 mL/min — ABNORMAL LOW (ref >60.00)
Glucose, Bld: 90 mg/dL (ref 70–99)
Potassium: 5.2 mEq/L — ABNORMAL HIGH (ref 3.5–5.1)
Sodium: 127 mEq/L — ABNORMAL LOW (ref 135–145)

## 2022-07-05 LAB — TSH: TSH: 1.45 u[IU]/mL (ref 0.35–5.50)

## 2022-07-05 LAB — HEMOGLOBIN A1C: Hgb A1c MFr Bld: 7.5 % — ABNORMAL HIGH (ref 4.6–6.5)

## 2022-07-05 LAB — LIPASE: Lipase: 27 U/L (ref 11.0–59.0)

## 2022-07-05 MED ORDER — SUCRALFATE 1 G PO TABS
1.0000 g | ORAL_TABLET | Freq: Three times a day (TID) | ORAL | 0 refills | Status: DC
  Filled 2022-07-05 (×2): qty 120, 30d supply, fill #0

## 2022-07-05 MED ORDER — PANTOPRAZOLE SODIUM 40 MG PO TBEC
40.0000 mg | DELAYED_RELEASE_TABLET | Freq: Every day | ORAL | 3 refills | Status: DC
  Filled 2022-07-05 (×2): qty 30, 30d supply, fill #0

## 2022-07-05 NOTE — Assessment & Plan Note (Addendum)
Mild, for carafate 1 gm qid,  to f/u any worsening symptoms or concerns , for h pylori Igg, and labs including cbc

## 2022-07-05 NOTE — Assessment & Plan Note (Signed)
BP Readings from Last 3 Encounters:  07/05/22 120/72  03/14/22 110/78  12/08/21 134/70   Stable, pt to continue medical treatment norvasc 5 mg qd, trandolapril 4 qd

## 2022-07-05 NOTE — Assessment & Plan Note (Signed)
Lab Results  Component Value Date   HGBA1C 6.8 (H) 03/09/2022   Stable, pt to continue current medical treatment metfomrin 500 bid, prandin 1 mg tid

## 2022-07-05 NOTE — Assessment & Plan Note (Signed)
With recent marked worsening - for protonix 40 qd

## 2022-07-05 NOTE — Patient Instructions (Signed)
Please take all new medication as prescribed - the protonix 40 mg per day, and carafate for the stomach  Please continue all other medications as before, and refills have been done if requested.  Please have the pharmacy call with any other refills you may need.  Please keep your appointments with your specialists as you may have planned  Please call next Monday if not improved for a GI referral, or see Dr Posey Rea next wk as you have planned  Please go to the LAB at the blood drawing area for the tests to be done  You will be contacted by phone if any changes need to be made immediately.  Otherwise, you will receive a letter about your results with an explanation, but please check with MyChart first.  Please remember to sign up for MyChart if you have not done so, as this will be important to you in the future with finding out test results, communicating by private email, and scheduling acute appointments online when needed.

## 2022-07-05 NOTE — Progress Notes (Signed)
Patient ID: Douglas Richards, male   DOB: 1945/03/14, 77 y.o.   MRN: [id number]        Chief Complaint: follow up frank onset epigastric discomfort and reflux, dm, htn       HPI:  Douglas Richards is a 77 y.o. male here with c/o 1 wk onset frank reflux and sour brash, hoarseness, along with epigastric discomfort and nausea.  No vomiting, fever, chills, and Denies worsening dysphagia, bowel change or blood. Better with TUMS prn,   Pt denies chest pain, increased sob or doe, wheezing, orthopnea, PND, increased LE swelling, palpitations, dizziness or syncope.   Pt denies polydipsia, polyuria, or new focal neuro s/s.    Pt denies fever, night sweats, loss of appetite, or other constitutional symptoms  except has lost wt with his current symptoms.  Has appt to f/u with PCP in 1 wk.         Wt Readings from Last 3 Encounters:  07/05/22 170 lb (77.1 kg)  03/14/22 174 lb (78.9 kg)  03/06/22 175 lb (79.4 kg)   BP Readings from Last 3 Encounters:  07/05/22 120/72  03/14/22 110/78  12/08/21 134/70         Past Medical History:  Diagnosis Date   Allergic rhinitis    Allergy    Anal fissure    Cataract    right eye    Chronic kidney disease    Stage 3   Diabetes mellitus 02/06/2009   type II   Diverticulosis of colon (without mention of hemorrhage)    GERD (gastroesophageal reflux disease)    Gout, unspecified    Herpes zoster without mention of complication    Hyperlipidemia    Hypertension    Osteoarthrosis, unspecified whether generalized or localized, unspecified site    Other abnormal glucose    Personal history of colonic polyps    Past Surgical History:  Procedure Laterality Date   CATARACT EXTRACTION Right    COLONOSCOPY  01/15/2014   HEMORRHOID SURGERY     LIPOMA EXCISION     from the neck   POLYPECTOMY     rotator cuff surgery Right    TONSILLECTOMY      reports that he has never smoked. He has never used smokeless tobacco. He reports that he does not currently use alcohol.  He reports that he does not use drugs. family history includes Alcohol abuse in his brother and father; Arthritis in his brother; COPD in his father and mother; Diabetes in his father; Heart disease in his brother and sister; Hypertension in his father, mother, and another family member; Stroke in his father and mother. Allergies  Allergen Reactions   Atorvastatin     REACTION: aches   Prednisolone Swelling    Red swollen face He had cortisone knee shot and was ok   Sulfonamide Derivatives     REACTION: nausea   Trulicity [Dulaglutide]     ?pancreatitis   Current Outpatient Medications on File Prior to Visit  Medication Sig Dispense Refill   allopurinol (ZYLOPRIM) 100 MG tablet Take 1/2 tablet (50 mg total) by mouth daily. 90 tablet 1   amLODipine (NORVASC) 5 MG tablet TAKE 1 TABLET BY MOUTH DAILY. 90 tablet 3   ASPIRIN 81 PO Take by mouth.     Cholecalciferol (VITAMIN D3) 1000 UNITS tablet Take 1,000 Units by mouth daily.     Glucosamine-Chondroitin 750-600 MG TABS Take by mouth 2 (two) times daily. Reported on 04/28/2015     glucose  blood (FREESTYLE LITE) test strip USE TO CHECK BLOOD SUGAR TWICE DAILY 100 strip 5   HYDROcodone-acetaminophen (NORCO/VICODIN) 5-325 MG tablet Take 1 tablet by mouth every 6 (six) hours as needed for severe pain. 120 tablet 0   Lancets (FREESTYLE) lancets USE TO CHECK BLOOD SUGAR TWICE DAILY. Annual appt is due w/lab must see provider for future refills 100 each 5   loratadine (CLARITIN) 10 MG tablet Take 10 mg by mouth daily.     metFORMIN (GLUCOPHAGE) 500 MG tablet Take 1 tablet by mouth 2 times daily with a meal. 180 tablet 3   methocarbamol (ROBAXIN) 500 MG tablet Take 1 tablet (500 mg total) by mouth every 8 (eight) hours as needed. 30 tablet 0   MINERAL OIL PO Take by mouth.     omeprazole (PRILOSEC) 20 MG capsule Take 1 capsule (20 mg total) by mouth daily. 90 capsule 3   Psyllium (METAMUCIL PO) Take by mouth.     repaglinide (PRANDIN) 1 MG tablet  Take 1 tablet (1 mg total) by mouth 3 (three) times daily before meals. 270 tablet 3   rosuvastatin (CRESTOR) 10 MG tablet Take 1 tablet (10 mg total) by mouth daily. 90 tablet 3   Saw Palmetto, Serenoa repens, (SAW PALMETTO PO) Take by mouth.     tamsulosin (FLOMAX) 0.4 MG CAPS capsule Take 1 capsule (0.4 mg total) by mouth daily. 90 capsule 3   thiamine (VITAMIN B-1) 100 MG tablet Take 100 mg by mouth daily.     trandolapril (MAVIK) 4 MG tablet TAKE 1 TABLET BY MOUTH ONCE DAILY 90 tablet 3   No current facility-administered medications on file prior to visit.        ROS:  All others reviewed and negative.  Objective        PE:  BP 120/72 (BP Location: Right Arm, Patient Position: Sitting, Cuff Size: Normal)   Pulse 72   Temp 98.3 F (36.8 C) (Oral)   Ht 5\' 8"  (1.727 m)   Wt 170 lb (77.1 kg)   SpO2 98%   BMI 25.85 kg/m                 Constitutional: Pt appears in NAD               HENT: Head: NCAT.                Right Ear: External ear normal.                 Left Ear: External ear normal.                Eyes: . Pupils are equal, round, and reactive to light. Conjunctivae and EOM are normal               Nose: without d/c or deformity               Neck: Neck supple. Gross normal ROM               Cardiovascular: Normal rate and regular rhythm.                 Pulmonary/Chest: Effort normal and breath sounds without rales or wheezing.                Abd:  Soft, NT, ND, + BS, no organomegaly               Neurological: Pt is alert. At baseline orientation, motor grossly intact  Skin: Skin is warm. No rashes, no other new lesions, LE edema - none               Psychiatric: Pt behavior is normal without agitation   Micro: none  Cardiac tracings I have personally interpreted today:  none  Pertinent Radiological findings (summarize): none   Lab Results  Component Value Date   WBC 6.0 09/17/2015   HGB 13.5 09/17/2015   HCT 40.5 09/17/2015   PLT 151.0  09/17/2015   GLUCOSE 125 (H) 03/09/2022   CHOL 122 09/17/2015   TRIG 150.0 (H) 09/17/2015   HDL 40.30 09/17/2015   LDLDIRECT 105.5 09/10/2013   LDLCALC 51 09/17/2015   ALT 21 12/01/2021   AST 19 12/01/2021   NA 135 03/09/2022   K 4.5 03/09/2022   CL 99 03/09/2022   CREATININE 1.52 (H) 03/09/2022   BUN 26 (H) 03/09/2022   CO2 26 03/09/2022   TSH 2.65 09/17/2015   PSA 0.81 09/17/2015   HGBA1C 6.8 (H) 03/09/2022   MICROALBUR 0.4 03/31/2010   Assessment/Plan:  Douglas Richards is a 77 y.o. White or Caucasian [1] male with  has a past medical history of Allergic rhinitis, Allergy, Anal fissure, Cataract, Chronic kidney disease, Diabetes mellitus (02/06/2009), Diverticulosis of colon (without mention of hemorrhage), GERD (gastroesophageal reflux disease), Gout, unspecified, Herpes zoster without mention of complication, Hyperlipidemia, Hypertension, Osteoarthrosis, unspecified whether generalized or localized, unspecified site, Other abnormal glucose, and Personal history of colonic polyps.  Gastritis Mild, for carafate 1 gm qid,  to f/u any worsening symptoms or concerns , for h pylori Igg, and labs including cbc  GERD (gastroesophageal reflux disease) With recent marked worsening - for protonix 40 qd  Essential hypertension BP Readings from Last 3 Encounters:  07/05/22 120/72  03/14/22 110/78  12/08/21 134/70   Stable, pt to continue medical treatment norvasc 5 mg qd, trandolapril 4 qd   DM2 (diabetes mellitus, type 2) (HCC) Lab Results  Component Value Date   HGBA1C 6.8 (H) 03/09/2022   Stable, pt to continue current medical treatment metfomrin 500 bid, prandin 1 mg tid  Followup: Return if symptoms worsen or fail to improve.  Oliver Barre, MD 07/05/2022 9:08 PM [facility]

## 2022-07-06 LAB — H. PYLORI ANTIBODY, IGG: H Pylori IgG: NEGATIVE

## 2022-07-07 ENCOUNTER — Other Ambulatory Visit: Payer: Self-pay | Admitting: Internal Medicine

## 2022-07-07 ENCOUNTER — Other Ambulatory Visit: Payer: Self-pay

## 2022-07-07 ENCOUNTER — Other Ambulatory Visit (HOSPITAL_COMMUNITY): Payer: Self-pay

## 2022-07-07 MED ORDER — TRANDOLAPRIL 4 MG PO TABS
4.0000 mg | ORAL_TABLET | Freq: Every day | ORAL | 1 refills | Status: DC
  Filled 2022-07-07: qty 90, 90d supply, fill #0
  Filled 2022-10-08: qty 90, 90d supply, fill #1

## 2022-07-07 MED ORDER — AMLODIPINE BESYLATE 5 MG PO TABS
5.0000 mg | ORAL_TABLET | Freq: Every day | ORAL | 1 refills | Status: DC
  Filled 2022-07-07: qty 90, 90d supply, fill #0
  Filled 2022-10-08: qty 90, 90d supply, fill #1

## 2022-07-07 MED ORDER — METFORMIN HCL 1000 MG PO TABS
1000.0000 mg | ORAL_TABLET | Freq: Two times a day (BID) | ORAL | 3 refills | Status: DC
  Filled 2022-07-07: qty 180, 90d supply, fill #0

## 2022-07-08 ENCOUNTER — Other Ambulatory Visit (HOSPITAL_COMMUNITY): Payer: Self-pay

## 2022-07-13 ENCOUNTER — Ambulatory Visit (INDEPENDENT_AMBULATORY_CARE_PROVIDER_SITE_OTHER): Payer: PPO | Admitting: Internal Medicine

## 2022-07-13 ENCOUNTER — Other Ambulatory Visit: Payer: Self-pay

## 2022-07-13 ENCOUNTER — Encounter: Payer: Self-pay | Admitting: Internal Medicine

## 2022-07-13 VITALS — BP 120/70 | HR 75 | Temp 98.6°F | Ht 68.0 in | Wt 184.0 lb

## 2022-07-13 DIAGNOSIS — E1122 Type 2 diabetes mellitus with diabetic chronic kidney disease: Secondary | ICD-10-CM | POA: Diagnosis not present

## 2022-07-13 DIAGNOSIS — G8929 Other chronic pain: Secondary | ICD-10-CM | POA: Diagnosis not present

## 2022-07-13 DIAGNOSIS — M545 Low back pain, unspecified: Secondary | ICD-10-CM | POA: Diagnosis not present

## 2022-07-13 DIAGNOSIS — K2970 Gastritis, unspecified, without bleeding: Secondary | ICD-10-CM | POA: Diagnosis not present

## 2022-07-13 DIAGNOSIS — K219 Gastro-esophageal reflux disease without esophagitis: Secondary | ICD-10-CM

## 2022-07-13 DIAGNOSIS — Z7984 Long term (current) use of oral hypoglycemic drugs: Secondary | ICD-10-CM

## 2022-07-13 DIAGNOSIS — N182 Chronic kidney disease, stage 2 (mild): Secondary | ICD-10-CM | POA: Diagnosis not present

## 2022-07-13 DIAGNOSIS — S40022S Contusion of left upper arm, sequela: Secondary | ICD-10-CM

## 2022-07-13 MED ORDER — METFORMIN HCL 500 MG PO TABS
500.0000 mg | ORAL_TABLET | Freq: Two times a day (BID) | ORAL | 3 refills | Status: DC
  Filled 2022-07-13: qty 180, 90d supply, fill #0
  Filled 2022-10-08: qty 180, 90d supply, fill #1
  Filled 2023-01-21: qty 180, 90d supply, fill #2
  Filled 2023-04-15: qty 180, 90d supply, fill #3

## 2022-07-13 MED ORDER — PANTOPRAZOLE SODIUM 40 MG PO TBEC
40.0000 mg | DELAYED_RELEASE_TABLET | Freq: Two times a day (BID) | ORAL | 5 refills | Status: DC
  Filled 2022-07-13 – 2022-07-18 (×4): qty 60, 30d supply, fill #0
  Filled 2022-08-15: qty 60, 30d supply, fill #1
  Filled 2022-09-10: qty 60, 30d supply, fill #2
  Filled 2022-10-08: qty 60, 30d supply, fill #3
  Filled 2022-11-05: qty 120, 60d supply, fill #4

## 2022-07-13 NOTE — Assessment & Plan Note (Signed)
Better  

## 2022-07-13 NOTE — Assessment & Plan Note (Signed)
had shots: CBGs were >200; pt developed heartburn, n/v. Pt saw Dr Jonny Ruiz and was given Carafate, Protonix

## 2022-07-13 NOTE — Assessment & Plan Note (Signed)
On Metformin - pt wants to be back on 500 bid On Prandin

## 2022-07-13 NOTE — Progress Notes (Signed)
Subjective:  Patient ID: Douglas Richards, male    DOB: Feb 07, 1945  Age: 77 y.o. MRN: [id number]  CC: Follow-up (4 MNTH F/U)   HPI JAMAIR NOFTZ presents for a fall in May;L arm is bruised F/u LBP - had shots: CBGs were >200; pt developed heartburn, n/v. Pt saw Dr Jonny Ruiz and was given Carafate, Protonix Carafate gives bad taste F/u DM  Outpatient Medications Prior to Visit  Medication Sig Dispense Refill   allopurinol (ZYLOPRIM) 100 MG tablet Take 1/2 tablet (50 mg total) by mouth daily. 90 tablet 1   amLODipine (NORVASC) 5 MG tablet Take 1 tablet (5 mg total) by mouth daily. 90 tablet 1   ASPIRIN 81 PO Take by mouth.     Cholecalciferol (VITAMIN D3) 1000 UNITS tablet Take 1,000 Units by mouth daily.     Glucosamine-Chondroitin 750-600 MG TABS Take by mouth 2 (two) times daily. Reported on 04/28/2015     glucose blood (FREESTYLE LITE) test strip USE TO CHECK BLOOD SUGAR TWICE DAILY 100 strip 5   HYDROcodone-acetaminophen (NORCO/VICODIN) 5-325 MG tablet Take 1 tablet by mouth every 6 (six) hours as needed for severe pain. 120 tablet 0   Lancets (FREESTYLE) lancets USE TO CHECK BLOOD SUGAR TWICE DAILY. Annual appt is due w/lab must see provider for future refills 100 each 5   loratadine (CLARITIN) 10 MG tablet Take 10 mg by mouth daily.     methocarbamol (ROBAXIN) 500 MG tablet Take 1 tablet (500 mg total) by mouth every 8 (eight) hours as needed. 30 tablet 0   MINERAL OIL PO Take by mouth.     omeprazole (PRILOSEC) 20 MG capsule Take 1 capsule (20 mg total) by mouth daily. 90 capsule 3   Psyllium (METAMUCIL PO) Take by mouth.     repaglinide (PRANDIN) 1 MG tablet Take 1 tablet (1 mg total) by mouth 3 (three) times daily before meals. 270 tablet 3   rosuvastatin (CRESTOR) 10 MG tablet Take 1 tablet (10 mg total) by mouth daily. 90 tablet 3   Saw Palmetto, Serenoa repens, (SAW PALMETTO PO) Take by mouth.     tamsulosin (FLOMAX) 0.4 MG CAPS capsule Take 1 capsule (0.4 mg total) by mouth  daily. 90 capsule 3   thiamine (VITAMIN B-1) 100 MG tablet Take 100 mg by mouth daily.     trandolapril (MAVIK) 4 MG tablet Take 1 tablet (4 mg total) by mouth daily. 90 tablet 1   metFORMIN (GLUCOPHAGE) 1000 MG tablet Take 1 tablet (1,000 mg total) by mouth 2 (two) times daily with a meal. 180 tablet 3   pantoprazole (PROTONIX) 40 MG tablet Take 1 tablet (40 mg total) by mouth daily. 30 tablet 3   sucralfate (CARAFATE) 1 g tablet Take 1 tablet (1 g total) by mouth 4 (four) times daily -  with meals and at bedtime. 120 tablet 0   No facility-administered medications prior to visit.    ROS: Review of Systems  Constitutional:  Negative for appetite change, fatigue and unexpected weight change.  HENT:  Negative for congestion, nosebleeds, sneezing, sore throat and trouble swallowing.   Eyes:  Negative for itching and visual disturbance.  Respiratory:  Negative for cough.   Cardiovascular:  Negative for chest pain, palpitations and leg swelling.  Gastrointestinal:  Positive for nausea. Negative for abdominal distention, blood in stool, diarrhea and vomiting.  Genitourinary:  Negative for frequency and hematuria.  Musculoskeletal:  Positive for back pain. Negative for gait problem, joint swelling and neck  pain.  Skin:  Negative for rash.  Neurological:  Negative for dizziness, tremors, speech difficulty and weakness.  Psychiatric/Behavioral:  Negative for agitation, dysphoric mood and sleep disturbance. The patient is not nervous/anxious.     Objective:  BP 120/70 (BP Location: Right Arm, Patient Position: Sitting, Cuff Size: Large)   Pulse 75   Temp 98.6 F (37 C) (Oral)   Ht 5\' 8"  (1.727 m)   Wt 184 lb (83.5 kg)   SpO2 98%   BMI 27.98 kg/m   BP Readings from Last 3 Encounters:  07/13/22 120/70  07/05/22 120/72  03/14/22 110/78    Wt Readings from Last 3 Encounters:  07/13/22 184 lb (83.5 kg)  07/05/22 170 lb (77.1 kg)  03/14/22 174 lb (78.9 kg)    Physical  Exam Constitutional:      General: He is not in acute distress.    Appearance: Normal appearance. He is well-developed.     Comments: NAD  Eyes:     Conjunctiva/sclera: Conjunctivae normal.     Pupils: Pupils are equal, round, and reactive to light.  Neck:     Thyroid: No thyromegaly.     Vascular: No JVD.  Cardiovascular:     Rate and Rhythm: Normal rate and regular rhythm.     Heart sounds: Normal heart sounds. No murmur heard.    No friction rub. No gallop.  Pulmonary:     Effort: Pulmonary effort is normal. No respiratory distress.     Breath sounds: Normal breath sounds. No wheezing or rales.  Chest:     Chest wall: No tenderness.  Abdominal:     General: Bowel sounds are normal. There is no distension.     Palpations: Abdomen is soft. There is no mass.     Tenderness: There is no abdominal tenderness. There is no guarding or rebound.  Musculoskeletal:        General: No tenderness. Normal range of motion.     Cervical back: Normal range of motion.  Lymphadenopathy:     Cervical: No cervical adenopathy.  Skin:    General: Skin is warm and dry.     Findings: No rash.  Neurological:     Mental Status: He is alert and oriented to person, place, and time.     Cranial Nerves: No cranial nerve deficit.     Motor: No abnormal muscle tone.     Coordination: Coordination normal.     Gait: Gait normal.     Deep Tendon Reflexes: Reflexes are normal and symmetric.  Psychiatric:        Behavior: Behavior normal.        Thought Content: Thought content normal.        Judgment: Judgment normal.   Pt looks tired  Lab Results  Component Value Date   WBC 8.2 07/05/2022   HGB 14.2 07/05/2022   HCT 41.9 07/05/2022   PLT 160.0 07/05/2022   GLUCOSE 90 07/05/2022   CHOL 111 07/05/2022   TRIG 118.0 07/05/2022   HDL 49.70 07/05/2022   LDLDIRECT 105.5 09/10/2013   LDLCALC 38 07/05/2022   ALT 24 07/05/2022   AST 18 07/05/2022   NA 127 (L) 07/05/2022   K 5.2 No hemolysis seen  (H) 07/05/2022   CL 93 (L) 07/05/2022   CREATININE 1.53 (H) 07/05/2022   BUN 17 07/05/2022   CO2 26 07/05/2022   TSH 1.45 07/05/2022   PSA 0.81 09/17/2015   HGBA1C 7.5 (H) 07/05/2022   MICROALBUR 0.4 03/31/2010  MR Lumbar Spine Wo Contrast  Result Date: 11/08/2020 CLINICAL DATA:  Persistent low back pain radiating down left leg since July EXAM: MRI LUMBAR SPINE WITHOUT CONTRAST TECHNIQUE: Multiplanar, multisequence MR imaging of the lumbar spine was performed. No intravenous contrast was administered. COMPARISON:  Lumbar spine radiographs 08/25/2020, CT abdomen/pelvis 11/04/2014 FINDINGS: Segmentation: Standard; the lowest formed disc space is designated L5-S1 Alignment: There is straightening of the normal lumbar spine lordosis. There is trace retrolisthesis of L5 on S1, similar to the prior CT from 2016. There is no other anterior or retrolisthesis. Vertebrae: Vertebral body heights are preserved. There is mild degenerative endplate marrow signal abnormality posteriorly at L3-L4. Otherwise, there is no focal marrow signal abnormality. Conus medullaris and cauda equina: Conus extends to the L1-L2 level. Conus and cauda equina appear normal. Paraspinal and other soft tissues: A small T2 hyperintense lesion in the right kidney is incompletely characterized but likely reflects a cyst. The paraspinal soft tissues are unremarkable. Disc levels: There is mild multilevel disc desiccation and narrowing. There is multilevel facet arthropathy, most advanced at L4-L5. There are associated trace facet joint effusions. T12-L1: No significant spinal canal or neural foraminal stenosis. L1-L2: There is a minimal disc protrusion without significant spinal canal or neural foraminal stenosis. L2-L3: There is a mild disc bulge mild ligamentum flavum thickening, and mild bilateral facet arthropathy without significant spinal canal or neural foraminal stenosis. L3-L4: There is a diffuse disc bulge with a superimposed  superiorly migrated central disc extrusion, bulky ligamentum flavum thickening, and bilateral facet arthropathy resulting in mild-to-moderate spinal canal stenosis with narrowing of the bilateral subarticular zones, right worse than left, without definite evidence of frank nerve root impingement. There is mild right worse than left neural foraminal stenosis. L4-L5: There is a diffuse disc bulge, ligamentum flavum thickening, and bilateral facet arthropathy resulting in severe spinal canal stenosis with compression of the cauda equina nerve roots particularly involving the traversing nerve roots in the right subarticular zone and mild to moderate right and mild left neural foraminal stenosis. L5-S1: There is a mild disc bulge and bilateral facet arthropathy resulting in mild crowding of the subarticular zones without evidence of nerve root impingement and mild to moderate right and mild left neural foraminal stenosis. IMPRESSION: 1. 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 Mild to moderate right and mild left neural foraminal stenosis at this level. 2. Diffuse disc bulge with a small superimposed superiorly migrated extrusion at L3-L4 resulting in mild-to-moderate spinal canal stenosis with crowding of the bilateral subarticular zones without definite evidence of nerve root impingement. Mild right worse than left neural foraminal stenosis at this level. 3. Mild-to-moderate right and mild left neural foraminal stenosis at L5-S1. 4. 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 Electronically Signed   By: Lesia Hausen M.D.   On: 11/08/2020 10:18    Assessment & Plan:   Problem List Items Addressed This Visit     DM2 (diabetes mellitus, type 2) (HCC)    On Metformin  - pt wants to be back on 500 bid On Prandin      Relevant Medications   metFORMIN (GLUCOPHAGE) 500 MG tablet   Low back pain - Primary     had shots: CBGs were >200; pt developed heartburn, n/v. Pt saw Dr Jonny Ruiz and was given Carafate,  Protonix      Gastritis    Better       GERD (gastroesophageal reflux disease)    New after LS spine injection. CBGs were >200; pt developed heartburn, n/v. Pt saw Dr Jonny Ruiz and was given Carafate, Protonix daily. Carafate gives bad taste - ok to d/c      Relevant Medications   pantoprazole (PROTONIX) 40 MG tablet   Arm contusion, left, sequela    Arnica cream         Meds ordered this encounter  Medications   pantoprazole (PROTONIX) 40 MG tablet    Sig: Take 1 tablet (40 mg total) by mouth 2 (two) times daily.    Dispense:  60 tablet    Refill:  5   metFORMIN (GLUCOPHAGE) 500 MG tablet    Sig: Take 1 tablet (500 mg total) by mouth 2 (two) times daily with a meal.    Dispense:  180 tablet    Refill:  3      Follow-up: Return in about 3 months (around 10/13/2022) for a follow-up visit.  Sonda Primes, MD

## 2022-07-13 NOTE — Assessment & Plan Note (Signed)
New after LS spine injection. CBGs were >200; pt developed heartburn, n/v. Pt saw Dr Jonny Ruiz and was given Carafate, Protonix daily. Carafate gives bad taste - ok to d/c

## 2022-07-13 NOTE — Assessment & Plan Note (Signed)
Arnica cream 

## 2022-07-14 ENCOUNTER — Other Ambulatory Visit: Payer: Self-pay

## 2022-07-14 ENCOUNTER — Other Ambulatory Visit (HOSPITAL_COMMUNITY): Payer: Self-pay

## 2022-07-17 ENCOUNTER — Other Ambulatory Visit: Payer: Self-pay

## 2022-07-17 ENCOUNTER — Other Ambulatory Visit (HOSPITAL_COMMUNITY): Payer: Self-pay

## 2022-07-18 ENCOUNTER — Other Ambulatory Visit (HOSPITAL_COMMUNITY): Payer: Self-pay

## 2022-07-19 ENCOUNTER — Other Ambulatory Visit (HOSPITAL_COMMUNITY): Payer: Self-pay

## 2022-08-08 ENCOUNTER — Other Ambulatory Visit (HOSPITAL_COMMUNITY): Payer: Self-pay

## 2022-08-14 ENCOUNTER — Other Ambulatory Visit: Payer: Self-pay

## 2022-08-14 ENCOUNTER — Other Ambulatory Visit (HOSPITAL_COMMUNITY): Payer: Self-pay

## 2022-08-15 ENCOUNTER — Other Ambulatory Visit: Payer: Self-pay | Admitting: Internal Medicine

## 2022-08-15 ENCOUNTER — Other Ambulatory Visit (HOSPITAL_COMMUNITY): Payer: Self-pay

## 2022-08-16 MED ORDER — HYDROCODONE-ACETAMINOPHEN 5-325 MG PO TABS
1.0000 | ORAL_TABLET | Freq: Four times a day (QID) | ORAL | 0 refills | Status: DC | PRN
  Filled 2022-08-16: qty 120, 30d supply, fill #0

## 2022-08-17 ENCOUNTER — Other Ambulatory Visit: Payer: Self-pay

## 2022-09-11 ENCOUNTER — Other Ambulatory Visit: Payer: Self-pay

## 2022-09-19 DIAGNOSIS — H43811 Vitreous degeneration, right eye: Secondary | ICD-10-CM | POA: Diagnosis not present

## 2022-09-19 DIAGNOSIS — H4911 Fourth [trochlear] nerve palsy, right eye: Secondary | ICD-10-CM | POA: Diagnosis not present

## 2022-09-19 DIAGNOSIS — H35373 Puckering of macula, bilateral: Secondary | ICD-10-CM | POA: Diagnosis not present

## 2022-09-19 DIAGNOSIS — Z961 Presence of intraocular lens: Secondary | ICD-10-CM | POA: Diagnosis not present

## 2022-09-21 ENCOUNTER — Encounter (INDEPENDENT_AMBULATORY_CARE_PROVIDER_SITE_OTHER): Payer: Self-pay

## 2022-10-03 DIAGNOSIS — M5416 Radiculopathy, lumbar region: Secondary | ICD-10-CM | POA: Diagnosis not present

## 2022-10-03 DIAGNOSIS — M5116 Intervertebral disc disorders with radiculopathy, lumbar region: Secondary | ICD-10-CM | POA: Diagnosis not present

## 2022-10-08 ENCOUNTER — Other Ambulatory Visit: Payer: Self-pay | Admitting: Internal Medicine

## 2022-10-08 ENCOUNTER — Other Ambulatory Visit (HOSPITAL_COMMUNITY): Payer: Self-pay

## 2022-10-10 ENCOUNTER — Other Ambulatory Visit: Payer: Self-pay

## 2022-10-11 ENCOUNTER — Other Ambulatory Visit (HOSPITAL_COMMUNITY): Payer: Self-pay

## 2022-10-11 MED ORDER — METHOCARBAMOL 500 MG PO TABS
500.0000 mg | ORAL_TABLET | Freq: Three times a day (TID) | ORAL | 0 refills | Status: DC | PRN
  Filled 2022-10-11: qty 30, 10d supply, fill #0

## 2022-10-11 MED ORDER — ALLOPURINOL 100 MG PO TABS
50.0000 mg | ORAL_TABLET | Freq: Every day | ORAL | 1 refills | Status: DC
  Filled 2022-10-11: qty 90, 180d supply, fill #0
  Filled 2022-11-05: qty 50, 100d supply, fill #0
  Filled 2023-02-04: qty 50, 100d supply, fill #1
  Filled 2023-05-06: qty 50, 100d supply, fill #2
  Filled 2023-08-12: qty 30, 60d supply, fill #3

## 2022-10-11 MED ORDER — ROSUVASTATIN CALCIUM 10 MG PO TABS
10.0000 mg | ORAL_TABLET | Freq: Every day | ORAL | 3 refills | Status: DC
  Filled 2022-10-11: qty 90, 90d supply, fill #0
  Filled 2023-01-07: qty 90, 90d supply, fill #1
  Filled 2023-04-05: qty 90, 90d supply, fill #2
  Filled 2023-07-01: qty 90, 90d supply, fill #3

## 2022-10-12 ENCOUNTER — Other Ambulatory Visit: Payer: Self-pay

## 2022-10-13 ENCOUNTER — Emergency Department (HOSPITAL_BASED_OUTPATIENT_CLINIC_OR_DEPARTMENT_OTHER)
Admission: EM | Admit: 2022-10-13 | Discharge: 2022-10-13 | Disposition: A | Discharge status: Home / Self Care | Payer: PPO | Attending: Emergency Medicine | Admitting: Emergency Medicine

## 2022-10-13 ENCOUNTER — Encounter (HOSPITAL_BASED_OUTPATIENT_CLINIC_OR_DEPARTMENT_OTHER): Payer: Self-pay

## 2022-10-13 ENCOUNTER — Other Ambulatory Visit (INDEPENDENT_AMBULATORY_CARE_PROVIDER_SITE_OTHER): Payer: PPO

## 2022-10-13 ENCOUNTER — Other Ambulatory Visit: Payer: Self-pay

## 2022-10-13 DIAGNOSIS — Z7982 Long term (current) use of aspirin: Secondary | ICD-10-CM | POA: Insufficient documentation

## 2022-10-13 DIAGNOSIS — E118 Type 2 diabetes mellitus with unspecified complications: Secondary | ICD-10-CM

## 2022-10-13 DIAGNOSIS — N182 Chronic kidney disease, stage 2 (mild): Secondary | ICD-10-CM

## 2022-10-13 DIAGNOSIS — Z794 Long term (current) use of insulin: Secondary | ICD-10-CM | POA: Insufficient documentation

## 2022-10-13 DIAGNOSIS — N183 Chronic kidney disease, stage 3 unspecified: Secondary | ICD-10-CM | POA: Insufficient documentation

## 2022-10-13 DIAGNOSIS — I129 Hypertensive chronic kidney disease with stage 1 through stage 4 chronic kidney disease, or unspecified chronic kidney disease: Secondary | ICD-10-CM | POA: Diagnosis not present

## 2022-10-13 DIAGNOSIS — Z711 Person with feared health complaint in whom no diagnosis is made: Secondary | ICD-10-CM

## 2022-10-13 DIAGNOSIS — E1122 Type 2 diabetes mellitus with diabetic chronic kidney disease: Secondary | ICD-10-CM | POA: Insufficient documentation

## 2022-10-13 DIAGNOSIS — G8929 Other chronic pain: Secondary | ICD-10-CM

## 2022-10-13 DIAGNOSIS — Z7984 Long term (current) use of oral hypoglycemic drugs: Secondary | ICD-10-CM | POA: Diagnosis not present

## 2022-10-13 DIAGNOSIS — I10 Essential (primary) hypertension: Secondary | ICD-10-CM | POA: Diagnosis not present

## 2022-10-13 DIAGNOSIS — M545 Low back pain, unspecified: Secondary | ICD-10-CM

## 2022-10-13 DIAGNOSIS — Z79899 Other long term (current) drug therapy: Secondary | ICD-10-CM | POA: Insufficient documentation

## 2022-10-13 DIAGNOSIS — N179 Acute kidney failure, unspecified: Secondary | ICD-10-CM | POA: Insufficient documentation

## 2022-10-13 DIAGNOSIS — R799 Abnormal finding of blood chemistry, unspecified: Secondary | ICD-10-CM | POA: Diagnosis present

## 2022-10-13 LAB — CBC WITH DIFFERENTIAL/PLATELET
Abs Immature Granulocytes: 0.07 10*3/uL (ref 0.00–0.07)
Basophils Absolute: 0 10*3/uL (ref 0.0–0.1)
Basophils Relative: 0 %
Eosinophils Absolute: 0.1 10*3/uL (ref 0.0–0.5)
Eosinophils Relative: 1 %
HCT: 37.9 % — ABNORMAL LOW (ref 39.0–52.0)
Hemoglobin: 12.9 g/dL — ABNORMAL LOW (ref 13.0–17.0)
Immature Granulocytes: 1 %
Lymphocytes Relative: 20 %
Lymphs Abs: 2.3 10*3/uL (ref 0.7–4.0)
MCH: 31.7 pg (ref 26.0–34.0)
MCHC: 34 g/dL (ref 30.0–36.0)
MCV: 93.1 fL (ref 80.0–100.0)
Monocytes Absolute: 1.1 10*3/uL — ABNORMAL HIGH (ref 0.1–1.0)
Monocytes Relative: 9 %
Neutro Abs: 7.8 10*3/uL — ABNORMAL HIGH (ref 1.7–7.7)
Neutrophils Relative %: 69 %
Platelets: 171 10*3/uL (ref 150–400)
RBC: 4.07 MIL/uL — ABNORMAL LOW (ref 4.22–5.81)
RDW: 12.4 % (ref 11.5–15.5)
WBC: 11.4 10*3/uL — ABNORMAL HIGH (ref 4.0–10.5)
nRBC: 0 % (ref 0.0–0.2)

## 2022-10-13 LAB — COMPREHENSIVE METABOLIC PANEL
ALT: 21 U/L (ref 0–53)
ALT: 23 U/L (ref 0–44)
AST: 13 U/L (ref 0–37)
AST: 15 U/L (ref 15–41)
Albumin: 4.1 g/dL (ref 3.5–5.2)
Albumin: 3.8 g/dL (ref 3.5–5.0)
Alkaline Phosphatase: 32 U/L — ABNORMAL LOW (ref 39–117)
Alkaline Phosphatase: 33 U/L — ABNORMAL LOW (ref 38–126)
Anion gap: 11 (ref 5–15)
BUN: 40 mg/dL — ABNORMAL HIGH (ref 6–23)
BUN: 42 mg/dL — ABNORMAL HIGH (ref 8–23)
CO2: 23 meq/L (ref 19–32)
CO2: 19 mmol/L — ABNORMAL LOW (ref 22–32)
Calcium: 9.6 mg/dL (ref 8.4–10.5)
Calcium: 8.8 mg/dL — ABNORMAL LOW (ref 8.9–10.3)
Chloride: 101 meq/L (ref 96–112)
Chloride: 102 mmol/L (ref 98–111)
Creatinine, Ser: 1.87 mg/dL — ABNORMAL HIGH (ref 0.40–1.50)
Creatinine, Ser: 1.82 mg/dL — ABNORMAL HIGH (ref 0.61–1.24)
GFR, Estimated: 38 mL/min — ABNORMAL LOW (ref >60)
GFR: 34.37 mL/min — ABNORMAL LOW (ref >60.00)
Glucose, Bld: 126 mg/dL — ABNORMAL HIGH (ref 70–99)
Glucose, Bld: 148 mg/dL — ABNORMAL HIGH (ref 70–99)
Potassium: 5.7 meq/L — ABNORMAL HIGH (ref 3.5–5.1)
Potassium: 4.4 mmol/L (ref 3.5–5.1)
Sodium: 132 meq/L — ABNORMAL LOW (ref 135–145)
Sodium: 132 mmol/L — ABNORMAL LOW (ref 135–145)
Total Bilirubin: 0.6 mg/dL (ref 0.2–1.2)
Total Bilirubin: 0.3 mg/dL (ref 0.3–1.2)
Total Protein: 6.9 g/dL (ref 6.0–8.3)
Total Protein: 6.9 g/dL (ref 6.5–8.1)

## 2022-10-13 LAB — HEMOGLOBIN A1C: Hgb A1c MFr Bld: 7.3 % — ABNORMAL HIGH (ref 4.6–6.5)

## 2022-10-13 MED ORDER — SODIUM CHLORIDE 0.9 % IV BOLUS
1000.0000 mL | Freq: Once | INTRAVENOUS | Status: AC
  Administered 2022-10-13: 1000 mL via INTRAVENOUS

## 2022-10-13 NOTE — ED Provider Notes (Signed)
[facility] Provider Note  CSN: [id number] Arrival date & time: 10/13/22 1815  Chief Complaint(s) Abnormal Lab  HPI Douglas Richards is a 77 y.o. male history of CKD diabetes, hypertension, hyperlipidemia presented to the emergency department with abnormal lab.  Reports that he was called today that his potassium was 5.7.  Reports he feels well overall, doing his normal ADLs, was working in the yard today.  Reports normal urination.  No fevers or chills, lightheadedness or dizziness, chest pain, shortness of breath, syncope, leg swelling.   Past Medical History Past Medical History:  Diagnosis Date   Allergic rhinitis    Allergy    Anal fissure    Cataract    right eye    Chronic kidney disease    Stage 3   Diabetes mellitus 02/06/2009   type II   Diverticulosis of colon (without mention of hemorrhage)    GERD (gastroesophageal reflux disease)    Gout, unspecified    Herpes zoster without mention of complication    Hyperlipidemia    Hypertension    Osteoarthrosis, unspecified whether generalized or localized, unspecified site    Other abnormal glucose    Personal history of colonic polyps    Patient Active Problem List   Diagnosis Date Noted   Arm contusion, left, sequela 07/13/2022   Gastritis 07/05/2022   GERD (gastroesophageal reflux disease) 07/05/2022   Actinic keratosis 09/07/2021   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   Low back pain 06/17/2019   Chest wall contusion 01/10/2018   CRI (chronic renal insufficiency), stage 3 (moderate) 09/10/2017   Right shoulder pain 05/12/2016   Cough 05/04/2015   Abdominal pain 11/04/2014   Abdominal bloating 11/04/2014   Grief reaction 09/17/2013   Neck pain, bilateral 04/24/2013   Rash and nonspecific skin eruption 10/03/2012   Well adult exam 05/21/2012   BPH (benign prostatic hyperplasia) 05/21/2012   Stress 02/21/2012   Bronchitis, acute  01/13/2011   DM2 (diabetes mellitus, type 2) (HCC) 07/23/2009   SINUSITIS- ACUTE-NOS 03/30/2009   OLECRANON BURSITIS, LEFT 11/27/2008   TENDINITIS 04/01/2008   ELBOW PAIN, LEFT 01/09/2008   PAIN IN JOINT, ANKLE AND FOOT 01/09/2008   DIVERTICULOSIS, COLON 07/12/2007   ANAL FISSURE, HX OF 07/12/2007   TONSILLECTOMY, HX OF 07/12/2007   HEMORRHOIDECTOMY, HX OF 07/12/2007   Osteoarthritis 03/30/2007   Dyslipidemia 03/29/2007   Gout 03/29/2007   Essential hypertension 03/29/2007   ALLERGIC RHINITIS 03/29/2007   ABNORMAL GLUCOSE NEC 03/29/2007   COLONIC POLYPS, HX OF 03/29/2007   Home Medication(s) Prior to Admission medications   Medication Sig Start Date End Date Taking? Authorizing Provider  allopurinol (ZYLOPRIM) 100 MG tablet Take 1/2 tablet (50 mg total) by mouth daily. 10/11/22 10/11/23  Plotnikov, Georgina Quint, MD  amLODipine (NORVASC) 5 MG tablet Take 1 tablet (5 mg total) by mouth daily. 07/07/22   Plotnikov, Georgina Quint, MD  ASPIRIN 81 PO Take by mouth.    [provider]  Cholecalciferol (VITAMIN D3) 1000 UNITS tablet Take 1,000 Units by mouth daily.    [provider]  Glucosamine-Chondroitin 750-600 MG TABS Take by mouth 2 (two) times daily. Reported on 04/28/2015    [provider]  glucose blood (FREESTYLE LITE) test strip USE TO CHECK BLOOD SUGAR TWICE DAILY 11/11/21   Plotnikov, Georgina Quint, MD  HYDROcodone-acetaminophen (NORCO/VICODIN) 5-325 MG tablet Take 1 tablet by mouth every 6 (six) hours as needed for severe pain. 08/16/22 02/12/23  Plotnikov, Georgina Quint, MD  Lancets (FREESTYLE) lancets USE TO CHECK BLOOD SUGAR TWICE DAILY. Annual appt is due w/lab must see provider for future refills 05/26/20   Plotnikov, Georgina Quint, MD  loratadine (CLARITIN) 10 MG tablet Take 10 mg by mouth daily.    [provider]  metFORMIN (GLUCOPHAGE) 500 MG tablet Take 1 tablet (500 mg total) by mouth 2 (two) times daily with a meal. 07/13/22   Plotnikov, Georgina Quint, MD   methocarbamol (ROBAXIN) 500 MG tablet Take 1 tablet (500 mg total) by mouth every 8 (eight) hours as needed. 10/11/22   Plotnikov, Georgina Quint, MD  MINERAL OIL PO Take by mouth.    [provider]  omeprazole (PRILOSEC) 20 MG capsule Take 1 capsule (20 mg total) by mouth daily. 02/27/22   Plotnikov, Georgina Quint, MD  pantoprazole (PROTONIX) 40 MG tablet Take 1 tablet (40 mg total) by mouth 2 (two) times daily. 07/13/22   Plotnikov, Georgina Quint, MD  Psyllium (METAMUCIL PO) Take by mouth.    [provider]  repaglinide (PRANDIN) 1 MG tablet Take 1 tablet (1 mg total) by mouth 3 (three) times daily before meals. 12/05/21   Plotnikov, Georgina Quint, MD  rosuvastatin (CRESTOR) 10 MG tablet Take 1 tablet (10 mg total) by mouth daily. 10/11/22   Plotnikov, Georgina Quint, MD  Saw Palmetto, Serenoa repens, (SAW PALMETTO PO) Take by mouth.    [provider]  tamsulosin (FLOMAX) 0.4 MG CAPS capsule Take 1 capsule (0.4 mg total) by mouth daily. 02/27/22   Plotnikov, Georgina Quint, MD  thiamine (VITAMIN B-1) 100 MG tablet Take 100 mg by mouth daily.    [provider]  trandolapril (MAVIK) 4 MG tablet Take 1 tablet (4 mg total) by mouth daily. 07/07/22   Plotnikov, Georgina Quint, MD                                                                                                                                    Past Surgical History Past Surgical History:  Procedure Laterality Date   CATARACT EXTRACTION Right    COLONOSCOPY  01/15/2014   HEMORRHOID SURGERY     LIPOMA EXCISION     from the neck   POLYPECTOMY     rotator cuff surgery Right    TONSILLECTOMY     Family History Family History  Problem Relation Age of Onset   COPD Mother    Stroke Mother    Hypertension Mother    Diabetes Father    COPD Father    Alcohol abuse Father    Stroke Father    Hypertension Father    Alcohol abuse Brother    Arthritis Brother        lupus   Heart disease Brother    Hypertension Other        fam  hx   Heart disease Sister    Colon cancer Neg Hx  Colon polyps Neg Hx    Esophageal cancer Neg Hx    Stomach cancer Neg Hx    Rectal cancer Neg Hx     Social History Social History   Tobacco Use   Smoking status: Never   Smokeless tobacco: Never  Vaping Use   Vaping status: Never Used  Substance Use Topics   Alcohol use: Not Currently    Alcohol/week: 0.0 standard drinks of alcohol   Drug use: No   Allergies Atorvastatin, Prednisolone, Sulfonamide derivatives, and Trulicity [dulaglutide]  Review of Systems Review of Systems  All other systems reviewed and are negative.   Physical Exam Vital Signs  I have reviewed the triage vital signs BP (!) 168/109 (BP Location: Left Arm)   Pulse 87   Temp 97.8 F (36.6 C) (Oral)   Resp 16   Ht 5\' 8"  (1.727 m)   Wt 83 kg   SpO2 100%   BMI 27.82 kg/m  Physical Exam Vitals and nursing note reviewed.  Constitutional:      General: He is not in acute distress.    Appearance: Normal appearance.  HENT:     Mouth/Throat:     Mouth: Mucous membranes are moist.  Eyes:     Conjunctiva/sclera: Conjunctivae normal.  Cardiovascular:     Rate and Rhythm: Normal rate and regular rhythm.  Pulmonary:     Effort: Pulmonary effort is normal. No respiratory distress.     Breath sounds: Normal breath sounds.  Abdominal:     General: Abdomen is flat.     Palpations: Abdomen is soft.     Tenderness: There is no abdominal tenderness.  Musculoskeletal:     Right lower leg: No edema.     Left lower leg: No edema.  Skin:    General: Skin is warm and dry.     Capillary Refill: Capillary refill takes less than 2 seconds.  Neurological:     Mental Status: He is alert and oriented to person, place, and time. Mental status is at baseline.  Psychiatric:        Mood and Affect: Mood normal.        Behavior: Behavior normal.     ED Results and Treatments Labs (all labs ordered are listed, but only abnormal results are displayed) Labs  Reviewed  CBC WITH DIFFERENTIAL/PLATELET - Abnormal; Notable for the following components:      Result Value   WBC 11.4 (*)    RBC 4.07 (*)    Hemoglobin 12.9 (*)    HCT 37.9 (*)    Neutro Abs 7.8 (*)    Monocytes Absolute 1.1 (*)    All other components within normal limits  COMPREHENSIVE METABOLIC PANEL - Abnormal; Notable for the following components:   Sodium 132 (*)    CO2 19 (*)    Glucose, Bld 148 (*)    BUN 42 (*)    Creatinine, Ser 1.82 (*)    Calcium 8.8 (*)    Alkaline Phosphatase 33 (*)    GFR, Estimated 38 (*)    All other components within normal limits  Radiology No results found.  Pertinent labs & imaging results that were available during my care of the patient were reviewed by me and considered in my medical decision making (see MDM for details).  Medications Ordered in ED Medications  sodium chloride 0.9 % bolus 1,000 mL (1,000 mLs Intravenous New Bag/Given 10/13/22 1911)                                                                                                                                     Procedures Procedures  (including critical care time)  Medical Decision Making / ED Course   MDM:  77 year old male presenting to the emergency department with abnormal lab.  Patient well-appearing, physical exam unremarkable.  Reviewed earlier labs which showed potassium of 5.7.  Will recheck.  He does report recent cortisone injection which could have exacerbated this potentially.  Renal function was also slightly worse than previous, will give IV fluids.  No EKG changes.  Patient is overall very well-appearing, I do not think he would need admission if repeat potassium stable, probably discussed with his nephrology team to see if they can follow-up with him early next week, give lokelma.   Clinical Course as of 10/13/22 1949  Caleen Essex Oct 13, 2022  1947 Lab test obtained today with normal potassium of 4.4.  Suspect earlier level could have been hemolysis.  Creatinine is stable from earlier today, slightly increased from previous.  Discussed results with the patient.  Will give the rest of fluids.  Advised follow-up with primary doctor for recheck of his creatinine.  Labs otherwise notable for mild nonspecific leukocytosis.  Patient not having any fevers or chills or any infectious symptoms. Will discharge patient to home. All questions answered. Patient comfortable with plan of discharge. Return precautions discussed with patient and specified on the after visit summary.  [WS]    Clinical Course User Index [WS] Lonell Grandchild, MD     Additional history obtained: -External records from outside source obtained and reviewed including: Chart review including previous notes, labs, imaging, consultation notes including labs from earlier today   Lab Tests: -I ordered, reviewed, and interpreted labs.   The pertinent results include:   Labs Reviewed  CBC WITH DIFFERENTIAL/PLATELET - Abnormal; Notable for the following components:      Result Value   WBC 11.4 (*)    RBC 4.07 (*)    Hemoglobin 12.9 (*)    HCT 37.9 (*)    Neutro Abs 7.8 (*)    Monocytes Absolute 1.1 (*)    All other components within normal limits  COMPREHENSIVE METABOLIC PANEL - Abnormal; Notable for the following components:   Sodium 132 (*)    CO2 19 (*)    Glucose, Bld 148 (*)    BUN 42 (*)    Creatinine, Ser 1.82 (*)    Calcium 8.8 (*)    Alkaline Phosphatase 33 (*)  GFR, Estimated 38 (*)    All other components within normal limits    Notable for mild hyponatremia, mild AKI. No hyperkalemia. Nonspecific leukocytosis   EKG   EKG Interpretation Date/Time:  Friday October 13 2022 18:26:01 EDT Ventricular Rate:  85 PR Interval:  137 QRS Duration:  84 QT Interval:  353 QTC Calculation: 420 R Axis:   17  Text Interpretation: Sinus rhythm  Abnormal R-wave progression, early transition Confirmed by Alvino Blood ([id number]) on 10/13/2022 7:49:16 PM         Medicines ordered and prescription drug management: Meds ordered this encounter  Medications   sodium chloride 0.9 % bolus 1,000 mL    -I have reviewed the patients home medicines and have made adjustments as needed   Co morbidities that complicate the patient evaluation  Past Medical History:  Diagnosis Date   Allergic rhinitis    Allergy    Anal fissure    Cataract    right eye    Chronic kidney disease    Stage 3   Diabetes mellitus 02/06/2009   type II   Diverticulosis of colon (without mention of hemorrhage)    GERD (gastroesophageal reflux disease)    Gout, unspecified    Herpes zoster without mention of complication    Hyperlipidemia    Hypertension    Osteoarthrosis, unspecified whether generalized or localized, unspecified site    Other abnormal glucose    Personal history of colonic polyps       Dispostion: Disposition decision including need for hospitalization was considered, and patient discharged from emergency department.    Final Clinical Impression(s) / ED Diagnoses Final diagnoses:  Feared complaint without diagnosis  AKI (acute kidney injury) (HCC)     This chart was dictated using voice recognition software.  Despite best efforts to proofread,  errors can occur which can change the documentation meaning.    Lonell Grandchild, MD 10/13/22 [phone]

## 2022-10-13 NOTE — ED Triage Notes (Signed)
The patient stated his potassium is 5.7 today after getting routine blood work. Hx of same

## 2022-10-13 NOTE — ED Notes (Signed)
Pt without complaints at this time. Call bell within reach, will continue to monitor.

## 2022-10-13 NOTE — Discharge Instructions (Addendum)
We evaluated you for your high potassium.  We repeated your blood tests and your potassium level was normal.  This is relatively common with this specific blood test.  We did notice that your kidney function was slightly worse than normal and gave you fluids in the emergency department.  Please follow-up with your primary doctor and your kidney doctor for recheck.  Please be sure to drink lots of fluids in the meantime.  If you do develop any new or worsening symptoms such as lightheadedness or dizziness, leg swelling, fainting, fevers or chills, or any other new symptoms, please return to the emergency department.

## 2022-10-17 ENCOUNTER — Ambulatory Visit (INDEPENDENT_AMBULATORY_CARE_PROVIDER_SITE_OTHER): Payer: PPO | Admitting: Internal Medicine

## 2022-10-17 ENCOUNTER — Encounter: Payer: Self-pay | Admitting: Internal Medicine

## 2022-10-17 ENCOUNTER — Other Ambulatory Visit (HOSPITAL_COMMUNITY): Payer: Self-pay

## 2022-10-17 VITALS — BP 120/78 | HR 76 | Temp 98.6°F | Ht 68.0 in | Wt 173.0 lb

## 2022-10-17 DIAGNOSIS — E1122 Type 2 diabetes mellitus with diabetic chronic kidney disease: Secondary | ICD-10-CM

## 2022-10-17 DIAGNOSIS — Z7984 Long term (current) use of oral hypoglycemic drugs: Secondary | ICD-10-CM | POA: Diagnosis not present

## 2022-10-17 DIAGNOSIS — N182 Chronic kidney disease, stage 2 (mild): Secondary | ICD-10-CM | POA: Diagnosis not present

## 2022-10-17 DIAGNOSIS — Z23 Encounter for immunization: Secondary | ICD-10-CM

## 2022-10-17 DIAGNOSIS — I10 Essential (primary) hypertension: Secondary | ICD-10-CM

## 2022-10-17 DIAGNOSIS — K4090 Unilateral inguinal hernia, without obstruction or gangrene, not specified as recurrent: Secondary | ICD-10-CM | POA: Insufficient documentation

## 2022-10-17 MED ORDER — ACCU-CHEK GUIDE VI STRP
ORAL_STRIP | Freq: Two times a day (BID) | 12 refills | Status: DC
  Filled 2022-10-17: qty 100, 50d supply, fill #0

## 2022-10-17 MED ORDER — ONETOUCH DELICA PLUS LANCET33G MISC
12 refills | Status: AC
  Filled 2022-10-17: qty 100, 50d supply, fill #0
  Filled 2023-02-25: qty 100, 50d supply, fill #1
  Filled 2023-08-07: qty 100, 50d supply, fill #2

## 2022-10-17 MED ORDER — HYDROCODONE-ACETAMINOPHEN 5-325 MG PO TABS
1.0000 | ORAL_TABLET | Freq: Four times a day (QID) | ORAL | 0 refills | Status: DC | PRN
  Filled 2022-10-17: qty 120, 30d supply, fill #0

## 2022-10-17 MED ORDER — ACCU-CHEK GUIDE W/DEVICE KIT
PACK | Freq: Two times a day (BID) | 1 refills | Status: AC
  Filled 2022-10-17: qty 1, 30d supply, fill #0

## 2022-10-17 NOTE — Assessment & Plan Note (Signed)
On Metformin - pt wants to be back on 500 bid On Prandin Glucometer Rx and supplies

## 2022-10-17 NOTE — Assessment & Plan Note (Signed)
Continue on amlodipine, [facility]

## 2022-10-17 NOTE — Assessment & Plan Note (Signed)
New R inguinal - large Gen Surg ref

## 2022-10-17 NOTE — Progress Notes (Signed)
Subjective:  Patient ID: Douglas Richards, male    DOB: 1945/09/16  Age: 77 y.o. MRN: [id number]  CC: Follow-up (3 mnth f/u, DISCUSS A NEW glucometer as well)   HPI Douglas Richards presents for hyperkalemia artifact f/u F/u CRI, DM C/o R LQ abd pain x 3 mo - worse w/standing, better w/laying down   Outpatient Medications Prior to Visit  Medication Sig Dispense Refill   allopurinol (ZYLOPRIM) 100 MG tablet Take 1/2 tablet (50 mg total) by mouth daily. 90 tablet 1   amLODipine (NORVASC) 5 MG tablet Take 1 tablet (5 mg total) by mouth daily. 90 tablet 1   ASPIRIN 81 PO Take by mouth.     Cholecalciferol (VITAMIN D3) 1000 UNITS tablet Take 1,000 Units by mouth daily.     Glucosamine-Chondroitin 750-600 MG TABS Take by mouth 2 (two) times daily. Reported on 04/28/2015     glucose blood (FREESTYLE LITE) test strip USE TO CHECK BLOOD SUGAR TWICE DAILY 100 strip 5   Lancets (FREESTYLE) lancets USE TO CHECK BLOOD SUGAR TWICE DAILY. Annual appt is due w/lab must see provider for future refills 100 each 5   loratadine (CLARITIN) 10 MG tablet Take 10 mg by mouth daily.     metFORMIN (GLUCOPHAGE) 500 MG tablet Take 1 tablet (500 mg total) by mouth 2 (two) times daily with a meal. 180 tablet 3   methocarbamol (ROBAXIN) 500 MG tablet Take 1 tablet (500 mg total) by mouth every 8 (eight) hours as needed. 30 tablet 0   MINERAL OIL PO Take by mouth.     omeprazole (PRILOSEC) 20 MG capsule Take 1 capsule (20 mg total) by mouth daily. 90 capsule 3   pantoprazole (PROTONIX) 40 MG tablet Take 1 tablet (40 mg total) by mouth 2 (two) times daily. 60 tablet 5   Psyllium (METAMUCIL PO) Take by mouth.     repaglinide (PRANDIN) 1 MG tablet Take 1 tablet (1 mg total) by mouth 3 (three) times daily before meals. 270 tablet 3   rosuvastatin (CRESTOR) 10 MG tablet Take 1 tablet (10 mg total) by mouth daily. 90 tablet 3   Saw Palmetto, Serenoa repens, (SAW PALMETTO PO) Take by mouth.     tamsulosin (FLOMAX) 0.4 MG CAPS  capsule Take 1 capsule (0.4 mg total) by mouth daily. 90 capsule 3   thiamine (VITAMIN B-1) 100 MG tablet Take 100 mg by mouth daily.     trandolapril (MAVIK) 4 MG tablet Take 1 tablet (4 mg total) by mouth daily. 90 tablet 1   HYDROcodone-acetaminophen (NORCO/VICODIN) 5-325 MG tablet Take 1 tablet by mouth every 6 (six) hours as needed for severe pain. 120 tablet 0   No facility-administered medications prior to visit.    ROS: Review of Systems  Gastrointestinal:  Positive for abdominal distention and abdominal pain. Negative for nausea.  Musculoskeletal:  Positive for back pain.    Objective:  BP 120/78 (BP Location: Left Arm, Patient Position: Sitting, Cuff Size: Large)   Pulse 76   Temp 98.6 F (37 C) (Oral)   Ht 5\' 8"  (1.727 m)   Wt 173 lb (78.5 kg)   SpO2 96%   BMI 26.30 kg/m   BP Readings from Last 3 Encounters:  10/17/22 120/78  10/13/22 135/75  07/13/22 120/70    Wt Readings from Last 3 Encounters:  10/17/22 173 lb (78.5 kg)  10/13/22 182 lb 15.7 oz (83 kg)  07/13/22 184 lb (83.5 kg)    Physical Exam Constitutional:  General: He is not in acute distress.    Appearance: He is well-developed. He is obese.     Comments: NAD  Eyes:     Conjunctiva/sclera: Conjunctivae normal.     Pupils: Pupils are equal, round, and reactive to light.  Neck:     Thyroid: No thyromegaly.     Vascular: No JVD.  Cardiovascular:     Rate and Rhythm: Normal rate and regular rhythm.     Heart sounds: Normal heart sounds. No murmur heard.    No friction rub. No gallop.  Pulmonary:     Effort: Pulmonary effort is normal. No respiratory distress.     Breath sounds: Normal breath sounds. No wheezing or rales.  Chest:     Chest wall: No tenderness.  Abdominal:     General: Bowel sounds are normal. There is no distension.     Palpations: Abdomen is soft. There is no mass.     Tenderness: There is no abdominal tenderness. There is no guarding or rebound.  Musculoskeletal:         General: No tenderness. Normal range of motion.     Cervical back: Normal range of motion.  Lymphadenopathy:     Cervical: No cervical adenopathy.  Skin:    General: Skin is warm and dry.     Findings: No rash.  Neurological:     Mental Status: He is alert and oriented to person, place, and time.     Cranial Nerves: No cranial nerve deficit.     Motor: No abnormal muscle tone.     Coordination: Coordination normal.     Gait: Gait normal.     Deep Tendon Reflexes: Reflexes are normal and symmetric.  Psychiatric:        Behavior: Behavior normal.        Thought Content: Thought content normal.        Judgment: Judgment normal.   Large R inguinal hernia, reduced when supine  Lab Results  Component Value Date   WBC 11.4 (H) 10/13/2022   HGB 12.9 (L) 10/13/2022   HCT 37.9 (L) 10/13/2022   PLT 171 10/13/2022   GLUCOSE 148 (H) 10/13/2022   CHOL 111 07/05/2022   TRIG 118.0 07/05/2022   HDL 49.70 07/05/2022   LDLDIRECT 105.5 09/10/2013   LDLCALC 38 07/05/2022   ALT 23 10/13/2022   AST 15 10/13/2022   NA 132 (L) 10/13/2022   K 4.4 10/13/2022   CL 102 10/13/2022   CREATININE 1.82 (H) 10/13/2022   BUN 42 (H) 10/13/2022   CO2 19 (L) 10/13/2022   TSH 1.45 07/05/2022   PSA 0.81 09/17/2015   HGBA1C 7.3 (H) 10/13/2022   MICROALBUR 0.4 03/31/2010    No results found.  Assessment & Plan:   Problem List Items Addressed This Visit     DM2 (diabetes mellitus, type 2) (HCC)    On Metformin - pt wants to be back on 500 bid On Prandin Glucometer Rx and supplies      Relevant Orders   Hemoglobin A1c   Comprehensive metabolic panel   Essential hypertension    Continue on amlodipine, Mavik Pain control      Relevant Orders   Comprehensive metabolic panel   Inguinal hernia - Primary    New R inguinal - large Gen Surg ref      Relevant Orders   Ambulatory referral to General Surgery   Other Visit Diagnoses     Need for influenza vaccination  Relevant Orders    Flu Vaccine Trivalent High Dose (Fluad) (Completed)         Meds ordered this encounter  Medications   HYDROcodone-acetaminophen (NORCO/VICODIN) 5-325 MG tablet    Sig: Take 1 tablet by mouth every 6 (six) hours as needed for severe pain.    Dispense:  120 tablet    Refill:  0   Accu-Chek Softclix Lancets lancets    Sig: Use as instructed    Dispense:  100 each    Refill:  12   Blood Glucose Monitoring Suppl (ACCU-CHEK GUIDE) w/Device KIT    Sig: Use bid    Dispense:  1 kit    Refill:  1   glucose blood (ACCU-CHEK GUIDE) test strip    Sig: Use as instructed bid    Dispense:  100 each    Refill:  12      Follow-up: Return in about 3 months (around 01/16/2023) for a follow-up visit.  Sonda Primes, MD

## 2022-10-18 ENCOUNTER — Other Ambulatory Visit (HOSPITAL_COMMUNITY): Payer: Self-pay

## 2022-10-18 ENCOUNTER — Other Ambulatory Visit: Payer: Self-pay

## 2022-10-24 ENCOUNTER — Other Ambulatory Visit (HOSPITAL_COMMUNITY): Payer: Self-pay

## 2022-10-24 ENCOUNTER — Ambulatory Visit (INDEPENDENT_AMBULATORY_CARE_PROVIDER_SITE_OTHER): Payer: PPO | Admitting: Internal Medicine

## 2022-10-24 ENCOUNTER — Encounter: Payer: Self-pay | Admitting: Internal Medicine

## 2022-10-24 VITALS — BP 134/76 | HR 80 | Temp 98.1°F | Ht 68.0 in | Wt 172.0 lb

## 2022-10-24 DIAGNOSIS — K4090 Unilateral inguinal hernia, without obstruction or gangrene, not specified as recurrent: Secondary | ICD-10-CM | POA: Diagnosis not present

## 2022-10-24 DIAGNOSIS — R1031 Right lower quadrant pain: Secondary | ICD-10-CM | POA: Diagnosis not present

## 2022-10-24 DIAGNOSIS — R7309 Other abnormal glucose: Secondary | ICD-10-CM

## 2022-10-24 DIAGNOSIS — I10 Essential (primary) hypertension: Secondary | ICD-10-CM

## 2022-10-24 MED ORDER — TRAMADOL HCL 50 MG PO TABS
50.0000 mg | ORAL_TABLET | Freq: Four times a day (QID) | ORAL | 0 refills | Status: DC | PRN
  Filled 2022-10-24: qty 30, 8d supply, fill #0

## 2022-10-24 NOTE — Progress Notes (Signed)
Patient ID: Douglas Richards, male   DOB: 26-Jun-1945, 77 y.o.   MRN: [id number]        Chief Complaint: follow up right inguinal hernia, constipation,        HPI:  Douglas Richards is a 77 y.o. male here with c/o persistent right inguinal hernia pain with swelling and tender, severe to start 4 days ago, mild now, Denies worsening reflux, abd pain, dysphagia, n/v, or blood, but has had worsening constipation it seems as wel.  Has office appt with general surgury in 10 days, but here due to persistent pain.  Adamant will not go to Ed.   Pt denies polydipsia, polyuria, or new focal neuro s/s.   Pt denies chest pain, increased sob or doe, wheezing, orthopnea, PND, increased LE swelling, palpitations, dizziness or syncope.   Pt denies fever, wt loss, night sweats, loss of appetite, or other constitutional symptoms  Does not appear toxic today       Wt Readings from Last 3 Encounters:  10/24/22 172 lb (78 kg)  10/17/22 173 lb (78.5 kg)  10/13/22 182 lb 15.7 oz (83 kg)   BP Readings from Last 3 Encounters:  10/24/22 134/76  10/17/22 120/78  10/13/22 135/75         Past Medical History:  Diagnosis Date   Allergic rhinitis    Allergy    Anal fissure    Cataract    right eye    Chronic kidney disease    Stage 3   Diabetes mellitus 02/06/2009   type II   Diverticulosis of colon (without mention of hemorrhage)    GERD (gastroesophageal reflux disease)    Gout, unspecified    Herpes zoster without mention of complication    Hyperlipidemia    Hypertension    Osteoarthrosis, unspecified whether generalized or localized, unspecified site    Other abnormal glucose    Personal history of colonic polyps    Past Surgical History:  Procedure Laterality Date   CATARACT EXTRACTION Right    COLONOSCOPY  01/15/2014   HEMORRHOID SURGERY     LIPOMA EXCISION     from the neck   POLYPECTOMY     rotator cuff surgery Right    TONSILLECTOMY      reports that he has never smoked. He has never used  smokeless tobacco. He reports that he does not currently use alcohol. He reports that he does not use drugs. family history includes Alcohol abuse in his brother and father; Arthritis in his brother; COPD in his father and mother; Diabetes in his father; Heart disease in his brother and sister; Hypertension in his father, mother, and another family member; Stroke in his father and mother. Allergies  Allergen Reactions   Atorvastatin     REACTION: aches   Prednisolone Swelling    Red swollen face He had cortisone knee shot and was ok   Sulfonamide Derivatives     REACTION: nausea   Trulicity [Dulaglutide]     ?pancreatitis   Current Outpatient Medications on File Prior to Visit  Medication Sig Dispense Refill   allopurinol (ZYLOPRIM) 100 MG tablet Take 1/2 tablet (50 mg total) by mouth daily. 90 tablet 1   amLODipine (NORVASC) 5 MG tablet Take 1 tablet (5 mg total) by mouth daily. 90 tablet 1   ASPIRIN 81 PO Take by mouth.     Blood Glucose Monitoring Suppl (ACCU-CHEK GUIDE) w/Device KIT Use to check blood sugar twice daily. 1 kit 1   Cholecalciferol (  VITAMIN D3) 1000 UNITS tablet Take 1,000 Units by mouth daily.     Glucosamine-Chondroitin 750-600 MG TABS Take by mouth 2 (two) times daily. Reported on 04/28/2015     glucose blood (ACCU-CHEK GUIDE) test strip Use as instructed twice daily. 100 each 12   glucose blood (FREESTYLE LITE) test strip USE TO CHECK BLOOD SUGAR TWICE DAILY 100 strip 5   HYDROcodone-acetaminophen (NORCO/VICODIN) 5-325 MG tablet Take 1 tablet by mouth every 6 (six) hours as needed for severe pain. 120 tablet 0   Lancets (FREESTYLE) lancets USE TO CHECK BLOOD SUGAR TWICE DAILY. Annual appt is due w/lab must see provider for future refills 100 each 5   Lancets (ONETOUCH DELICA PLUS LANCET33G) MISC Use as directed to check blood sugar twice daily. 100 each 12   loratadine (CLARITIN) 10 MG tablet Take 10 mg by mouth daily.     metFORMIN (GLUCOPHAGE) 500 MG tablet Take 1  tablet (500 mg total) by mouth 2 (two) times daily with a meal. 180 tablet 3   methocarbamol (ROBAXIN) 500 MG tablet Take 1 tablet (500 mg total) by mouth every 8 (eight) hours as needed. 30 tablet 0   MINERAL OIL PO Take by mouth.     omeprazole (PRILOSEC) 20 MG capsule Take 1 capsule (20 mg total) by mouth daily. 90 capsule 3   pantoprazole (PROTONIX) 40 MG tablet Take 1 tablet (40 mg total) by mouth 2 (two) times daily. 60 tablet 5   Psyllium (METAMUCIL PO) Take by mouth.     repaglinide (PRANDIN) 1 MG tablet Take 1 tablet (1 mg total) by mouth 3 (three) times daily before meals. 270 tablet 3   rosuvastatin (CRESTOR) 10 MG tablet Take 1 tablet (10 mg total) by mouth daily. 90 tablet 3   Saw Palmetto, Serenoa repens, (SAW PALMETTO PO) Take by mouth.     tamsulosin (FLOMAX) 0.4 MG CAPS capsule Take 1 capsule (0.4 mg total) by mouth daily. 90 capsule 3   thiamine (VITAMIN B-1) 100 MG tablet Take 100 mg by mouth daily.     trandolapril (MAVIK) 4 MG tablet Take 1 tablet (4 mg total) by mouth daily. 90 tablet 1   No current facility-administered medications on file prior to visit.        ROS:  All others reviewed and negative.  Objective        PE:  BP 134/76 (BP Location: Right Arm, Patient Position: Sitting, Cuff Size: Normal)   Pulse 80   Temp 98.1 F (36.7 C) (Oral)   Ht 5\' 8"  (1.727 m)   Wt 172 lb (78 kg)   SpO2 99%   BMI 26.15 kg/m                 Constitutional: Pt appears in NAD               HENT: Head: NCAT.                Right Ear: External ear normal.                 Left Ear: External ear normal.                Eyes: . Pupils are equal, round, and reactive to light. Conjunctivae and EOM are normal               Nose: without d/c or deformity               Neck: Neck  supple. Gross normal ROM               Cardiovascular: Normal rate and regular rhythm.                 Pulmonary/Chest: Effort normal and breath sounds without rales or wheezing.                Abd:  Soft,  NT, ND, + BS, no organomegaly, right inguinal area with mild tender reducible swelling               Neurological: Pt is alert. At baseline orientation, motor grossly intact               Skin: Skin is warm. No rashes, no other new lesions, LE edema - none               Psychiatric: Pt behavior is normal without agitation   Micro: none  Cardiac tracings I have personally interpreted today:  none  Pertinent Radiological findings (summarize): none   Lab Results  Component Value Date   WBC 11.4 (H) 10/13/2022   HGB 12.9 (L) 10/13/2022   HCT 37.9 (L) 10/13/2022   PLT 171 10/13/2022   GLUCOSE 148 (H) 10/13/2022   CHOL 111 07/05/2022   TRIG 118.0 07/05/2022   HDL 49.70 07/05/2022   LDLDIRECT 105.5 09/10/2013   LDLCALC 38 07/05/2022   ALT 23 10/13/2022   AST 15 10/13/2022   NA 132 (L) 10/13/2022   K 4.4 10/13/2022   CL 102 10/13/2022   CREATININE 1.82 (H) 10/13/2022   BUN 42 (H) 10/13/2022   CO2 19 (L) 10/13/2022   TSH 1.45 07/05/2022   PSA 0.81 09/17/2015   HGBA1C 7.3 (H) 10/13/2022   MICROALBUR 0.4 03/31/2010   Assessment/Plan:  STANLEY MERLE is a 77 y.o. White or Caucasian [1] male with  has a past medical history of Allergic rhinitis, Allergy, Anal fissure, Cataract, Chronic kidney disease, Diabetes mellitus (02/06/2009), Diverticulosis of colon (without mention of hemorrhage), GERD (gastroesophageal reflux disease), Gout, unspecified, Herpes zoster without mention of complication, Hyperlipidemia, Hypertension, Osteoarthrosis, unspecified whether generalized or localized, unspecified site, Other abnormal glucose, and Personal history of colonic polyps.  Abdominal pain With worsening rlq and inguinal area - pain improved today but I can't r/o early incarceration, pt adamant will not go to ED, next general surgury appt in aprox 10 days, for CT abd pelvis stat, and labs as ordered incluing cbc, ua  ABNORMAL GLUCOSE NEC Lab Results  Component Value Date   HGBA1C 7.3 (H)  10/13/2022   Mild uncontrolled, pt to continue current medical treatment metfomrin 500 bid, prandin 1 mgt tid  Essential hypertension BP Readings from Last 3 Encounters:  10/24/22 134/76  10/17/22 120/78  10/13/22 135/75   Stable, pt to continue medical treatment norvasc 5 every day, mavik 4 qd   Inguinal hernia For tramadol prn for now, will need general surgury sooner if abnormal ct  Followup: Return if symptoms worsen or fail to improve.  Oliver Barre, MD 10/24/2022 8:14 PM [facility]

## 2022-10-24 NOTE — Assessment & Plan Note (Signed)
Lab Results  Component Value Date   HGBA1C 7.3 (H) 10/13/2022   Mild uncontrolled, pt to continue current medical treatment metfomrin 500 bid, prandin 1 mgt tid

## 2022-10-24 NOTE — Assessment & Plan Note (Signed)
For tramadol prn for now, will need general surgury sooner if abnormal ct

## 2022-10-24 NOTE — Assessment & Plan Note (Signed)
With worsening rlq and inguinal area - pain improved today but I can't r/o early incarceration, pt adamant will not go to ED, next general surgury appt in aprox 10 days, for CT abd pelvis stat, and labs as ordered incluing cbc, ua

## 2022-10-24 NOTE — Assessment & Plan Note (Signed)
BP Readings from Last 3 Encounters:  10/24/22 134/76  10/17/22 120/78  10/13/22 135/75   Stable, pt to continue medical treatment norvasc 5 every day, mavik 4 qd

## 2022-10-24 NOTE — Patient Instructions (Signed)
Please take all new medication as prescribed - the tramadol for pain  Please continue all other medications as before, and refills have been done if requested.  Please have the pharmacy call with any other refills you may need.  Please keep your appointments with your specialists as you may have planned  You will be contacted regarding the referral for: CT scan to check for "incarceration"  Please go to the LAB at the blood drawing area for the tests to be done  You will be contacted by phone if any changes need to be made immediately.  Otherwise, you will receive a letter about your results with an explanation, but please check with MyChart first.

## 2022-10-25 ENCOUNTER — Other Ambulatory Visit: Payer: Self-pay

## 2022-10-25 ENCOUNTER — Ambulatory Visit
Admission: RE | Admit: 2022-10-25 | Discharge: 2022-10-25 | Disposition: A | Discharge status: Home / Self Care | Payer: PPO | Source: Ambulatory Visit | Attending: Internal Medicine | Admitting: Internal Medicine

## 2022-10-25 DIAGNOSIS — R1031 Right lower quadrant pain: Secondary | ICD-10-CM | POA: Diagnosis not present

## 2022-10-25 DIAGNOSIS — K4090 Unilateral inguinal hernia, without obstruction or gangrene, not specified as recurrent: Secondary | ICD-10-CM | POA: Diagnosis not present

## 2022-10-25 DIAGNOSIS — K5900 Constipation, unspecified: Secondary | ICD-10-CM | POA: Diagnosis not present

## 2022-10-25 DIAGNOSIS — K5730 Diverticulosis of large intestine without perforation or abscess without bleeding: Secondary | ICD-10-CM | POA: Diagnosis not present

## 2022-10-26 ENCOUNTER — Encounter (HOSPITAL_BASED_OUTPATIENT_CLINIC_OR_DEPARTMENT_OTHER): Payer: Self-pay | Admitting: Emergency Medicine

## 2022-10-26 ENCOUNTER — Emergency Department (HOSPITAL_BASED_OUTPATIENT_CLINIC_OR_DEPARTMENT_OTHER)
Admission: EM | Admit: 2022-10-26 | Discharge: 2022-10-26 | Disposition: A | Discharge status: Home / Self Care | Payer: PPO | Attending: Emergency Medicine | Admitting: Emergency Medicine

## 2022-10-26 ENCOUNTER — Other Ambulatory Visit: Payer: Self-pay

## 2022-10-26 DIAGNOSIS — I10 Essential (primary) hypertension: Secondary | ICD-10-CM | POA: Insufficient documentation

## 2022-10-26 DIAGNOSIS — K4090 Unilateral inguinal hernia, without obstruction or gangrene, not specified as recurrent: Secondary | ICD-10-CM | POA: Insufficient documentation

## 2022-10-26 DIAGNOSIS — Z794 Long term (current) use of insulin: Secondary | ICD-10-CM | POA: Diagnosis not present

## 2022-10-26 DIAGNOSIS — Z7984 Long term (current) use of oral hypoglycemic drugs: Secondary | ICD-10-CM | POA: Diagnosis not present

## 2022-10-26 DIAGNOSIS — Z7982 Long term (current) use of aspirin: Secondary | ICD-10-CM | POA: Insufficient documentation

## 2022-10-26 DIAGNOSIS — E119 Type 2 diabetes mellitus without complications: Secondary | ICD-10-CM | POA: Diagnosis not present

## 2022-10-26 DIAGNOSIS — Z79899 Other long term (current) drug therapy: Secondary | ICD-10-CM | POA: Diagnosis not present

## 2022-10-26 DIAGNOSIS — R1031 Right lower quadrant pain: Secondary | ICD-10-CM | POA: Diagnosis present

## 2022-10-26 NOTE — Discharge Instructions (Addendum)
You came to the emergency department for evaluation of a hernia.  The hernia does not need emergent repair at this moment.  Please follow-up with the general surgeon for possible surgery repair and please follow-up with PCP within the next 1 to 2 weeks.  You may call the general surgery office tomorrow to see if they can see you sooner.

## 2022-10-26 NOTE — ED Provider Notes (Signed)
[place] [facility] Provider Note   CSN: [id number] Arrival date & time: 10/26/22  1659     History  Chief Complaint  Patient presents with   Hernia    Douglas Richards is a 77 y.o. male with a past medical history of type 2 diabetes mellitus and essential hypertension who presents to the emergency department for evaluation of a right inguinal hernia.  Patient stated that he was having an increase in pain over the past weekend and he saw his PCP 2 days ago who ordered a CT scan with oral contrast.  The CT scan with oral contrast showed a fat-containing right inguinal hernia with mesenteric fat stranding and trace fluid that possibly could be reflecting a incarcerated hernia without bowel herniation.  Patient stated that his PCP asked him to go to the emergency department for further evaluation for possible incarceration.  Patient reported that his pain has resolved to a 1 to 2/10.  He reported feeling previously nauseated but is no longer nauseated.  Denies fever and chills, diarrhea, or constipation.       Home Medications Prior to Admission medications   Medication Sig Start Date End Date Taking? Authorizing Provider  allopurinol (ZYLOPRIM) 100 MG tablet Take 1/2 tablet (50 mg total) by mouth daily. 10/11/22 10/11/23  Plotnikov, Georgina Quint, MD  amLODipine (NORVASC) 5 MG tablet Take 1 tablet (5 mg total) by mouth daily. 07/07/22   Plotnikov, Georgina Quint, MD  ASPIRIN 81 PO Take by mouth.    [provider]  Blood Glucose Monitoring Suppl (ACCU-CHEK GUIDE) w/Device KIT Use to check blood sugar twice daily. 10/17/22   Plotnikov, Georgina Quint, MD  Cholecalciferol (VITAMIN D3) 1000 UNITS tablet Take 1,000 Units by mouth daily.    [provider]  Glucosamine-Chondroitin 750-600 MG TABS Take by mouth 2 (two) times daily. Reported on 04/28/2015    [provider]  glucose blood (ACCU-CHEK GUIDE) test strip Use as instructed twice daily.  10/17/22   Plotnikov, Georgina Quint, MD  glucose blood (FREESTYLE LITE) test strip USE TO CHECK BLOOD SUGAR TWICE DAILY 11/11/21   Plotnikov, Georgina Quint, MD  HYDROcodone-acetaminophen (NORCO/VICODIN) 5-325 MG tablet Take 1 tablet by mouth every 6 (six) hours as needed for severe pain. 10/17/22 04/15/23  Plotnikov, Georgina Quint, MD  Lancets (FREESTYLE) lancets USE TO CHECK BLOOD SUGAR TWICE DAILY. Annual appt is due w/lab must see provider for future refills 05/26/20   Plotnikov, Georgina Quint, MD  Lancets [facility] DELICA PLUS [place]) MISC Use as directed to check blood sugar twice daily. 10/17/22   Plotnikov, Georgina Quint, MD  loratadine (CLARITIN) 10 MG tablet Take 10 mg by mouth daily.    [provider]  metFORMIN (GLUCOPHAGE) 500 MG tablet Take 1 tablet (500 mg total) by mouth 2 (two) times daily with a meal. 07/13/22   Plotnikov, Georgina Quint, MD  methocarbamol (ROBAXIN) 500 MG tablet Take 1 tablet (500 mg total) by mouth every 8 (eight) hours as needed. 10/11/22   Plotnikov, Georgina Quint, MD  MINERAL OIL PO Take by mouth.    [provider]  omeprazole (PRILOSEC) 20 MG capsule Take 1 capsule (20 mg total) by mouth daily. 02/27/22   Plotnikov, Georgina Quint, MD  pantoprazole (PROTONIX) 40 MG tablet Take 1 tablet (40 mg total) by mouth 2 (two) times daily. 07/13/22   Plotnikov, Georgina Quint, MD  Psyllium (METAMUCIL PO) Take by mouth.    [provider]  repaglinide (PRANDIN) 1  MG tablet Take 1 tablet (1 mg total) by mouth 3 (three) times daily before meals. 12/05/21   Plotnikov, Georgina Quint, MD  rosuvastatin (CRESTOR) 10 MG tablet Take 1 tablet (10 mg total) by mouth daily. 10/11/22   Plotnikov, Georgina Quint, MD  Saw Palmetto, Serenoa repens, (SAW PALMETTO PO) Take by mouth.    [provider]  tamsulosin (FLOMAX) 0.4 MG CAPS capsule Take 1 capsule (0.4 mg total) by mouth daily. 02/27/22   Plotnikov, Georgina Quint, MD  thiamine (VITAMIN B-1) 100 MG tablet Take 100 mg by mouth daily.    [provider]   traMADol (ULTRAM) 50 MG tablet Take 1 tablet (50 mg total) by mouth every 6 (six) hours as needed. 10/24/22   Corwin Levins, MD  trandolapril (MAVIK) 4 MG tablet Take 1 tablet (4 mg total) by mouth daily. 07/07/22   Plotnikov, Georgina Quint, MD      Allergies    Atorvastatin, Prednisolone, Sulfonamide derivatives, and Trulicity [dulaglutide]    Review of Systems   Review of Systems  Constitutional:  Negative for chills and fever.  Gastrointestinal:  Positive for abdominal pain and nausea. Negative for blood in stool, constipation, diarrhea and vomiting.  Genitourinary:  Negative for dysuria.    Physical Exam Updated Vital Signs BP 121/62   Pulse 78   Temp 97.8 F (36.6 C) (Oral)   Resp 18   Ht 5\' 7"  (1.702 m)   Wt 78 kg   SpO2 99%   BMI 26.94 kg/m  Physical Exam Vitals reviewed.  Constitutional:      General: He is not in acute distress.    Appearance: Normal appearance. He is not ill-appearing, toxic-appearing or diaphoretic.  HENT:     Head: Normocephalic.  Cardiovascular:     Rate and Rhythm: Normal rate and regular rhythm.     Heart sounds: Normal heart sounds.  Pulmonary:     Effort: Pulmonary effort is normal.     Breath sounds: Normal breath sounds.  Abdominal:     General: Abdomen is flat. Bowel sounds are normal. There is no distension.     Palpations: Abdomen is soft.     Tenderness: There is no abdominal tenderness. There is no guarding.     Hernia: A hernia is present. Hernia is present in the right inguinal area (Reducible).  Neurological:     Mental Status: He is alert.     ED Results / Procedures / Treatments   Labs (all labs ordered are listed, but only abnormal results are displayed) Labs Reviewed - No data to display  EKG None  Radiology CT ABDOMEN PELVIS WO CONTRAST  Result Date: 10/25/2022 CLINICAL DATA:  Right lower quadrant abdominal pain for 4 months, nausea for 1 week EXAM: CT ABDOMEN AND PELVIS WITHOUT CONTRAST TECHNIQUE: Multidetector  CT imaging of the abdomen and pelvis was performed following the standard protocol without IV contrast. Unenhanced CT was performed per clinician order. Lack of IV contrast limits sensitivity and specificity, especially for evaluation of abdominal/pelvic solid viscera. RADIATION DOSE REDUCTION: This exam was performed according to the departmental dose-optimization program which includes automated exposure control, adjustment of the mA and/or kV according to patient size and/or use of iterative reconstruction technique. COMPARISON:  11/04/2014 FINDINGS: Lower chest: There is ground-glass airspace disease within the visualized right middle lobe, which may reflect inflammation, infection, or aspiration. Benign 4 mm left lower lobe subpleural nodule image 5/2 is unchanged, and does not require specific imaging follow-up. Hepatobiliary: Unremarkable unenhanced  appearance of the liver and gallbladder. Pancreas: Unremarkable unenhanced appearance. Spleen: Unremarkable unenhanced appearance. Adrenals/Urinary Tract: No urinary tract calculi or obstructive uropathy within either kidney. Bilateral renal cortical scarring. The adrenals and bladder are unremarkable. Stomach/Bowel: No bowel obstruction or ileus. Normal retrocecal appendix. Diverticulosis of the sigmoid colon without evidence of diverticulitis. No bowel wall thickening or inflammatory change. Moderate stool throughout the colon consistent with constipation. Vascular/Lymphatic: Aortic atherosclerosis. No enlarged abdominal or pelvic lymph nodes. Reproductive: Prostate is unremarkable. Other: No free fluid or free intraperitoneal gas. There are bilateral fat containing inguinal hernias. Minimal fat stranding and fluid are seen within the right inguinal hernia, and could reflect incarcerated hernia. No bowel herniation. Musculoskeletal: No acute or destructive bony abnormalities. Reconstructed images demonstrate no additional findings. IMPRESSION: 1. Fat containing  right inguinal hernia, with mesenteric fat stranding and trace fluid. This could reflect incarcerated hernia. No bowel herniation. 2. Small unremarkable fat containing left inguinal hernia. 3. Normal appendix. 4. Sigmoid diverticulosis without diverticulitis. 5. Moderate stool throughout the colon consistent with constipation. No bowel obstruction or ileus. 6. Right middle lobe ground-glass airspace disease, consistent with inflammation, infection, or aspiration. 7.  Aortic Atherosclerosis (ICD10-I70.0). Electronically Signed   By: Sharlet Salina M.D.   On: 10/25/2022 17:04    Procedures Procedures    Medications Ordered in ED Medications - No data to display  ED Course/ Medical Decision Making/ A&P                                 Medical Decision Making Patient is a 77 year old male who presents to emergency department for evaluation of a right inguinal hernia after a CT scan showed fat-containing right inguinal hernia with mesenteric fat stranding and trace fluid.  Initial evaluation shows vital signs in normal limits, he is afebrile and well-appearing.  During physical exam, there is no right lower quadrant tenderness or discomfort.  There is a right inguinal hernia that is reducible during exam.  There is not an acute surgical abdomen during examination.  Patient was instructed to follow-up with PCP and with general surgery in which she has an appointment in 2 weeks.  He was asked to call the general surgery office and see if they can move his appointment up for sooner evaluation.  Patient does not require an emergent surgery at this time, his hernia is reproducible and he does not have signs of strangulation on physical exam.  He was discharged home in stable condition, appearing well.  Amount and/or Complexity of Data Reviewed External Data Reviewed: radiology and notes.    Details: Agree with radiologist finding and CT of abdomen.           Final Clinical Impression(s) / ED  Diagnoses Final diagnoses:  Reducible right inguinal hernia    Rx / DC Orders ED Discharge Orders     None         Faith Rogue, DO 10/26/22 2259    Melene Plan, DO 10/26/22 2302

## 2022-10-26 NOTE — ED Triage Notes (Signed)
Pt reports having a rt inguinal hernia that has been increasing in pain, had a CT done yesterday and referred to ED d/t potential incarcerated bowel

## 2022-10-29 HISTORY — PX: HERNIA REPAIR: SHX51

## 2022-11-01 ENCOUNTER — Other Ambulatory Visit: Payer: Self-pay

## 2022-11-01 ENCOUNTER — Ambulatory Visit (INDEPENDENT_AMBULATORY_CARE_PROVIDER_SITE_OTHER): Payer: PPO | Admitting: Internal Medicine

## 2022-11-01 ENCOUNTER — Other Ambulatory Visit (HOSPITAL_COMMUNITY): Payer: Self-pay

## 2022-11-01 VITALS — BP 120/70 | HR 90 | Temp 98.1°F | Ht 67.0 in | Wt 168.6 lb

## 2022-11-01 DIAGNOSIS — Z794 Long term (current) use of insulin: Secondary | ICD-10-CM

## 2022-11-01 DIAGNOSIS — Z7984 Long term (current) use of oral hypoglycemic drugs: Secondary | ICD-10-CM | POA: Diagnosis not present

## 2022-11-01 DIAGNOSIS — K4090 Unilateral inguinal hernia, without obstruction or gangrene, not specified as recurrent: Secondary | ICD-10-CM

## 2022-11-01 DIAGNOSIS — N182 Chronic kidney disease, stage 2 (mild): Secondary | ICD-10-CM | POA: Diagnosis not present

## 2022-11-01 DIAGNOSIS — E1122 Type 2 diabetes mellitus with diabetic chronic kidney disease: Secondary | ICD-10-CM

## 2022-11-01 DIAGNOSIS — I10 Essential (primary) hypertension: Secondary | ICD-10-CM | POA: Diagnosis not present

## 2022-11-01 DIAGNOSIS — R112 Nausea with vomiting, unspecified: Secondary | ICD-10-CM | POA: Diagnosis not present

## 2022-11-01 DIAGNOSIS — N183 Chronic kidney disease, stage 3 unspecified: Secondary | ICD-10-CM | POA: Diagnosis not present

## 2022-11-01 MED ORDER — ONDANSETRON 4 MG PO TBDP: 4.0000 mg | ORAL_TABLET | Freq: Three times a day (TID) | ORAL | 1 refills | Status: DC | PRN

## 2022-11-01 MED ORDER — ONDANSETRON 4 MG PO TBDP
4.0000 mg | ORAL_TABLET | Freq: Three times a day (TID) | ORAL | 1 refills | Status: DC | PRN
  Filled 2022-11-01: qty 30, 10d supply, fill #0

## 2022-11-01 MED ORDER — ONDANSETRON 4 MG PO TBDP
4.0000 mg | ORAL_TABLET | Freq: Three times a day (TID) | ORAL | 1 refills | Status: AC | PRN
  Filled 2022-11-01: qty 30, 10d supply, fill #0

## 2022-11-01 NOTE — Progress Notes (Signed)
Patient ID: Douglas Richards, male   DOB: 17-Apr-1945, 77 y.o.   MRN: [id number]        Chief Complaint: follow up with worsening n/v mild, RIH, ckd3a, dm, htn       HPI:  Douglas Richards is a 77 y.o. male here overall doing ok,  Denies worsening reflux, abd pain, dysphagia,  bowel change or blood but did have an episode n/v yesteday, hoping for nausea med today.  Pt denies chest pain, increased sob or doe, wheezing, orthopnea, PND, increased LE swelling, palpitations, dizziness or syncope.   Pt denies polydipsia, polyuria, or new focal neuro s/s.    Pt denies fever, wt loss, night sweats, loss of appetite, or other constitutional symptoms  .       Wt Readings from Last 3 Encounters:  11/01/22 168 lb 9.6 oz (76.5 kg)  10/26/22 172 lb (78 kg)  10/24/22 172 lb (78 kg)   BP Readings from Last 3 Encounters:  11/01/22 120/70  10/26/22 121/62  10/24/22 134/76         Past Medical History:  Diagnosis Date   Allergic rhinitis    Allergy    Anal fissure    Cataract    right eye    Chronic kidney disease    Stage 3   Diabetes mellitus 02/06/2009   type II   Diverticulosis of colon (without mention of hemorrhage)    GERD (gastroesophageal reflux disease)    Gout, unspecified    Herpes zoster without mention of complication    Hyperlipidemia    Hypertension    Osteoarthrosis, unspecified whether generalized or localized, unspecified site    Other abnormal glucose    Personal history of colonic polyps    Past Surgical History:  Procedure Laterality Date   CATARACT EXTRACTION Right    COLONOSCOPY  01/15/2014   HEMORRHOID SURGERY     LIPOMA EXCISION     from the neck   POLYPECTOMY     rotator cuff surgery Right    TONSILLECTOMY      reports that he has never smoked. He has never used smokeless tobacco. He reports that he does not currently use alcohol. He reports that he does not use drugs. family history includes Alcohol abuse in his brother and father; Arthritis in his brother; COPD  in his father and mother; Diabetes in his father; Heart disease in his brother and sister; Hypertension in his father, mother, and another family member; Stroke in his father and mother. Allergies  Allergen Reactions   Atorvastatin     REACTION: aches   Prednisolone Swelling    Red swollen face He had cortisone knee shot and was ok   Sulfonamide Derivatives     REACTION: nausea   Trulicity [Dulaglutide]     ?pancreatitis   Current Outpatient Medications on File Prior to Visit  Medication Sig Dispense Refill   allopurinol (ZYLOPRIM) 100 MG tablet Take 1/2 tablet (50 mg total) by mouth daily. 90 tablet 1   amLODipine (NORVASC) 5 MG tablet Take 1 tablet (5 mg total) by mouth daily. 90 tablet 1   ASPIRIN 81 PO Take by mouth.     Blood Glucose Monitoring Suppl (ACCU-CHEK GUIDE) w/Device KIT Use to check blood sugar twice daily. 1 kit 1   Cholecalciferol (VITAMIN D3) 1000 UNITS tablet Take 1,000 Units by mouth daily.     Glucosamine-Chondroitin 750-600 MG TABS Take by mouth 2 (two) times daily. Reported on 04/28/2015     glucose  blood (ACCU-CHEK GUIDE) test strip Use as instructed twice daily. 100 each 12   glucose blood (FREESTYLE LITE) test strip USE TO CHECK BLOOD SUGAR TWICE DAILY 100 strip 5   HYDROcodone-acetaminophen (NORCO/VICODIN) 5-325 MG tablet Take 1 tablet by mouth every 6 (six) hours as needed for severe pain. 120 tablet 0   Lancets (FREESTYLE) lancets USE TO CHECK BLOOD SUGAR TWICE DAILY. Annual appt is due w/lab must see provider for future refills 100 each 5   Lancets (ONETOUCH DELICA PLUS LANCET33G) MISC Use as directed to check blood sugar twice daily. 100 each 12   loratadine (CLARITIN) 10 MG tablet Take 10 mg by mouth daily.     metFORMIN (GLUCOPHAGE) 500 MG tablet Take 1 tablet (500 mg total) by mouth 2 (two) times daily with a meal. 180 tablet 3   methocarbamol (ROBAXIN) 500 MG tablet Take 1 tablet (500 mg total) by mouth every 8 (eight) hours as needed. 30 tablet 0    MINERAL OIL PO Take by mouth.     omeprazole (PRILOSEC) 20 MG capsule Take 1 capsule (20 mg total) by mouth daily. 90 capsule 3   pantoprazole (PROTONIX) 40 MG tablet Take 1 tablet (40 mg total) by mouth 2 (two) times daily. 60 tablet 5   Psyllium (METAMUCIL PO) Take by mouth.     repaglinide (PRANDIN) 1 MG tablet Take 1 tablet (1 mg total) by mouth 3 (three) times daily before meals. 270 tablet 3   rosuvastatin (CRESTOR) 10 MG tablet Take 1 tablet (10 mg total) by mouth daily. 90 tablet 3   Saw Palmetto, Serenoa repens, (SAW PALMETTO PO) Take by mouth.     tamsulosin (FLOMAX) 0.4 MG CAPS capsule Take 1 capsule (0.4 mg total) by mouth daily. 90 capsule 3   thiamine (VITAMIN B-1) 100 MG tablet Take 100 mg by mouth daily.     traMADol (ULTRAM) 50 MG tablet Take 1 tablet (50 mg total) by mouth every 6 (six) hours as needed. 30 tablet 0   trandolapril (MAVIK) 4 MG tablet Take 1 tablet (4 mg total) by mouth daily. 90 tablet 1   No current facility-administered medications on file prior to visit.        ROS:  All others reviewed and negative.  Objective        PE:  BP 120/70 (BP Location: Left Arm, Patient Position: Sitting, Cuff Size: Normal)   Pulse 90   Temp 98.1 F (36.7 C) (Oral)   Ht 5\' 7"  (1.702 m)   Wt 168 lb 9.6 oz (76.5 kg)   SpO2 98%   BMI 26.41 kg/m                 Constitutional: Pt appears in NAD               HENT: Head: NCAT.                Right Ear: External ear normal.                 Left Ear: External ear normal.                Eyes: . Pupils are equal, round, and reactive to light. Conjunctivae and EOM are normal               Nose: without d/c or deformity               Neck: Neck supple. Gross normal ROM  Cardiovascular: Normal rate and regular rhythm.                 Pulmonary/Chest: Effort normal and breath sounds without rales or wheezing.                Abd:  Soft, NT, ND, + BS, no organomegaly               Neurological: Pt is alert. At  baseline orientation, motor grossly intact               Skin: Skin is warm. No rashes, no other new lesions, LE edema - none               Psychiatric: Pt behavior is normal without agitation   Micro: none  Cardiac tracings I have personally interpreted today:  none  Pertinent Radiological findings (summarize): none   Lab Results  Component Value Date   WBC 8.5 10/24/2022   HGB 13.8 10/24/2022   HCT 41.7 10/24/2022   PLT 167.0 10/24/2022   GLUCOSE 114 (H) 10/24/2022   CHOL 111 07/05/2022   TRIG 118.0 07/05/2022   HDL 49.70 07/05/2022   LDLDIRECT 105.5 09/10/2013   LDLCALC 38 07/05/2022   ALT 18 10/24/2022   AST 20 10/24/2022   NA 126 (L) 10/24/2022   K 4.6 10/24/2022   CL 95 (L) 10/24/2022   CREATININE 1.90 (H) 10/24/2022   BUN 26 (H) 10/24/2022   CO2 23 10/24/2022   TSH 1.45 07/05/2022   PSA 0.81 09/17/2015   HGBA1C 7.3 (H) 10/13/2022   MICROALBUR 0.4 03/31/2010   Assessment/Plan:  ELDRED BOETTCHER is a 77 y.o. White or Caucasian [1] male with  has a past medical history of Allergic rhinitis, Allergy, Anal fissure, Cataract, Chronic kidney disease, Diabetes mellitus (02/06/2009), Diverticulosis of colon (without mention of hemorrhage), GERD (gastroesophageal reflux disease), Gout, unspecified, Herpes zoster without mention of complication, Hyperlipidemia, Hypertension, Osteoarthrosis, unspecified whether generalized or localized, unspecified site, Other abnormal glucose, and Personal history of colonic polyps.  Inguinal hernia Overall stable, f/u general surgury as planned  Nausea & vomiting Mild, for anti-emetic prn  CRI (chronic renal insufficiency), stage 3 (moderate) Lab Results  Component Value Date   CREATININE 1.90 (H) 10/24/2022   Stable overall, cont to avoid nephrotoxins   DM2 (diabetes mellitus, type 2) (HCC) Lab Results  Component Value Date   HGBA1C 7.3 (H) 10/13/2022   Mild uncontrolled, goal A1c < 7, pt to continue current medical treatment  metformin 500 bid, prandin 1 mg tid, declines other change for now   Essential hypertension BP Readings from Last 3 Encounters:  11/01/22 120/70  10/26/22 121/62  10/24/22 134/76   Stable, pt to continue medical treatment norvasc 5 every day, mavik 4 qd  Followup: Return if symptoms worsen or fail to improve.  Oliver Barre, MD 11/04/2022 7:58 PM [facility] Internal Medicine

## 2022-11-01 NOTE — Patient Instructions (Signed)
Please take all new medication as prescribed - the zofran as needed  Please continue all other medications as before, and refills have been done if requested.  Please have the pharmacy call with any other refills you may need.  Please keep your appointments with your specialists as you may have planned - General Surgury on Friday

## 2022-11-03 DIAGNOSIS — K4020 Bilateral inguinal hernia, without obstruction or gangrene, not specified as recurrent: Secondary | ICD-10-CM | POA: Diagnosis not present

## 2022-11-04 ENCOUNTER — Encounter: Payer: Self-pay | Admitting: Internal Medicine

## 2022-11-04 DIAGNOSIS — R112 Nausea with vomiting, unspecified: Secondary | ICD-10-CM | POA: Insufficient documentation

## 2022-11-04 NOTE — Assessment & Plan Note (Signed)
Mild, for anti-emetic prn

## 2022-11-04 NOTE — Assessment & Plan Note (Signed)
Lab Results  Component Value Date   HGBA1C 7.3 (H) 10/13/2022   Mild uncontrolled, goal A1c < 7, pt to continue current medical treatment metformin 500 bid, prandin 1 mg tid, declines other change for now

## 2022-11-04 NOTE — Assessment & Plan Note (Signed)
Overall stable, f/u general surgury as planned

## 2022-11-04 NOTE — Assessment & Plan Note (Signed)
BP Readings from Last 3 Encounters:  11/01/22 120/70  10/26/22 121/62  10/24/22 134/76   Stable, pt to continue medical treatment norvasc 5 every day, mavik 4 qd

## 2022-11-04 NOTE — Assessment & Plan Note (Signed)
Lab Results  Component Value Date   CREATININE 1.90 (H) 10/24/2022   Stable overall, cont to avoid nephrotoxins

## 2022-11-05 ENCOUNTER — Other Ambulatory Visit (HOSPITAL_COMMUNITY): Payer: Self-pay

## 2022-11-06 ENCOUNTER — Other Ambulatory Visit (HOSPITAL_COMMUNITY): Payer: Self-pay

## 2022-11-06 ENCOUNTER — Other Ambulatory Visit: Payer: Self-pay

## 2022-11-09 ENCOUNTER — Ambulatory Visit (INDEPENDENT_AMBULATORY_CARE_PROVIDER_SITE_OTHER): Payer: PPO | Admitting: Internal Medicine

## 2022-11-09 ENCOUNTER — Other Ambulatory Visit (HOSPITAL_COMMUNITY): Payer: Self-pay

## 2022-11-09 ENCOUNTER — Encounter: Payer: Self-pay | Admitting: Internal Medicine

## 2022-11-09 ENCOUNTER — Other Ambulatory Visit: Payer: Self-pay

## 2022-11-09 VITALS — BP 122/80 | HR 77 | Temp 98.3°F | Ht 67.0 in | Wt 172.0 lb

## 2022-11-09 DIAGNOSIS — R112 Nausea with vomiting, unspecified: Secondary | ICD-10-CM

## 2022-11-09 DIAGNOSIS — Z7984 Long term (current) use of oral hypoglycemic drugs: Secondary | ICD-10-CM | POA: Diagnosis not present

## 2022-11-09 DIAGNOSIS — N182 Chronic kidney disease, stage 2 (mild): Secondary | ICD-10-CM | POA: Diagnosis not present

## 2022-11-09 DIAGNOSIS — E1122 Type 2 diabetes mellitus with diabetic chronic kidney disease: Secondary | ICD-10-CM

## 2022-11-09 DIAGNOSIS — R21 Rash and other nonspecific skin eruption: Secondary | ICD-10-CM

## 2022-11-09 DIAGNOSIS — K4090 Unilateral inguinal hernia, without obstruction or gangrene, not specified as recurrent: Secondary | ICD-10-CM

## 2022-11-09 DIAGNOSIS — I10 Essential (primary) hypertension: Secondary | ICD-10-CM | POA: Diagnosis not present

## 2022-11-09 MED ORDER — HYDROXYZINE PAMOATE 25 MG PO CAPS
25.0000 mg | ORAL_CAPSULE | Freq: Three times a day (TID) | ORAL | 1 refills | Status: DC | PRN
  Filled 2022-11-09: qty 60, 20d supply, fill #0
  Filled 2023-06-10: qty 60, 20d supply, fill #1

## 2022-11-09 MED ORDER — CLOTRIMAZOLE-BETAMETHASONE 1-0.05 % EX CREA
1.0000 | TOPICAL_CREAM | Freq: Every day | CUTANEOUS | 1 refills | Status: DC
  Filled 2022-11-09: qty 30, 30d supply, fill #0
  Filled 2022-12-29: qty 30, 30d supply, fill #1

## 2022-11-09 NOTE — Progress Notes (Signed)
Patient ID: Douglas Richards, male   DOB: 04/19/1945, 77 y.o.   MRN: [id number]        Chief Complaint: follow up itching rash, nausea, bilateral inguinal hernias, htn, dm       HPI:  Douglas Richards is a 77 y.o. male here with c/o itching and mild rash to axillas and groin areas worse in the past wk, Nausea has resolved since last visit.  Has seen general surgury and has noted bilateral inguinal hernia per pt and for surgury oct 22   Pt denies chest pain, increased sob or doe, wheezing, orthopnea, PND, increased LE swelling, palpitations, dizziness or syncope.   Pt denies polydipsia, polyuria, or new focal neuro s/s.    Pt denies fever, wt loss, night sweats, loss of appetite, or other constitutional symptoms        Wt Readings from Last 3 Encounters:  11/09/22 172 lb (78 kg)  11/01/22 168 lb 9.6 oz (76.5 kg)  10/26/22 172 lb (78 kg)   BP Readings from Last 3 Encounters:  11/09/22 122/80  11/01/22 120/70  10/26/22 121/62         Past Medical History:  Diagnosis Date   Allergic rhinitis    Allergy    Anal fissure    Cataract    right eye    Chronic kidney disease    Stage 3   Diabetes mellitus 02/06/2009   type II   Diverticulosis of colon (without mention of hemorrhage)    GERD (gastroesophageal reflux disease)    Gout, unspecified    Herpes zoster without mention of complication    Hyperlipidemia    Hypertension    Osteoarthrosis, unspecified whether generalized or localized, unspecified site    Other abnormal glucose    Personal history of colonic polyps    Past Surgical History:  Procedure Laterality Date   CATARACT EXTRACTION Right    COLONOSCOPY  01/15/2014   HEMORRHOID SURGERY     LIPOMA EXCISION     from the neck   POLYPECTOMY     rotator cuff surgery Right    TONSILLECTOMY      reports that he has never smoked. He has never used smokeless tobacco. He reports that he does not currently use alcohol. He reports that he does not use drugs. family history includes  Alcohol abuse in his brother and father; Arthritis in his brother; COPD in his father and mother; Diabetes in his father; Heart disease in his brother and sister; Hypertension in his father, mother, and another family member; Stroke in his father and mother. Allergies  Allergen Reactions   Atorvastatin Other (See Comments)    Muscle/joint aches   Prednisolone Swelling    Red swollen face He had cortisone knee shot and was ok   Sulfonamide Derivatives Nausea Only   Trulicity [Dulaglutide]     ?pancreatitis   Current Outpatient Medications on File Prior to Visit  Medication Sig Dispense Refill   allopurinol (ZYLOPRIM) 100 MG tablet Take 1/2 tablet (50 mg total) by mouth daily. (Patient taking differently: Take 50 mg by mouth every evening.) 90 tablet 1   amLODipine (NORVASC) 5 MG tablet Take 1 tablet (5 mg total) by mouth daily. 90 tablet 1   aspirin EC 325 MG tablet Take 650 mg by mouth every 6 (six) hours as needed (pain.).     Blood Glucose Monitoring Suppl (ACCU-CHEK GUIDE) w/Device KIT Use to check blood sugar twice daily. 1 kit 1   Cholecalciferol (VITAMIN D3)  1000 UNITS tablet Take 1,000 Units by mouth every evening.     glucose blood (ACCU-CHEK GUIDE) test strip Use as instructed twice daily. 100 each 12   glucose blood (FREESTYLE LITE) test strip USE TO CHECK BLOOD SUGAR TWICE DAILY 100 strip 5   HYDROcodone-acetaminophen (NORCO/VICODIN) 5-325 MG tablet Take 1 tablet by mouth every 6 (six) hours as needed for severe pain. 120 tablet 0   Lancets (FREESTYLE) lancets USE TO CHECK BLOOD SUGAR TWICE DAILY. Annual appt is due w/lab must see provider for future refills 100 each 5   Lancets (ONETOUCH DELICA PLUS LANCET33G) MISC Use as directed to check blood sugar twice daily. 100 each 12   loratadine (CLARITIN) 10 MG tablet Take 10 mg by mouth in the morning.     metFORMIN (GLUCOPHAGE) 500 MG tablet Take 1 tablet (500 mg total) by mouth 2 (two) times daily with a meal. 180 tablet 3    methocarbamol (ROBAXIN) 500 MG tablet Take 1 tablet (500 mg total) by mouth every 8 (eight) hours as needed. 30 tablet 0   MINERAL OIL PO Take 15 mLs by mouth in the morning.     omeprazole (PRILOSEC) 20 MG capsule Take 1 capsule (20 mg total) by mouth daily. (Patient taking differently: Take 20 mg by mouth every evening.) 90 capsule 3   ondansetron (ZOFRAN-ODT) 4 MG disintegrating tablet Take 1 tablet (4 mg total) by mouth every 8 (eight) hours as needed for nausea or vomiting. 30 tablet 1   ondansetron (ZOFRAN-ODT) 4 MG disintegrating tablet Take 1 tablet (4 mg total) by mouth every 8 (eight) hours as needed for nausea or vomiting 30 tablet 1   pantoprazole (PROTONIX) 40 MG tablet Take 1 tablet (40 mg total) by mouth 2 (two) times daily. 60 tablet 5   Polyethyl Glycol-Propyl Glycol (LUBRICANT EYE DROPS) 0.4-0.3 % SOLN Place 1-2 drops into both eyes See admin instructions. Instill 1 drop into both eyes scheduled every morning & every 2 hours as needed for dry/irritated eyes.     Psyllium (METAMUCIL PO) Take 3.4 g by mouth in the morning. (Tablespoon)     repaglinide (PRANDIN) 1 MG tablet Take 1 tablet (1 mg total) by mouth 3 (three) times daily before meals. 270 tablet 3   rosuvastatin (CRESTOR) 10 MG tablet Take 1 tablet (10 mg total) by mouth daily. 90 tablet 3   tamsulosin (FLOMAX) 0.4 MG CAPS capsule Take 1 capsule (0.4 mg total) by mouth daily. 90 capsule 3   thiamine (VITAMIN B-1) 100 MG tablet Take 100 mg by mouth daily.     traMADol (ULTRAM) 50 MG tablet Take 1 tablet (50 mg total) by mouth every 6 (six) hours as needed. 30 tablet 0   trandolapril (MAVIK) 4 MG tablet Take 1 tablet (4 mg total) by mouth daily. 90 tablet 1   No current facility-administered medications on file prior to visit.        ROS:  All others reviewed and negative.  Objective        PE:  BP 122/80 (BP Location: Left Arm, Patient Position: Sitting, Cuff Size: Normal)   Pulse 77   Temp 98.3 F (36.8 C) (Oral)    Ht 5\' 7"  (1.702 m)   Wt 172 lb (78 kg)   SpO2 99%   BMI 26.94 kg/m                 Constitutional: Pt appears in NAD  HENT: Head: NCAT.                Right Ear: External ear normal.                 Left Ear: External ear normal.                Eyes: . Pupils are equal, round, and reactive to light. Conjunctivae and EOM are normal               Nose: without d/c or deformity               Neck: Neck supple. Gross normal ROM               Cardiovascular: Normal rate and regular rhythm.                 Pulmonary/Chest: Effort normal and breath sounds without rales or wheezing.                Abd:  Soft, NT, ND, + BS, no organomegaly               Neurological: Pt is alert. At baseline orientation, motor grossly intact               Skin: Skin is warm.  LE edema - none, faint erythema and itch to axilla and groin               Psychiatric: Pt behavior is normal without agitation   Micro: none  Cardiac tracings I have personally interpreted today:  none  Pertinent Radiological findings (summarize): none   Lab Results  Component Value Date   WBC 8.5 10/24/2022   HGB 13.8 10/24/2022   HCT 41.7 10/24/2022   PLT 167.0 10/24/2022   GLUCOSE 114 (H) 10/24/2022   CHOL 111 07/05/2022   TRIG 118.0 07/05/2022   HDL 49.70 07/05/2022   LDLDIRECT 105.5 09/10/2013   LDLCALC 38 07/05/2022   ALT 18 10/24/2022   AST 20 10/24/2022   NA 126 (L) 10/24/2022   K 4.6 10/24/2022   CL 95 (L) 10/24/2022   CREATININE 1.90 (H) 10/24/2022   BUN 26 (H) 10/24/2022   CO2 23 10/24/2022   TSH 1.45 07/05/2022   PSA 0.81 09/17/2015   HGBA1C 7.3 (H) 10/13/2022   MICROALBUR 0.4 03/31/2010   Assessment/Plan:  Douglas Richards is a 77 y.o. White or Caucasian [1] male with  has a past medical history of Allergic rhinitis, Allergy, Anal fissure, Cataract, Chronic kidney disease, Diabetes mellitus (02/06/2009), Diverticulosis of colon (without mention of hemorrhage), GERD (gastroesophageal reflux  disease), Gout, unspecified, Herpes zoster without mention of complication, Hyperlipidemia, Hypertension, Osteoarthrosis, unspecified whether generalized or localized, unspecified site, Other abnormal glucose, and Personal history of colonic polyps.  Inguinal hernia Stable overall, for general surgury oct 22  Nausea & vomiting Nicely resolved,  to f/u any worsening symptoms or concerns  Rash and nonspecific skin eruption Likely fungal it seems, for lotrisone topical asd  DM2 (diabetes mellitus, type 2) (HCC) Lab Results  Component Value Date   HGBA1C 7.3 (H) 10/13/2022   Uncontrolled, goal A1c < 7, pt to continue current medical treatment metfomrin 500 bid, prandind 1 mg tid   Essential hypertension BP Readings from Last 3 Encounters:  11/09/22 122/80  11/01/22 120/70  10/26/22 121/62   Stable, pt to continue medical treatment norvasc 5 every day, mavik 4 mg qd  Followup: No follow-ups on file.  Oliver Barre, MD  11/12/2022 5:11 PM [facility] [facility] - [facility] Medicine

## 2022-11-09 NOTE — Patient Instructions (Signed)
Please take all new medication as prescribed - the cream, and itching pills as needed  Please continue all other medications as before, and refills have been done if requested.  Please have the pharmacy call with any other refills you may need.  Please continue your efforts at being more active, low cholesterol diet, and weight control.  Please keep your appointments with your specialists as you may have planned  Good Luck with your Surgury!

## 2022-11-12 ENCOUNTER — Other Ambulatory Visit (HOSPITAL_COMMUNITY): Payer: Self-pay

## 2022-11-12 ENCOUNTER — Encounter: Payer: Self-pay | Admitting: Internal Medicine

## 2022-11-12 NOTE — Assessment & Plan Note (Signed)
Lab Results  Component Value Date   HGBA1C 7.3 (H) 10/13/2022   Uncontrolled, goal A1c < 7, pt to continue current medical treatment metfomrin 500 bid, prandind 1 mg tid

## 2022-11-12 NOTE — Assessment & Plan Note (Signed)
Stable overall, for general surgury oct 22

## 2022-11-12 NOTE — Assessment & Plan Note (Signed)
Nicely resolved,  to f/u any worsening symptoms or concerns

## 2022-11-12 NOTE — Assessment & Plan Note (Signed)
Likely fungal it seems, for lotrisone topical asd

## 2022-11-12 NOTE — Assessment & Plan Note (Signed)
BP Readings from Last 3 Encounters:  11/09/22 122/80  11/01/22 120/70  10/26/22 121/62   Stable, pt to continue medical treatment norvasc 5 every day, mavik 4 mg qd

## 2022-11-13 ENCOUNTER — Other Ambulatory Visit (HOSPITAL_COMMUNITY): Payer: Self-pay

## 2022-11-20 ENCOUNTER — Other Ambulatory Visit: Payer: Self-pay | Admitting: General Surgery

## 2022-11-20 ENCOUNTER — Ambulatory Visit: Payer: Self-pay | Admitting: General Surgery

## 2022-11-20 NOTE — Pre-Procedure Instructions (Signed)
Surgical Instructions   Your procedure is scheduled on Tuesday, October 22nd. Report to [facility] Main Entrance "A" at 10:00 A.M., then check in with the Admitting office. Any questions or running late day of surgery: call [phone]  Questions prior to your surgery date: call [phone], Monday-Friday, 8am-4pm. If you experience any cold or flu symptoms such as cough, fever, chills, shortness of breath, etc. between now and your scheduled surgery, please notify us at the above number.     Remember:  Do not eat or drink after midnight the night before your surgery    Take these medicines the morning of surgery with A SIP OF WATER  amLODipine (NORVASC)  loratadine (CLARITIN)  pantoprazole (PROTONIX)  Polyethyl Glycol-Propyl Glycol (LUBRICANT EYE DROPS)  rosuvastatin (CRESTOR)  tamsulosin [facility])     May take these medicines IF NEEDED: HYDROcodone-acetaminophen (NORCO/VICODIN)  hydrOXYzine (VISTARIL)  methocarbamol (ROBAXIN)  ondansetron (ZOFRAN-ODT)  traMADol (ULTRAM)   Follow your surgeon's instructions on when to stop Aspirin.  If no instructions were given by your surgeon then you will need to call the office to get those instructions.    One week prior to surgery, STOP taking any Aleve, Naproxen, Ibuprofen, Motrin, Advil, Goody's, BC's, all herbal medications, fish oil, and non-prescription vitamins.  WHAT DO I DO ABOUT MY DIABETES MEDICATION?   Do not take metFORMIN (GLUCOPHAGE) or repaglinide (PRANDIN) the morning of surgery.     HOW TO MANAGE YOUR DIABETES BEFORE AND AFTER SURGERY  Why is it important to control my blood sugar before and after surgery? Improving blood sugar levels before and after surgery helps healing and can limit problems. A way of improving blood sugar control is eating a healthy diet by:  Eating less sugar and carbohydrates  Increasing activity/exercise  Talking with your doctor about reaching your blood sugar goals High blood  sugars (greater than 180 mg/dL) can raise your risk of infections and slow your recovery, so you will need to focus on controlling your diabetes during the weeks before surgery. Make sure that the doctor who takes care of your diabetes knows about your planned surgery including the date and location.  How do I manage my blood sugar before surgery? Check your blood sugar at least 4 times a day, starting 2 days before surgery, to make sure that the level is not too high or low.  Check your blood sugar the morning of your surgery when you wake up and every 2 hours until you get to the Short Stay unit.  If your blood sugar is less than 70 mg/dL, you will need to treat for low blood sugar: Do not take insulin. Treat a low blood sugar (less than 70 mg/dL) with  cup of clear juice (cranberry or apple), 4 glucose tablets, OR glucose gel. Recheck blood sugar in 15 minutes after treatment (to make sure it is greater than 70 mg/dL). If your blood sugar is not greater than 70 mg/dL on recheck, call [phone] for further instructions. Report your blood sugar to the short stay nurse when you get to Short Stay.  If you are admitted to the hospital after surgery: Your blood sugar will be checked by the staff and you will probably be given insulin after surgery (instead of oral diabetes medicines) to make sure you have good blood sugar levels. The goal for blood sugar control after surgery is 80-180 mg/dL.  Do NOT Smoke (Tobacco/Vaping) for 24 hours prior to your procedure.  If you use a CPAP at night, you may bring your mask/headgear for your overnight stay.   You will be asked to remove any contacts, glasses, piercing's, hearing aid's, dentures/partials prior to surgery. Please bring cases for these items if needed.    Patients discharged the day of surgery will not be allowed to drive home, and someone needs to stay with them for 24 hours.  SURGICAL WAITING ROOM  VISITATION Patients may have no more than 2 support people in the waiting area - these visitors may rotate.   Pre-op nurse will coordinate an appropriate time for 1 ADULT support person, who may not rotate, to accompany patient in pre-op.  Children under the age of 35 must have an adult with them who is not the patient and must remain in the main waiting area with an adult.  If the patient needs to stay at the hospital during part of their recovery, the visitor guidelines for inpatient rooms apply.  Please refer to the [facility] website for the visitor guidelines for any additional information.   If you received a COVID test during your pre-op visit  it is requested that you wear a mask when out in public, stay away from anyone that may not be feeling well and notify your surgeon if you develop symptoms. If you have been in contact with anyone that has tested positive in the last 10 days please notify you surgeon.      Pre-operative CHG Bathing Instructions   You can play a key role in reducing the risk of infection after surgery. Your skin needs to be as free of germs as possible. You can reduce the number of germs on your skin by washing with CHG (chlorhexidine gluconate) soap before surgery. CHG is an antiseptic soap that kills germs and continues to kill germs even after washing.   DO NOT use if you have an allergy to chlorhexidine/CHG or antibacterial soaps. If your skin becomes reddened or irritated, stop using the CHG and notify one of our RNs at [phone].              TAKE A SHOWER THE NIGHT BEFORE SURGERY AND THE DAY OF SURGERY    Please keep in mind the following:  DO NOT shave, including legs and underarms, 48 hours prior to surgery.   You may shave your face before/day of surgery.  Place clean sheets on your bed the night before surgery Use a clean washcloth (not used since being washed) for each shower. DO NOT sleep with pet's night before surgery.  CHG Shower  Instructions:  Wash your face and private area with normal soap. If you choose to wash your hair, wash first with your normal shampoo.  After you use shampoo/soap, rinse your hair and body thoroughly to remove shampoo/soap residue.  Turn the water OFF and apply half the bottle of CHG soap to a CLEAN washcloth.  Apply CHG soap ONLY FROM YOUR NECK DOWN TO YOUR TOES (washing for 3-5 minutes)  DO NOT use CHG soap on face, private areas, open wounds, or sores.  Pay special attention to the area where your surgery is being performed.  If you are having back surgery, having someone wash your back for you may be helpful. Wait 2 minutes after CHG soap is applied, then you may rinse off the CHG soap.  Pat dry with a clean towel  Put on clean pajamas  Additional instructions for the day of surgery: DO NOT APPLY any lotions, deodorants, cologne, or perfumes.   Do not wear jewelry or makeup Do not wear nail polish, gel polish, artificial nails, or any other type of covering on natural nails (fingers and toes) Do not bring valuables to the hospital. [facility] is not responsible for valuables/personal belongings. Put on clean/comfortable clothes.  Please brush your teeth.  Ask your nurse before applying any prescription medications to the skin.

## 2022-11-21 ENCOUNTER — Encounter (HOSPITAL_COMMUNITY): Payer: Self-pay

## 2022-11-21 ENCOUNTER — Encounter (HOSPITAL_COMMUNITY)
Admission: RE | Admit: 2022-11-21 | Discharge: 2022-11-21 | Disposition: A | Discharge status: Home / Self Care | Payer: PPO | Source: Ambulatory Visit | Attending: General Surgery | Admitting: General Surgery

## 2022-11-21 ENCOUNTER — Other Ambulatory Visit: Payer: Self-pay

## 2022-11-21 VITALS — BP 157/77 | HR 78 | Temp 98.2°F | Resp 18 | Ht 67.0 in | Wt 174.1 lb

## 2022-11-21 DIAGNOSIS — I10 Essential (primary) hypertension: Secondary | ICD-10-CM | POA: Diagnosis not present

## 2022-11-21 DIAGNOSIS — Z01812 Encounter for preprocedural laboratory examination: Secondary | ICD-10-CM | POA: Insufficient documentation

## 2022-11-21 DIAGNOSIS — E119 Type 2 diabetes mellitus without complications: Secondary | ICD-10-CM | POA: Insufficient documentation

## 2022-11-21 DIAGNOSIS — Z01818 Encounter for other preprocedural examination: Secondary | ICD-10-CM

## 2022-11-21 LAB — CBC
HCT: 39.6 % (ref 39.0–52.0)
Hemoglobin: 12.5 g/dL — ABNORMAL LOW (ref 13.0–17.0)
MCH: 30.3 pg (ref 26.0–34.0)
MCHC: 31.6 g/dL (ref 30.0–36.0)
MCV: 95.9 fL (ref 80.0–100.0)
Platelets: 166 10*3/uL (ref 150–400)
RBC: 4.13 MIL/uL — ABNORMAL LOW (ref 4.22–5.81)
RDW: 12.7 % (ref 11.5–15.5)
WBC: 8.2 10*3/uL (ref 4.0–10.5)
nRBC: 0 % (ref 0.0–0.2)

## 2022-11-21 LAB — BASIC METABOLIC PANEL
Anion gap: 11 (ref 5–15)
BUN: 15 mg/dL (ref 8–23)
CO2: 24 mmol/L (ref 22–32)
Calcium: 9.4 mg/dL (ref 8.9–10.3)
Chloride: 102 mmol/L (ref 98–111)
Creatinine, Ser: 1.52 mg/dL — ABNORMAL HIGH (ref 0.61–1.24)
GFR, Estimated: 47 mL/min — ABNORMAL LOW (ref >60)
Glucose, Bld: 138 mg/dL — ABNORMAL HIGH (ref 70–99)
Potassium: 4.6 mmol/L (ref 3.5–5.1)
Sodium: 137 mmol/L (ref 135–145)

## 2022-11-21 LAB — GLUCOSE, CAPILLARY: Glucose-Capillary: 147 mg/dL — ABNORMAL HIGH (ref 70–99)

## 2022-11-21 NOTE — Progress Notes (Signed)
PCP - Dr. Jacinta Shoe Cardiologist - denies Nephrologist- Dr. Thedore Mins  PPM/ICD - denies   Chest x-ray - 12/14/17 EKG - 10/13/22 Stress Test - denies ECHO - denies Cardiac Cath - denies  Sleep Study - denies   Fasting Blood Sugar - 90-100 Checks Blood Sugar once a day (in AM)  Last dose of GLP1 agonist-  n/a   Blood Thinner Instructions: n/a Aspirin Instructions: f/u with surgeon  ERAS Protcol - no, NPO   COVID TEST- n/a   Anesthesia review: no  Patient denies shortness of breath, fever, cough and chest pain at PAT appointment   All instructions explained to the patient, with a verbal understanding of the material. Patient agrees to go over the instructions while at home for a better understanding.  The opportunity to ask questions was provided.

## 2022-11-27 NOTE — Plan of Care (Signed)
CHL Tonsillectomy/Adenoidectomy, Postoperative PEDS care plan entered in error.

## 2022-11-28 ENCOUNTER — Encounter (HOSPITAL_COMMUNITY): Payer: Self-pay | Admitting: General Surgery

## 2022-11-28 ENCOUNTER — Ambulatory Visit (HOSPITAL_COMMUNITY): Payer: PPO

## 2022-11-28 ENCOUNTER — Other Ambulatory Visit (HOSPITAL_COMMUNITY): Payer: Self-pay

## 2022-11-28 ENCOUNTER — Encounter (HOSPITAL_COMMUNITY)
Admission: RE | Disposition: A | Discharge status: Home / Self Care | Payer: Self-pay | Source: Home / Self Care | Attending: General Surgery

## 2022-11-28 ENCOUNTER — Other Ambulatory Visit: Payer: Self-pay

## 2022-11-28 ENCOUNTER — Ambulatory Visit (HOSPITAL_COMMUNITY)
Admission: RE | Admit: 2022-11-28 | Discharge: 2022-11-28 | Disposition: A | Discharge status: Home / Self Care | Payer: PPO | Attending: General Surgery | Admitting: General Surgery

## 2022-11-28 ENCOUNTER — Ambulatory Visit (HOSPITAL_BASED_OUTPATIENT_CLINIC_OR_DEPARTMENT_OTHER): Payer: PPO

## 2022-11-28 DIAGNOSIS — Z7984 Long term (current) use of oral hypoglycemic drugs: Secondary | ICD-10-CM | POA: Diagnosis not present

## 2022-11-28 DIAGNOSIS — K219 Gastro-esophageal reflux disease without esophagitis: Secondary | ICD-10-CM | POA: Diagnosis not present

## 2022-11-28 DIAGNOSIS — E1122 Type 2 diabetes mellitus with diabetic chronic kidney disease: Secondary | ICD-10-CM | POA: Insufficient documentation

## 2022-11-28 DIAGNOSIS — N183 Chronic kidney disease, stage 3 unspecified: Secondary | ICD-10-CM | POA: Diagnosis not present

## 2022-11-28 DIAGNOSIS — K4020 Bilateral inguinal hernia, without obstruction or gangrene, not specified as recurrent: Secondary | ICD-10-CM | POA: Diagnosis not present

## 2022-11-28 DIAGNOSIS — I129 Hypertensive chronic kidney disease with stage 1 through stage 4 chronic kidney disease, or unspecified chronic kidney disease: Secondary | ICD-10-CM | POA: Insufficient documentation

## 2022-11-28 DIAGNOSIS — M1990 Unspecified osteoarthritis, unspecified site: Secondary | ICD-10-CM | POA: Insufficient documentation

## 2022-11-28 DIAGNOSIS — E119 Type 2 diabetes mellitus without complications: Secondary | ICD-10-CM

## 2022-11-28 LAB — GLUCOSE, CAPILLARY
Glucose-Capillary: 127 mg/dL — ABNORMAL HIGH (ref 70–99)
Glucose-Capillary: 187 mg/dL — ABNORMAL HIGH (ref 70–99)

## 2022-11-28 SURGERY — HERNIORRHAPHY, INGUINAL, ROBOT-ASSISTED, LAPAROSCOPIC
Anesthesia: General | Laterality: Bilateral

## 2022-11-28 MED ORDER — FENTANYL CITRATE (PF) 250 MCG/5ML IJ SOLN
INTRAMUSCULAR | Status: DC | PRN
  Administered 2022-11-28 (×4): 50 ug via INTRAVENOUS

## 2022-11-28 MED ORDER — FENTANYL CITRATE (PF) 250 MCG/5ML IJ SOLN
INTRAMUSCULAR | Status: AC
  Filled 2022-11-28: qty 5

## 2022-11-28 MED ORDER — ORAL CARE MOUTH RINSE: 15.0000 mL | Freq: Once | OROMUCOSAL | Status: AC

## 2022-11-28 MED ORDER — VASOPRESSIN 20 UNIT/ML IV SOLN
INTRAVENOUS | Status: AC
  Filled 2022-11-28: qty 1

## 2022-11-28 MED ORDER — PROPOFOL 10 MG/ML IV BOLUS
INTRAVENOUS | Status: DC | PRN
  Administered 2022-11-28: 130 mg via INTRAVENOUS

## 2022-11-28 MED ORDER — ONDANSETRON HCL 4 MG/2ML IJ SOLN
INTRAMUSCULAR | Status: AC
  Filled 2022-11-28: qty 2

## 2022-11-28 MED ORDER — OXYCODONE HCL 5 MG PO TABS
5.0000 mg | ORAL_TABLET | Freq: Three times a day (TID) | ORAL | 0 refills | Status: AC | PRN
  Filled 2022-11-28 (×2): qty 12, 4d supply, fill #0

## 2022-11-28 MED ORDER — BUPIVACAINE LIPOSOME 1.3 % IJ SUSP
INTRAMUSCULAR | Status: DC | PRN
  Administered 2022-11-28: 20 mL

## 2022-11-28 MED ORDER — SODIUM CHLORIDE 0.9% FLUSH: 10.0000 mL | Freq: Two times a day (BID) | INTRAVENOUS | Status: DC

## 2022-11-28 MED ORDER — ROCURONIUM BROMIDE 10 MG/ML (PF) SYRINGE
PREFILLED_SYRINGE | INTRAVENOUS | Status: AC
  Filled 2022-11-28: qty 10

## 2022-11-28 MED ORDER — MIDAZOLAM HCL 2 MG/2ML IJ SOLN
INTRAMUSCULAR | Status: AC
  Filled 2022-11-28: qty 2

## 2022-11-28 MED ORDER — LACTATED RINGERS IV SOLN: INTRAVENOUS | Status: DC

## 2022-11-28 MED ORDER — DEXAMETHASONE SODIUM PHOSPHATE 10 MG/ML IJ SOLN
INTRAMUSCULAR | Status: DC | PRN
  Administered 2022-11-28: 4 mg via INTRAVENOUS

## 2022-11-28 MED ORDER — OXYCODONE HCL 5 MG PO TABS
ORAL_TABLET | ORAL | Status: AC
  Filled 2022-11-28: qty 1

## 2022-11-28 MED ORDER — ACETAMINOPHEN 325 MG PO TABS: 650.0000 mg | ORAL_TABLET | ORAL | Status: DC | PRN

## 2022-11-28 MED ORDER — DEXAMETHASONE SODIUM PHOSPHATE 10 MG/ML IJ SOLN
INTRAMUSCULAR | Status: AC
  Filled 2022-11-28: qty 1

## 2022-11-28 MED ORDER — ENOXAPARIN SODIUM 40 MG/0.4ML IJ SOSY
40.0000 mg | PREFILLED_SYRINGE | Freq: Once | INTRAMUSCULAR | Status: AC
  Administered 2022-11-28: 40 mg via SUBCUTANEOUS
  Filled 2022-11-28: qty 0.4

## 2022-11-28 MED ORDER — SUGAMMADEX SODIUM 200 MG/2ML IV SOLN
INTRAVENOUS | Status: DC | PRN
  Administered 2022-11-28 (×2): 200 mg via INTRAVENOUS

## 2022-11-28 MED ORDER — BUPIVACAINE LIPOSOME 1.3 % IJ SUSP
INTRAMUSCULAR | Status: AC
  Filled 2022-11-28: qty 20

## 2022-11-28 MED ORDER — SODIUM CHLORIDE 0.9% FLUSH: 3.0000 mL | INTRAVENOUS | Status: DC | PRN

## 2022-11-28 MED ORDER — CHLORHEXIDINE GLUCONATE 0.12 % MT SOLN
15.0000 mL | Freq: Once | OROMUCOSAL | Status: AC
  Administered 2022-11-28: 15 mL via OROMUCOSAL
  Filled 2022-11-28: qty 15

## 2022-11-28 MED ORDER — OXYCODONE HCL 5 MG PO TABS
5.0000 mg | ORAL_TABLET | ORAL | Status: DC | PRN
Start: 2022-11-28 — End: 2022-11-28

## 2022-11-28 MED ORDER — PHENYLEPHRINE 80 MCG/ML (10ML) SYRINGE FOR IV PUSH (FOR BLOOD PRESSURE SUPPORT)
PREFILLED_SYRINGE | INTRAVENOUS | Status: DC | PRN
  Administered 2022-11-28: 160 ug via INTRAVENOUS
  Administered 2022-11-28: 80 ug via INTRAVENOUS

## 2022-11-28 MED ORDER — SODIUM CHLORIDE (PF) 0.9 % IJ SOLN
INTRAMUSCULAR | Status: DC | PRN
  Administered 2022-11-28: 20 mL

## 2022-11-28 MED ORDER — ACETAMINOPHEN 650 MG RE SUPP: 650.0000 mg | RECTAL | Status: DC | PRN

## 2022-11-28 MED ORDER — ALBUMIN HUMAN 5 % IV SOLN: INTRAVENOUS | Status: DC | PRN

## 2022-11-28 MED ORDER — HYDROMORPHONE HCL 1 MG/ML IJ SOLN
0.2500 mg | INTRAMUSCULAR | Status: DC | PRN
Start: 2022-11-28 — End: 2022-11-28

## 2022-11-28 MED ORDER — ROCURONIUM BROMIDE 10 MG/ML (PF) SYRINGE
PREFILLED_SYRINGE | INTRAVENOUS | Status: DC | PRN
  Administered 2022-11-28: 80 mg via INTRAVENOUS
  Administered 2022-11-28: 10 mg via INTRAVENOUS
  Administered 2022-11-28: 20 mg via INTRAVENOUS

## 2022-11-28 MED ORDER — ONDANSETRON HCL 4 MG/2ML IJ SOLN
INTRAMUSCULAR | Status: DC | PRN
  Administered 2022-11-28: 4 mg via INTRAVENOUS

## 2022-11-28 MED ORDER — LIDOCAINE 2% (20 MG/ML) 5 ML SYRINGE
INTRAMUSCULAR | Status: DC | PRN
  Administered 2022-11-28: 60 mg via INTRAVENOUS

## 2022-11-28 MED ORDER — OXYCODONE HCL 5 MG PO TABS
5.0000 mg | ORAL_TABLET | Freq: Once | ORAL | Status: AC | PRN
  Administered 2022-11-28: 5 mg via ORAL

## 2022-11-28 MED ORDER — BUPIVACAINE HCL 0.25 % IJ SOLN
INTRAMUSCULAR | Status: DC | PRN
  Administered 2022-11-28: 10 mL

## 2022-11-28 MED ORDER — ACETAMINOPHEN 325 MG PO TABS
650.0000 mg | ORAL_TABLET | Freq: Four times a day (QID) | ORAL | 0 refills | Status: AC
  Filled 2022-11-28 (×2): qty 50, 7d supply, fill #0

## 2022-11-28 MED ORDER — BUPIVACAINE HCL (PF) 0.25 % IJ SOLN
INTRAMUSCULAR | Status: AC
  Filled 2022-11-28: qty 30

## 2022-11-28 MED ORDER — CHLORHEXIDINE GLUCONATE CLOTH 2 % EX PADS: 6.0000 | MEDICATED_PAD | Freq: Once | CUTANEOUS | Status: DC

## 2022-11-28 MED ORDER — OXYCODONE HCL 5 MG/5ML PO SOLN: 5.0000 mg | Freq: Once | ORAL | Status: AC | PRN

## 2022-11-28 MED ORDER — PHENYLEPHRINE HCL-NACL 20-0.9 MG/250ML-% IV SOLN
INTRAVENOUS | Status: DC | PRN
  Administered 2022-11-28: 100 ug/min via INTRAVENOUS

## 2022-11-28 MED ORDER — CEFAZOLIN SODIUM-DEXTROSE 2-4 GM/100ML-% IV SOLN
2.0000 g | INTRAVENOUS | Status: AC
  Administered 2022-11-28: 2 g via INTRAVENOUS
  Filled 2022-11-28: qty 100

## 2022-11-28 MED ORDER — MIDAZOLAM HCL 2 MG/2ML IJ SOLN
INTRAMUSCULAR | Status: DC | PRN
  Administered 2022-11-28: 1 mg via INTRAVENOUS

## 2022-11-28 MED ORDER — PROPOFOL 10 MG/ML IV BOLUS
INTRAVENOUS | Status: AC
  Filled 2022-11-28: qty 20

## 2022-11-28 MED ORDER — SODIUM CHLORIDE 0.9% FLUSH: 3.0000 mL | Freq: Two times a day (BID) | INTRAVENOUS | Status: DC

## 2022-11-28 MED ORDER — LIDOCAINE 2% (20 MG/ML) 5 ML SYRINGE
INTRAMUSCULAR | Status: AC
  Filled 2022-11-28: qty 5

## 2022-11-28 MED ORDER — VASOPRESSIN 20 UNIT/ML IV SOLN
INTRAVENOUS | Status: DC | PRN
  Administered 2022-11-28: .25 [IU] via INTRAVENOUS
  Administered 2022-11-28 (×3): 1 [IU] via INTRAVENOUS
  Administered 2022-11-28: .5 [IU] via INTRAVENOUS

## 2022-11-28 MED ORDER — ONDANSETRON HCL 4 MG/2ML IJ SOLN: 4.0000 mg | Freq: Once | INTRAMUSCULAR | Status: DC | PRN

## 2022-11-28 MED ORDER — 0.9 % SODIUM CHLORIDE (POUR BTL) OPTIME
TOPICAL | Status: DC | PRN
  Administered 2022-11-28: 1000 mL

## 2022-11-28 MED ORDER — INSULIN ASPART 100 UNIT/ML IJ SOLN: 0.0000 [IU] | INTRAMUSCULAR | Status: DC | PRN

## 2022-11-28 MED ORDER — IBUPROFEN 800 MG PO TABS
800.0000 mg | ORAL_TABLET | Freq: Four times a day (QID) | ORAL | 0 refills | Status: AC
  Filled 2022-11-28 (×2): qty 25, 7d supply, fill #0

## 2022-11-28 SURGICAL SUPPLY — 57 items
ADH SKN CLS APL DERMABOND .7 (GAUZE/BANDAGES/DRESSINGS) ×1
APL PRP STRL LF DISP 70% ISPRP (MISCELLANEOUS) ×1
BAG COUNTER SPONGE SURGICOUNT (BAG) IMPLANT
BAG SPNG CNTER NS LX DISP (BAG)
CHLORAPREP W/TINT 26 (MISCELLANEOUS) ×1 IMPLANT
COVER MAYO STAND STRL (DRAPES) ×1 IMPLANT
COVER SURGICAL LIGHT HANDLE (MISCELLANEOUS) ×1 IMPLANT
COVER TIP SHEARS 8 DVNC (MISCELLANEOUS) ×1 IMPLANT
DEFOGGER SCOPE WARMER CLEARIFY (MISCELLANEOUS) IMPLANT
DERMABOND ADVANCED .7 DNX12 (GAUZE/BANDAGES/DRESSINGS) ×1 IMPLANT
DEVICE TROCAR PUNCTURE CLOSURE (ENDOMECHANICALS) IMPLANT
DRAPE ARM DVNC X/XI (DISPOSABLE) ×4 IMPLANT
DRAPE COLUMN DVNC XI (DISPOSABLE) ×1 IMPLANT
DRAPE CV SPLIT W-CLR ANES SCRN (DRAPES) ×1 IMPLANT
DRAPE ORTHO SPLIT 77X108 STRL (DRAPES) ×1
DRAPE SURG ORHT 6 SPLT 77X108 (DRAPES) ×1 IMPLANT
DRIVER NDL MEGA 8 DVNC XI (INSTRUMENTS) ×1 IMPLANT
DRIVER NDLE MEGA DVNC XI (INSTRUMENTS) ×1
ELECT REM PT RETURN 9FT ADLT (ELECTROSURGICAL) ×1
ELECTRODE REM PT RTRN 9FT ADLT (ELECTROSURGICAL) ×1 IMPLANT
FORCEPS BPLR 8 MD DVNC XI (FORCEP) ×1 IMPLANT
GLOVE BIO SURGEON STRL SZ7 (GLOVE) ×1 IMPLANT
GOWN STRL REUS W/ TWL LRG LVL3 (GOWN DISPOSABLE) ×2 IMPLANT
GOWN STRL REUS W/ TWL XL LVL3 (GOWN DISPOSABLE) ×2 IMPLANT
GOWN STRL REUS W/TWL 2XL LVL3 (GOWN DISPOSABLE) ×1 IMPLANT
GOWN STRL REUS W/TWL LRG LVL3 (GOWN DISPOSABLE) ×2
GOWN STRL REUS W/TWL XL LVL3 (GOWN DISPOSABLE) ×2
KIT BASIN OR (CUSTOM PROCEDURE TRAY) ×1 IMPLANT
KIT TURNOVER KIT B (KITS) ×1 IMPLANT
MARKER SKIN DUAL TIP RULER LAB (MISCELLANEOUS) ×1 IMPLANT
MESH 3DMAX MID 4X6 RT LRG (Mesh General) IMPLANT
MESH 3DMAX MID 5X7 RT XLRG (Mesh General) IMPLANT
NDL HYPO 22X1.5 SAFETY MO (MISCELLANEOUS) ×1 IMPLANT
NDL INSUFFLATION 14GA 120MM (NEEDLE) ×1 IMPLANT
NEEDLE HYPO 22X1.5 SAFETY MO (MISCELLANEOUS) ×1
NEEDLE INSUFFLATION 14GA 120MM (NEEDLE) ×1
OBTURATOR OPTICAL STND 8 DVNC (TROCAR)
OBTURATOR OPTICALSTD 8 DVNC (TROCAR) IMPLANT
PAD ARMBOARD 7.5X6 YLW CONV (MISCELLANEOUS) ×2 IMPLANT
SCISSORS MNPLR CVD DVNC XI (INSTRUMENTS) ×1 IMPLANT
SEAL UNIV 5-12 XI (MISCELLANEOUS) ×3 IMPLANT
SET TUBE SMOKE EVAC HIGH FLOW (TUBING) ×1 IMPLANT
SPIKE FLUID TRANSFER (MISCELLANEOUS) ×1 IMPLANT
STOPCOCK 4 WAY LG BORE MALE ST (IV SETS) ×1 IMPLANT
SUT MNCRL AB 4-0 PS2 18 (SUTURE) ×1 IMPLANT
SUT VIC AB 2-0 SH 27 (SUTURE) ×4
SUT VIC AB 2-0 SH 27X BRD (SUTURE) IMPLANT
SUT VIC AB 2-0 SH 27XBRD (SUTURE) IMPLANT
SUT VIC AB 3-0 SH 27 (SUTURE) ×2
SUT VIC AB 3-0 SH 27X BRD (SUTURE) ×1 IMPLANT
SUT VIC AB 3-0 SH 27XBRD (SUTURE) ×1 IMPLANT
SUT VLOC 180 2-0 9IN GS21 (SUTURE) IMPLANT
SUT VLOC BARB 180 ABS3/0GR12 (SUTURE) ×1
SUTURE VLOC BRB 180 ABS3/0GR12 (SUTURE) ×1 IMPLANT
SYR 30ML SLIP (SYRINGE) ×1 IMPLANT
TOWEL GREEN STERILE FF (TOWEL DISPOSABLE) ×1 IMPLANT
TRAY LAPAROSCOPIC MC (CUSTOM PROCEDURE TRAY) ×1 IMPLANT

## 2022-11-28 NOTE — Anesthesia Postprocedure Evaluation (Signed)
Anesthesia Post Note  Patient: DIONDRE BERGANZA  Procedure(s) Performed: XI ROBOTIC ASSISTED BILATERAL INGUINAL HERNIA (Bilateral)     Patient location during evaluation: PACU Anesthesia Type: General Level of consciousness: awake and alert, oriented and patient cooperative Pain management: pain level controlled Vital Signs Assessment: post-procedure vital signs reviewed and stable Respiratory status: spontaneous breathing, nonlabored ventilation and respiratory function stable Cardiovascular status: blood pressure returned to baseline and stable Postop Assessment: no apparent nausea or vomiting Anesthetic complications: no   No notable events documented.  Last Vitals:  Vitals:   11/28/22 1545 11/28/22 1600  BP: 111/63 112/62  Pulse: 78 74  Resp: 18 14  Temp:  36.6 C  SpO2: 95% 96%    Last Pain:  Vitals:   11/28/22 1006  TempSrc:   PainSc: 2    Pain Goal: Patients Stated Pain Goal: 0 (11/28/22 1006)                 Lannie Fields

## 2022-11-28 NOTE — Anesthesia Procedure Notes (Addendum)
Procedure Name: Intubation Date/Time: 11/28/2022 12:05 PM  Performed by: Camillia Herter, CRNAPre-anesthesia Checklist: Patient identified, Emergency Drugs available, Suction available and Patient being monitored Patient Re-evaluated:Patient Re-evaluated prior to induction Oxygen Delivery Method: Circle System Utilized Preoxygenation: Pre-oxygenation with 100% oxygen Induction Type: IV induction Ventilation: Mask ventilation without difficulty Laryngoscope Size: Mac and 4 Grade View: Grade II Tube type: Oral Laser Tube: Cuffed inflated with minimal occlusive pressure - saline Tube size: 7.5 mm Number of attempts: 1 Airway Equipment and Method: Stylet and Oral airway Placement Confirmation: ETT inserted through vocal cords under direct vision, positive ETCO2 and breath sounds checked- equal and bilateral Secured at: 21 cm Tube secured with: Tape Dental Injury: Teeth and Oropharynx as per pre-operative assessment

## 2022-11-28 NOTE — Discharge Instructions (Signed)
Home Care After Hernia Repair   Activity  Limit activity for the first 24 hours, then you may return to normal daily activities. Returning to normal daily activities as soon as you can following surgery will enhance recovery time.  No heavy lifting pushing or pulling, anything heavier than 10 pounds (gallon of milk weighs approx. 8.8 pounds) for 4-6 weeks from surgery date.  Do not mow the lawn, use a vacuum cleaner, or do any other strenuous activities without first consulting your surgical team.  Climb stairs slowly and watch your step.  Walk as often as you feel able to increase strength and endurance.  No driving or operating heavy machinery within 24 hours of taking narcotic pain medication.  Diet  Drink plenty of fluids and eat a light meal on the night of surgery. Some patients may find their appetite is poor for a week or two after surgery. This is a normal result of the stress of surgery-your appetite will return in time.   There are no specific diet restrictions after surgery.  Dressings and Wound Care  Keep your wound or incision site clean and dry.  You may have different types of dressings covering your incisions depending on your operation and your surgeon: o Dermabond/Durabond (skin glue): This will usually remain in place for 10-14 days, then naturally fall off your skin. You may take a shower 24 hrs after surgery, carefully wash, not scrub the incision site with a mild non-scented soap. Pat dry with a soft towel.  Do not pick or peel skin glue off.  Do not use creams, powder, salves or balms on your incision(s).    What to Expect After Surgery   Moderate discomfort controlled with medications  Minimal drainage from incision  You may feel pain in one or both shoulders. This pain comes from the gas still left in your belly after the surgery, if you had laparoscopic surgery (several small incisions). The pain should ease over several days to a week. Ambulation will help with  this pain.   Belly swelling  Feeling fatigue and weak  Constipation after surgery is common. Drink plenty fluids and eat a high fiber diet.  Swelling - In some patients might feel that their hernia has returned after surgery-DO NOT Worry this is normal. Swelling may be due to the development of a seroma. Seroma is fluid that has built up where the hernia was repaired this is a normal result after surgery and it will slowly reabsorb back into your body over the next several weeks.   (MALE PATIENTS ONLY)-esp following inguinal hernia repair, it is expected that your scrotum may be slightly swollen or tender. Along with oral NSAIDS medications you can apply ice packs, wear compression shorts, and/or elevate scrotum using a rolled up towel.  Also, a bladder catheter may have been placed during your surgery.  Because of this, it may hurt to urinate for a couple of days or you may pass some blood clots. These issues are expected and will go away after a few days. Please notify surgeon or report to Emergency Dept. if you are unable to urinate or your symptoms worsen.    Pain Control: Prescribed Non-Narcotic Pain Medication  You will be given three prescriptions.  Two of them will be for prescription strength ibuprofen (i.e. Advil) and prescription strength acetaminophen (i.e. Tylenol).  The vast majority of patients will just need these two medications.  One prescription will be for a 'rescue' prescription of an oral narcotic (  oxycodone).  You may fill this if needed.  You will alternate taking the ibuprofen (800mg ) every 6 hours and also the acetaminophen (650mg ) every 6 hours so that you are taking one of those medications every 3 hours.  For example: o 0800 - take ibuprofen 800mg  o 1100 - take acetaminophen 650mg  o 1400 - take ibuprofen 800mg  o 1700 - take acetaminophen 650mg  o Etc.  Continue taking this alternating pattern of ibuprofen and acetaminophen for 3 days  If you cannot take one or the other  of these medications, just take the one you can every 6 hours.  If you are comfortable at night, you don't have to wake up and take a medication.  If you are still uncomfortable after taking either ibuprofen or acetaminophen, try gentle stretching exercise and ice packs (a bag of frozen vegetables works great).  If you are still uncomfortable, you may fill the narcotic prescription of Oxycodone and take as directed.  Once you have completed these prescriptions, your pain level should be low enough to stop taking medications altogether or just use an over the counter medication (ibuprofen or acetaminophen) as needed.   Pain Control: Over the Counter Medications to take as needed:  Colace/Docusate: May be prescribed by your surgeon to prevent constipation caused by the combination of narcotics, effects of anesthesia, and decreased ambulation.  Hold for loose stools or diarrhea. Take 100 mg 1-2 times a day starting tonight.   Fiber: High fiber foods, extra liquids (water 9-13 cups/day) can also assist with constipation. Examples of high fiber foods are fruit, bran. Prune juice and water are also good liquids to drink.  Milk of Magnesia/Miralax:  If constipated despite take the over the counter stool softeners, you may take Milk of Magnesia or Miralax as directed on bottle to assist with constipation.     Pepcid/Famotidine: May be prescribed while taking naproxen (Aleve) or other NSAIDs such as ibuprofen (Motrin/Advil) to prevent stomach upset or Acid-reflux symptoms. Take 1 tablet 1-2 times a day.   **Constipation: The first bowel movement may occur anywhere between 1-5 days after surgery.  As long as you are not nauseated or not having significant abdominal pain this variation is acceptable.  Narcotic pain medications can cause constipation increasing discomfort; early discontinuation will assist with bowel management.If constipated despite taking stool softeners, you may take Milk of Magnesia or Miralax  as directed on the bottle.     **Home medications: You may restart your home medications as directed by your respective Primary Care Physician or Surgeon.  When to notify your Doctor or Healthcare Team:   Sign of Wound Infection   Fever over 100 degrees.  Wound becomes extremely swollen, shows red streaks, warm to the touch, and/or drainage from the incision site or foul-smelling drainage.  Wound edges separate or opens up  Bleeding or bruising   If you have bleeding, apply pressure to the site and hold the pressure firmly for 5 minutes. If the bleeding continues, apply pressure again and call 911. If the bleeding stopped, call your doctor to report it.   Call your doctor or nurse if you have increased bleeding from your site and increased bruising or a lump forms or gets larger under your skin at the site.  Unrelieved Pain    Call your doctor or nurse if your pain gets worse or is not eased 1 hour after taking your pain medicine, or if it is severe and uncontrolled.  Nausea and Vomiting   Call your  doctor or nurse if you have nausea and vomiting that continues more than 24 hours, will not let you keep medicine down and will not let you keep fluids down  Fever, Flu-like symptoms   Fever over 100 degrees and/or chills  Gastrointestinal Bleeding Symptoms    Black tarry bowel movements.  This can be normal after surgery on the stomach, but should resolve in a day or two.    Call 911 if you suddenly have signs of blood loss such as:  Vomiting blood  Fast heart rate  Feeling faint, sweaty, or blacking out  Passing bright red blood from your rectum  Blood Clot Symptoms   Tender, swollen or reddened areas in your calf muscle or thighs.  Numbness or tingling in your lower leg or calf, or at the top of your leg or groin  Skin on your leg looks pale or blue or feels cold to touch  Chest pain or have trouble breathing, lightheadedness, fast heart rate  Sudden Onset of Symptoms    Call 911  if you suddenly have:  Leg weakness and spasm  Loss of bladder or bowel function  Seizure  Confusion, severe headache, dizziness or feeling unsteady, problems talking, difficulty swallowing, and/or numbness or muscle weakness as these could be signs of a stroke.   Follow up Appointment Your follow up appointment should be scheduled 2-3 weeks after your surgery date.  If you have not previously scheduled for a follow-up visit you can be scheduled by contacting [phone].

## 2022-11-28 NOTE — Anesthesia Preprocedure Evaluation (Signed)
Anesthesia Evaluation  Patient identified by MRN, date of birth, ID band Patient awake    Reviewed: Allergy & Precautions, H&P , NPO status , Patient's Chart, lab work & pertinent test results  Airway Mallampati: III  TM Distance: >3 FB Neck ROM: Full    Dental  (+) Teeth Intact, Dental Advisory Given   Pulmonary neg pulmonary ROS   Pulmonary exam normal breath sounds clear to auscultation       Cardiovascular hypertension (156/84 preop, per pt normally 120/80), Pt. 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  negative genitourinary   Musculoskeletal  (+) Arthritis , Osteoarthritis,  Hydrocodone 5mg  once to twice a day   Abdominal   Peds negative pediatric ROS (+)  Hematology negative hematology ROS (+) Hb 12.5   Anesthesia Other Findings   Reproductive/Obstetrics negative OB ROS                             Anesthesia Physical Anesthesia Plan  ASA: 2  Anesthesia Plan: General   Post-op Pain Management: Tylenol PO (pre-op)*   Induction: Intravenous  PONV Risk Score and Plan: 2 and Ondansetron, Dexamethasone and Treatment may vary due to age or medical condition  Airway Management Planned: Oral ETT  Additional Equipment: None  Intra-op Plan:   Post-operative Plan: Extubation in OR  Informed Consent: I have reviewed the patients History and Physical, chart, labs and discussed the procedure including the risks, benefits and alternatives for the proposed anesthesia with the patient or authorized representative who has indicated his/her understanding and acceptance.     Dental advisory given  Plan Discussed with:  CRNA  Anesthesia Plan Comments:        Anesthesia Quick Evaluation

## 2022-11-28 NOTE — Transfer of Care (Signed)
Immediate Anesthesia Transfer of Care Note  Patient: ADEBAYO HOLTER  Procedure(s) Performed: XI ROBOTIC ASSISTED BILATERAL INGUINAL HERNIA (Bilateral)  Patient Location: PACU  Anesthesia Type:General  Level of Consciousness: awake and drowsy  Airway & Oxygen Therapy: Patient Spontanous Breathing and Patient connected to face mask oxygen  Post-op Assessment: Report given to RN and Post -op Vital signs reviewed and stable  Post vital signs: Reviewed and stable  Last Vitals:  Vitals Value Taken Time  BP 112/67 11/28/22 1506  Temp 36.6 C 11/28/22 1506  Pulse 76 11/28/22 1509  Resp 17 11/28/22 1509  SpO2 96 % 11/28/22 1509  Vitals shown include unfiled device data.  Last Pain:  Vitals:   11/28/22 1006  TempSrc:   PainSc: 2       Patients Stated Pain Goal: 0 (11/28/22 1006)  Complications: No notable events documented.

## 2022-11-28 NOTE — Op Note (Signed)
Douglas Richards ([id number])  Operative Report   Date 11/28/2022  PREOPERATIVE DIAGNOSIS:  Bilateral inguinal hernias POSTOPERATIVE DIAGNOSIS: Same  PROCEDURE:  Robotic Bilateral Inguinal Hernia Repair with Mesh (Bard 3D midweight XL on the left, Bard 3D mid-weight L on the right)  Bilateral TAP blocks performed under laparoscopic guidance.   SURGEON: Melody Haver, MD  ANESTHESIA: General Anesthesia    INTRAOPERATIVE FINDINGS: Bilateral indirect inguinal hernias with chronic appearing hernia sac.   IMPLANTS: Implant Name Type Inv. Item Serial No. Manufacturer Lot No. LRB No. Used Action  MESH 3DMAX MID 5X7 RT Valinda Party - [id number] Mesh General MESH 3DMAX MID 5X7 RT Lajuana Carry BARD ACCESS [id number] Left 1 Implanted  MESH 3DMAX MID 4X6 RT LRG - [id number] Mesh General MESH 3DMAX MID 4X6 RT LRG  [employer] INC BARD ACCESS [id number] Right 1 Implanted    ESTIMATED BLOOD LOSS: 20mL  COMPLICATIONS: None  SPECIMENS: None  OPERATIVE INDICATIONS: Pt is a 77 y.o. male who presents with a bilateral inguinal hernia.  The patient desires definitive repair.  The procedure's risks, benefits, and alternatives were explained to the patient.  Risks, including the risks of bleeding, infection, need for mesh removal, and potential for hernia recurrence, were discussed.  The patient agreed to proceed and signed informed consent in front of a witness.   DESCRIPTION OF PROCEDURE: After preoperatively identifying the patient in holding, the patient was brought to the operating room and placed supine on the operating room table.  Both arms were tucked and padded to avoid potential nerve injury.  Sequential Compression Devices were placed bilaterally.  After induction of general anesthesia, the patient was given the appropriate perioperative antibiotics.  The abdomen was prepped and draped in a typical sterile fashion.  A JACHO approved time out, where the name of the patient, the operation, and the intended site  were confirmed.  I began by playing a veress needle into the abdomen in the left upper quadrant at Palmer's point. Following aspiration of air and a positive saline drop test, the abdomen was insufflated to . An 8 mm optical port was placed left of the umbilicus under direct visualization.  A camera was introduced into the abdomen, and a thorough inspection of the abdomen was performed to confirm there was no additional pathology.  Two additional 8 mm robotic ports were placed under direct visualization, one superior to the umbilicus and another right of the umbilicus.  The patient was then placed into 15-degree Trendelenburg position to facilitate examination of the bilateral inguinal spaces.  A thorough inspection of the abdomen was undertaken.  Bilateral inguinal hernias were identified and amenable to robotic repair. I then instilled bupivocaine into the transverse abdominus plane using laparoscopic guidance.    The [employer] robot was brought into the field and docked.  An 8 mm 30-degree scope was placed in the mid-abdominal port.  I began on the left side of the abdomen. The peritoneum was taken down 6 cm superior to the hernia from the anterior abdominal wall, and dissection was taken inferiorly towards the hernia.  Medial to the epigastric vessels, the parietal compartment was dissected and completed to visualize the rectus muscle.  This dissection was carried down to the symphysis pubis and obturator foramen.  The retropubic space was dissected to expose at least 2 cm contralateral to the midline.  Cooper's ligament was exposed and cleared at least 2 cm below the ligament to ensure adequate space for the inferior border of the mesh.  Hesselbach's triangle was cleared, assessing for a direct hernia. There was no evidence of a direct hernia. Any herniated femoral contents were reduced as well.  Lateral to the epigastric vessels, the dissection was carried out into the visceral compartment, continuing  in the true preperitoneal plane.  The indirect hernia sac was carefully reduced and separated from the cord structures with medial retraction and a combination of blunt/sharp dissection and focused cautery.  This dissection continued until the cord structures were parietalized completely, allowing for continuous visualization of the reflected peritoneum with the line originating 2 cm below Cooper's medially and across the psoas muscle in the lateral compartment.  The internal ring was interrogated for a cord lipoma.  Any component of cord lipoma was reduced to the retroperitoneum and seated dorsal to the preperitoneal mesh.  Findings on the left side included a large cord lipoma as well as herniated fat.  Having achieved a complete dissection with a critical view of the entire myopectineal orifice, a piece of Bard 3D XL mid-weight Polypropylene Mesh was introduced into the field.  It was centered at the iliopubic tract, with the medial side crossing the midline and the inferior edge positioned 2 cm below Cooper's ligament.  With complete coverage of the myopectineal orifice, the inferior edge of the peritoneum was posterior and inferior to the mesh.  The lateral aspect of the mesh extended 3-5 cm beyond the lateral edge of the psoas.  Cephalad retraction of the peritoneal flap did not cause lifting of the inferior mesh edge or cord structures.  The mesh was fixated using an interrupted 3-0 Vicryl suture placed to the ipsilateral Coopers ligament.  The peritoneal flap was closed with a running 2-0 v-lock barbed suture.    I then turned my attention to the right side of the abdomen.  I undertook the same series of steps, starting with the creation of a peritoneal flap.  Findings on this side were notable for a large, chronically appearing hernia sac without evidence of bowel involvement as well as a small cord lipoma.  The mesh placed on the left side of the abdomen had an extension that was to the right of  midline, so I chose to use a large mesh on the right side that provided adequate overlap with the two meshes at the midline while also covering all of the lateral spaces.     Hemostasis was assured in the entirety of the abdomen.  There was a hole in the right-sided peritoneal flap that was repaired using a 2-0 vicryl suture.  The two lateral ports were removed under direct visualization.  The abdomen was desufflated under direct visualization.  The port sites were closed and Dermabond was applied.  After confirming twice that the sponge, needle, and instrument counts were correct, the procedure was terminated, the patient was extubated and transferred to the recovery room in stable condition.     DISPOSITION: Stable to PACU  Electronically Signed By: Moise Boring 11/28/2022 3:10 PM

## 2022-11-28 NOTE — H&P (Signed)
Douglas Richards [birth year]  [id number].    HPI:  77 y/o M who presents for elective bilateral inguinal hernia repairs.  He presented with pain on the right side and underwent a CT that demonstrated bilateral fat-containing hernias.  He reports that he is in his usual state of health and denies any recent hospitalizations or changes in medication.   ROS: Review of Systems  Constitutional: Negative.   HENT: Negative.    Eyes: Negative.   Respiratory: Negative.    Cardiovascular: Negative.   Gastrointestinal: Negative.   Musculoskeletal: Negative.   Neurological: Negative.   Endo/Heme/Allergies: Negative.   Psychiatric/Behavioral: Negative.      Family History  Problem Relation Age of Onset   COPD Mother    Stroke Mother    Hypertension Mother    Diabetes Father    COPD Father    Alcohol abuse Father    Stroke Father    Hypertension Father    Alcohol abuse Brother    Arthritis Brother        lupus   Heart disease Brother    Hypertension Other        fam hx   Heart disease Sister    Colon cancer Neg Hx    Colon polyps Neg Hx    Esophageal cancer Neg Hx    Stomach cancer Neg Hx    Rectal cancer Neg Hx     Past Medical History:  Diagnosis Date   Allergic rhinitis    Allergy    Anal fissure    Cataract    right eye    Chronic kidney disease    Stage 3   Diabetes mellitus 02/06/2009   type II   Diverticulosis of colon (without mention of hemorrhage)    GERD (gastroesophageal reflux disease)    Gout, unspecified    Herpes zoster without mention of complication    Hyperlipidemia    Hypertension    Osteoarthrosis, unspecified whether generalized or localized, unspecified site    Other abnormal glucose    Personal history of colonic polyps     Past Surgical History:  Procedure Laterality Date   CATARACT EXTRACTION Bilateral    COLONOSCOPY  01/15/2014   HEMORRHOID SURGERY     LIPOMA EXCISION     from the neck   POLYPECTOMY     rotator cuff surgery  Right    TONSILLECTOMY      Social History:  reports that he has never smoked. He has never used smokeless tobacco. He reports that he does not currently use alcohol. He reports that he does not use drugs.  Allergies:  Allergies  Allergen Reactions   Atorvastatin Other (See Comments)    Muscle/joint aches   Prednisolone Swelling    Red swollen face He had cortisone knee shot and was ok   Sulfonamide Derivatives Nausea Only   Trulicity [Dulaglutide]     ?pancreatitis    Medications Prior to Admission  Medication Sig Dispense Refill   allopurinol (ZYLOPRIM) 100 MG tablet Take 1/2 tablet (50 mg total) by mouth daily. (Patient taking differently: Take 50 mg by mouth every evening.) 90 tablet 1   amLODipine (NORVASC) 5 MG tablet Take 1 tablet (5 mg total) by mouth daily. 90 tablet 1   aspirin EC 325 MG tablet Take 650 mg by mouth every 6 (six) hours as needed (pain.).     Cholecalciferol (VITAMIN D3) 1000 UNITS tablet Take 1,000 Units by mouth every evening.  HYDROcodone-acetaminophen (NORCO/VICODIN) 5-325 MG tablet Take 1 tablet by mouth every 6 (six) hours as needed for severe pain. 120 tablet 0   hydrOXYzine (VISTARIL) 25 MG capsule Take 1 capsule (25 mg total) by mouth every 8 (eight) hours as needed. 60 capsule 1   loratadine (CLARITIN) 10 MG tablet Take 10 mg by mouth in the morning.     metFORMIN (GLUCOPHAGE) 500 MG tablet Take 1 tablet (500 mg total) by mouth 2 (two) times daily with a meal. 180 tablet 3   methocarbamol (ROBAXIN) 500 MG tablet Take 1 tablet (500 mg total) by mouth every 8 (eight) hours as needed. 30 tablet 0   MINERAL OIL PO Take 15 mLs by mouth in the morning.     omeprazole (PRILOSEC) 20 MG capsule Take 1 capsule (20 mg total) by mouth daily. (Patient taking differently: Take 20 mg by mouth every evening.) 90 capsule 3   ondansetron (ZOFRAN-ODT) 4 MG disintegrating tablet Take 1 tablet (4 mg total) by mouth every 8 (eight) hours as needed for nausea or  vomiting. 30 tablet 1   ondansetron (ZOFRAN-ODT) 4 MG disintegrating tablet Take 1 tablet (4 mg total) by mouth every 8 (eight) hours as needed for nausea or vomiting 30 tablet 1   pantoprazole (PROTONIX) 40 MG tablet Take 1 tablet (40 mg total) by mouth 2 (two) times daily. 60 tablet 5   Polyethyl Glycol-Propyl Glycol (LUBRICANT EYE DROPS) 0.4-0.3 % SOLN Place 1-2 drops into both eyes See admin instructions. Instill 1 drop into both eyes scheduled every morning & every 2 hours as needed for dry/irritated eyes.     Psyllium (METAMUCIL PO) Take 3.4 g by mouth in the morning. (Tablespoon)     repaglinide (PRANDIN) 1 MG tablet Take 1 tablet (1 mg total) by mouth 3 (three) times daily before meals. 270 tablet 3   rosuvastatin (CRESTOR) 10 MG tablet Take 1 tablet (10 mg total) by mouth daily. 90 tablet 3   tamsulosin (FLOMAX) 0.4 MG CAPS capsule Take 1 capsule (0.4 mg total) by mouth daily. 90 capsule 3   thiamine (VITAMIN B-1) 100 MG tablet Take 100 mg by mouth daily.     traMADol (ULTRAM) 50 MG tablet Take 1 tablet (50 mg total) by mouth every 6 (six) hours as needed. 30 tablet 0   trandolapril (MAVIK) 4 MG tablet Take 1 tablet (4 mg total) by mouth daily. 90 tablet 1   Blood Glucose Monitoring Suppl (ACCU-CHEK GUIDE) w/Device KIT Use to check blood sugar twice daily. 1 kit 1   clotrimazole-betamethasone (LOTRISONE) cream Apply 1 Application topically daily. 30 g 1   glucose blood (ACCU-CHEK GUIDE) test strip Use as instructed twice daily. 100 each 12   glucose blood (FREESTYLE LITE) test strip USE TO CHECK BLOOD SUGAR TWICE DAILY 100 strip 5   Lancets (FREESTYLE) lancets USE TO CHECK BLOOD SUGAR TWICE DAILY. Annual appt is due w/lab must see provider for future refills 100 each 5   Lancets (ONETOUCH DELICA PLUS LANCET33G) MISC Use as directed to check blood sugar twice daily. 100 each 12    Physical Exam: Blood pressure (!) 156/84, pulse 100, temperature 98.2 F (36.8 C), temperature source Oral,  resp. rate 18, height 5\' 7"  (1.702 m), weight 78.9 kg, SpO2 97%. Gen: male resting in bed, NAD Groin: right-sided bulge  Neuro: moving all extremities  Results for orders placed or performed during the hospital encounter of 11/28/22 (from the past 48 hour(s))  Glucose, capillary  Status: Abnormal   Collection Time: 11/28/22 10:02 AM  Result Value Ref Range   Glucose-Capillary 127 (H) 70 - 99 mg/dL    Comment: Glucose reference range applies only to samples taken after fasting for at least 8 hours.   No results found.  Assessment/Plan 77 y/o M presenting for elective repair of bilateral inguinal hernias  - Will proceed to the OR for the planned operation. We discussed the risks and benefits of surgery, including but not limited to: bleeding, infection, recurrent hernia, mesh complications, groin pain, and damage to surrounding structures.  Written consent was obtained - Tentative plan for discharge from PACU  [facility] 11/28/2022, 11:36 AM Please see Amion for pager number during day hours 7:00am-4:30pm or 7:00am -11:30am on weekends

## 2022-12-03 ENCOUNTER — Other Ambulatory Visit: Payer: Self-pay | Admitting: Internal Medicine

## 2022-12-04 ENCOUNTER — Other Ambulatory Visit (HOSPITAL_COMMUNITY): Payer: Self-pay

## 2022-12-05 ENCOUNTER — Other Ambulatory Visit (HOSPITAL_COMMUNITY): Payer: Self-pay

## 2022-12-05 ENCOUNTER — Other Ambulatory Visit: Payer: Self-pay

## 2022-12-05 MED ORDER — PANTOPRAZOLE SODIUM 40 MG PO TBEC
40.0000 mg | DELAYED_RELEASE_TABLET | Freq: Two times a day (BID) | ORAL | 11 refills | Status: DC
  Filled 2022-12-05 – 2023-01-14 (×2): qty 60, 30d supply, fill #0
  Filled 2023-02-09: qty 60, 30d supply, fill #1
  Filled 2023-02-25: qty 60, 30d supply, fill #2
  Filled 2023-03-11: qty 180, 90d supply, fill #2
  Filled 2023-06-10: qty 180, 90d supply, fill #3

## 2022-12-29 ENCOUNTER — Other Ambulatory Visit (HOSPITAL_COMMUNITY): Payer: Self-pay

## 2023-01-07 ENCOUNTER — Other Ambulatory Visit: Payer: Self-pay | Admitting: Internal Medicine

## 2023-01-07 MED ORDER — TRANDOLAPRIL 4 MG PO TABS
4.0000 mg | ORAL_TABLET | Freq: Every day | ORAL | 1 refills | Status: DC
  Filled 2023-01-07: qty 90, 90d supply, fill #0
  Filled 2023-04-04: qty 90, 90d supply, fill #1

## 2023-01-07 MED ORDER — AMLODIPINE BESYLATE 5 MG PO TABS
5.0000 mg | ORAL_TABLET | Freq: Every day | ORAL | 1 refills | Status: DC
  Filled 2023-01-07: qty 90, 90d supply, fill #0
  Filled 2023-04-04: qty 90, 90d supply, fill #1

## 2023-01-08 ENCOUNTER — Other Ambulatory Visit (HOSPITAL_COMMUNITY): Payer: Self-pay

## 2023-01-08 ENCOUNTER — Other Ambulatory Visit: Payer: Self-pay

## 2023-01-15 ENCOUNTER — Other Ambulatory Visit: Payer: Self-pay | Admitting: Internal Medicine

## 2023-01-15 ENCOUNTER — Other Ambulatory Visit (HOSPITAL_COMMUNITY): Payer: Self-pay

## 2023-01-15 ENCOUNTER — Other Ambulatory Visit: Payer: Self-pay

## 2023-01-15 ENCOUNTER — Other Ambulatory Visit (INDEPENDENT_AMBULATORY_CARE_PROVIDER_SITE_OTHER): Payer: PPO

## 2023-01-15 DIAGNOSIS — E1122 Type 2 diabetes mellitus with diabetic chronic kidney disease: Secondary | ICD-10-CM | POA: Diagnosis not present

## 2023-01-15 DIAGNOSIS — I10 Essential (primary) hypertension: Secondary | ICD-10-CM

## 2023-01-15 DIAGNOSIS — N182 Chronic kidney disease, stage 2 (mild): Secondary | ICD-10-CM | POA: Diagnosis not present

## 2023-01-15 LAB — COMPREHENSIVE METABOLIC PANEL
ALT: 13 U/L (ref 0–53)
AST: 16 U/L (ref 0–37)
Albumin: 4.2 g/dL (ref 3.5–5.2)
Alkaline Phosphatase: 50 U/L (ref 39–117)
BUN: 33 mg/dL — ABNORMAL HIGH (ref 6–23)
CO2: 25 meq/L (ref 19–32)
Calcium: 9.6 mg/dL (ref 8.4–10.5)
Chloride: 101 meq/L (ref 96–112)
Creatinine, Ser: 1.83 mg/dL — ABNORMAL HIGH (ref 0.40–1.50)
GFR: 35.21 mL/min — ABNORMAL LOW (ref >60.00)
Glucose, Bld: 109 mg/dL — ABNORMAL HIGH (ref 70–99)
Potassium: 4.5 meq/L (ref 3.5–5.1)
Sodium: 136 meq/L (ref 135–145)
Total Bilirubin: 0.4 mg/dL (ref 0.2–1.2)
Total Protein: 6.9 g/dL (ref 6.0–8.3)

## 2023-01-15 LAB — HEMOGLOBIN A1C: Hgb A1c MFr Bld: 6.4 % (ref 4.6–6.5)

## 2023-01-15 MED ORDER — REPAGLINIDE 1 MG PO TABS
1.0000 mg | ORAL_TABLET | Freq: Three times a day (TID) | ORAL | 3 refills | Status: DC
  Filled 2023-01-15: qty 270, 90d supply, fill #0
  Filled 2023-04-08: qty 270, 90d supply, fill #1
  Filled 2023-07-01: qty 270, 90d supply, fill #2
  Filled 2023-10-08: qty 270, 90d supply, fill #3

## 2023-01-16 ENCOUNTER — Other Ambulatory Visit: Payer: Self-pay

## 2023-01-17 ENCOUNTER — Ambulatory Visit: Payer: PPO | Admitting: Internal Medicine

## 2023-01-17 ENCOUNTER — Encounter: Payer: Self-pay | Admitting: Internal Medicine

## 2023-01-17 ENCOUNTER — Other Ambulatory Visit (HOSPITAL_COMMUNITY): Payer: Self-pay

## 2023-01-17 VITALS — BP 118/68 | HR 68 | Temp 98.4°F | Ht 67.0 in | Wt 175.2 lb

## 2023-01-17 DIAGNOSIS — Z7984 Long term (current) use of oral hypoglycemic drugs: Secondary | ICD-10-CM | POA: Diagnosis not present

## 2023-01-17 DIAGNOSIS — N183 Chronic kidney disease, stage 3 unspecified: Secondary | ICD-10-CM | POA: Diagnosis not present

## 2023-01-17 DIAGNOSIS — G8929 Other chronic pain: Secondary | ICD-10-CM | POA: Diagnosis not present

## 2023-01-17 DIAGNOSIS — I10 Essential (primary) hypertension: Secondary | ICD-10-CM

## 2023-01-17 DIAGNOSIS — K4090 Unilateral inguinal hernia, without obstruction or gangrene, not specified as recurrent: Secondary | ICD-10-CM

## 2023-01-17 DIAGNOSIS — N182 Chronic kidney disease, stage 2 (mild): Secondary | ICD-10-CM

## 2023-01-17 DIAGNOSIS — E1122 Type 2 diabetes mellitus with diabetic chronic kidney disease: Secondary | ICD-10-CM | POA: Diagnosis not present

## 2023-01-17 DIAGNOSIS — M545 Low back pain, unspecified: Secondary | ICD-10-CM | POA: Diagnosis not present

## 2023-01-17 DIAGNOSIS — E785 Hyperlipidemia, unspecified: Secondary | ICD-10-CM

## 2023-01-17 MED ORDER — HYDROCODONE-ACETAMINOPHEN 5-325 MG PO TABS
1.0000 | ORAL_TABLET | Freq: Four times a day (QID) | ORAL | 0 refills | Status: AC | PRN
  Filled 2023-01-17: qty 120, 30d supply, fill #0

## 2023-01-17 NOTE — Assessment & Plan Note (Signed)
S/p B hernia repair 11/28/22

## 2023-01-17 NOTE — Assessment & Plan Note (Signed)
Continue on amlodipine, [facility]

## 2023-01-17 NOTE — Progress Notes (Signed)
Subjective:  Patient ID: Douglas Richards, male    DOB: Aug 03, 1945  Age: 77 y.o. MRN: [id number]  CC: Medical Management of Chronic Issues (3 Month Follow Up. Recovering well from hernia surgery)   HPI [name] presents for LBP - better, hernia repair 11/28/22, GERD  Outpatient Medications Prior to Visit  Medication Sig Dispense Refill   allopurinol (ZYLOPRIM) 100 MG tablet Take 1/2 tablet (50 mg total) by mouth daily. (Patient taking differently: Take 50 mg by mouth every evening.) 90 tablet 1   amLODipine (NORVASC) 5 MG tablet Take 1 tablet (5 mg total) by mouth daily. 90 tablet 1   Blood Glucose Monitoring Suppl (ACCU-CHEK GUIDE) w/Device KIT Use to check blood sugar twice daily. 1 kit 1   Cholecalciferol (VITAMIN D3) 1000 UNITS tablet Take 1,000 Units by mouth every evening.     clotrimazole-betamethasone (LOTRISONE) cream Apply 1 Application topically daily. 30 g 1   glucose blood (ACCU-CHEK GUIDE) test strip Use as instructed twice daily. 100 each 12   glucose blood (FREESTYLE LITE) test strip USE TO CHECK BLOOD SUGAR TWICE DAILY 100 strip 5   hydrOXYzine (VISTARIL) 25 MG capsule Take 1 capsule (25 mg total) by mouth every 8 (eight) hours as needed. 60 capsule 1   Lancets (FREESTYLE) lancets USE TO CHECK BLOOD SUGAR TWICE DAILY. Annual appt is due w/lab must see provider for future refills 100 each 5   Lancets (ONETOUCH DELICA PLUS LANCET33G) MISC Use as directed to check blood sugar twice daily. 100 each 12   loratadine (CLARITIN) 10 MG tablet Take 10 mg by mouth in the morning.     metFORMIN (GLUCOPHAGE) 500 MG tablet Take 1 tablet (500 mg total) by mouth 2 (two) times daily with a meal. 180 tablet 3   methocarbamol (ROBAXIN) 500 MG tablet Take 1 tablet (500 mg total) by mouth every 8 (eight) hours as needed. 30 tablet 0   MINERAL OIL PO Take 15 mLs by mouth in the morning.     omeprazole (PRILOSEC) 20 MG capsule Take 1 capsule (20 mg total) by mouth daily. (Patient taking  differently: Take 20 mg by mouth every evening.) 90 capsule 3   pantoprazole (PROTONIX) 40 MG tablet Take 1 tablet (40 mg total) by mouth 2 (two) times daily. 60 tablet 11   Polyethyl Glycol-Propyl Glycol (LUBRICANT EYE DROPS) 0.4-0.3 % SOLN Place 1-2 drops into both eyes See admin instructions. Instill 1 drop into both eyes scheduled every morning & every 2 hours as needed for dry/irritated eyes.     repaglinide (PRANDIN) 1 MG tablet Take 1 tablet (1 mg total) by mouth 3 (three) times daily before meals. 270 tablet 3   rosuvastatin (CRESTOR) 10 MG tablet Take 1 tablet (10 mg total) by mouth daily. 90 tablet 3   tamsulosin (FLOMAX) 0.4 MG CAPS capsule Take 1 capsule (0.4 mg total) by mouth daily. 90 capsule 3   thiamine (VITAMIN B-1) 100 MG tablet Take 100 mg by mouth daily.     trandolapril (MAVIK) 4 MG tablet Take 1 tablet (4 mg total) by mouth daily. 90 tablet 1   HYDROcodone-acetaminophen (NORCO/VICODIN) 5-325 MG tablet Take 1 tablet by mouth every 6 (six) hours as needed for severe pain. 120 tablet 0   aspirin EC 325 MG tablet Take 650 mg by mouth every 6 (six) hours as needed (pain.).     ondansetron (ZOFRAN-ODT) 4 MG disintegrating tablet Take 1 tablet (4 mg total) by mouth every 8 (eight) hours  as needed for nausea or vomiting. (Patient not taking: Reported on 01/17/2023) 30 tablet 1   ondansetron (ZOFRAN-ODT) 4 MG disintegrating tablet Take 1 tablet (4 mg total) by mouth every 8 (eight) hours as needed for nausea or vomiting (Patient not taking: Reported on 01/17/2023) 30 tablet 1   traMADol (ULTRAM) 50 MG tablet Take 1 tablet (50 mg total) by mouth every 6 (six) hours as needed. (Patient not taking: Reported on 01/17/2023) 30 tablet 0   Psyllium (METAMUCIL PO) Take 3.4 g by mouth in the morning. (Tablespoon) (Patient not taking: Reported on 01/17/2023)     No facility-administered medications prior to visit.    ROS: Review of Systems  Constitutional:  Negative for appetite change,  fatigue and unexpected weight change.  HENT:  Negative for congestion, nosebleeds, sneezing, sore throat and trouble swallowing.   Eyes:  Negative for itching and visual disturbance.  Respiratory:  Negative for cough.   Cardiovascular:  Negative for chest pain, palpitations and leg swelling.  Gastrointestinal:  Negative for abdominal distention, blood in stool, diarrhea and nausea.  Genitourinary:  Negative for frequency and hematuria.  Musculoskeletal:  Positive for back pain. Negative for gait problem, joint swelling and neck pain.  Skin:  Negative for rash.  Neurological:  Negative for dizziness, tremors, speech difficulty and weakness.  Psychiatric/Behavioral:  Negative for agitation, dysphoric mood and sleep disturbance. The patient is not nervous/anxious.     Objective:  BP 118/68   Pulse 68   Temp 98.4 F (36.9 C) (Oral)   Ht 5\' 7"  (1.702 m)   Wt 175 lb 3.2 oz (79.5 kg)   SpO2 98%   BMI 27.44 kg/m   BP Readings from Last 3 Encounters:  01/17/23 118/68  11/28/22 112/62  11/21/22 (!) 157/77    Wt Readings from Last 3 Encounters:  01/17/23 175 lb 3.2 oz (79.5 kg)  11/28/22 174 lb (78.9 kg)  11/21/22 174 lb 1.6 oz (79 kg)    Physical Exam Constitutional:      General: He is not in acute distress.    Appearance: He is well-developed. He is obese.     Comments: NAD  Eyes:     Conjunctiva/sclera: Conjunctivae normal.     Pupils: Pupils are equal, round, and reactive to light.  Neck:     Thyroid: No thyromegaly.     Vascular: No JVD.  Cardiovascular:     Rate and Rhythm: Normal rate and regular rhythm.     Heart sounds: Normal heart sounds. No murmur heard.    No friction rub. No gallop.  Pulmonary:     Effort: Pulmonary effort is normal. No respiratory distress.     Breath sounds: Normal breath sounds. No wheezing or rales.  Chest:     Chest wall: No tenderness.  Abdominal:     General: Bowel sounds are normal. There is no distension.     Palpations: Abdomen  is soft. There is no mass.     Tenderness: There is no abdominal tenderness. There is no guarding or rebound.  Musculoskeletal:        General: No tenderness. Normal range of motion.     Cervical back: Normal range of motion.  Lymphadenopathy:     Cervical: No cervical adenopathy.  Skin:    General: Skin is warm and dry.     Findings: No rash.  Neurological:     Mental Status: He is alert and oriented to person, place, and time.     Cranial Nerves:  No cranial nerve deficit.     Motor: No abnormal muscle tone.     Coordination: Coordination normal.     Gait: Gait normal.     Deep Tendon Reflexes: Reflexes are normal and symmetric.  Psychiatric:        Behavior: Behavior normal.        Thought Content: Thought content normal.        Judgment: Judgment normal.     Lab Results  Component Value Date   WBC 8.2 11/21/2022   HGB 12.5 (L) 11/21/2022   HCT 39.6 11/21/2022   PLT 166 11/21/2022   GLUCOSE 109 (H) 01/15/2023   CHOL 111 07/05/2022   TRIG 118.0 07/05/2022   HDL 49.70 07/05/2022   LDLDIRECT 105.5 09/10/2013   LDLCALC 38 07/05/2022   ALT 13 01/15/2023   AST 16 01/15/2023   NA 136 01/15/2023   K 4.5 01/15/2023   CL 101 01/15/2023   CREATININE 1.83 (H) 01/15/2023   BUN 33 (H) 01/15/2023   CO2 25 01/15/2023   TSH 1.45 07/05/2022   PSA 0.81 09/17/2015   HGBA1C 6.4 01/15/2023   MICROALBUR 0.4 03/31/2010    No results found.  Assessment & Plan:   Problem List Items Addressed This Visit     DM2 (diabetes mellitus, type 2) (HCC)    On Metformin - pt wants to be back on 500 bid On Prandin Glucometer Rx and supplies      Dyslipidemia - Primary    Crestor      Essential hypertension    Continue on amlodipine, Mavik Pain control      CRI (chronic renal insufficiency), stage 3 (moderate)    Not better.   Avoid NSAIDs Hydrate well F/u w/Nephrology       Low back pain    No shots recently Norco prn  Potential benefits of a long term opioids use as well  as potential risks (i.e. addiction risk, apnea etc) and complications (i.e. Somnolence, constipation and others) were explained to the patient and were aknowledged.       Relevant Medications   HYDROcodone-acetaminophen (NORCO/VICODIN) 5-325 MG tablet   Hernia, inguinal    S/p B hernia repair 11/28/22         Meds ordered this encounter  Medications   HYDROcodone-acetaminophen (NORCO/VICODIN) 5-325 MG tablet    Sig: Take 1 tablet by mouth every 6 (six) hours as needed for severe pain (pain score 7-10).    Dispense:  120 tablet    Refill:  0      Follow-up: Return in about 3 months (around 04/17/2023) for a follow-up visit.  Sonda Primes, MD

## 2023-01-17 NOTE — Assessment & Plan Note (Signed)
Not better.   Avoid NSAIDs Hydrate well F/u w/Nephrology

## 2023-01-17 NOTE — Assessment & Plan Note (Signed)
No shots recently Norco prn  Potential benefits of a long term opioids use as well as potential risks (i.e. addiction risk, apnea etc) and complications (i.e. Somnolence, constipation and others) were explained to the patient and were aknowledged.

## 2023-01-17 NOTE — Assessment & Plan Note (Signed)
-   Crestor 

## 2023-01-17 NOTE — Assessment & Plan Note (Signed)
On Metformin - pt wants to be back on 500 bid On Prandin Glucometer Rx and supplies

## 2023-01-17 NOTE — Addendum Note (Signed)
Addended by: Tresa Garter on: 01/17/2023 03:06 PM   Modules accepted: Orders

## 2023-01-21 ENCOUNTER — Other Ambulatory Visit: Payer: Self-pay | Admitting: Internal Medicine

## 2023-01-22 ENCOUNTER — Other Ambulatory Visit: Payer: Self-pay

## 2023-01-22 ENCOUNTER — Other Ambulatory Visit (HOSPITAL_COMMUNITY): Payer: Self-pay

## 2023-01-22 MED ORDER — CLOTRIMAZOLE-BETAMETHASONE 1-0.05 % EX CREA
1.0000 | TOPICAL_CREAM | Freq: Every day | CUTANEOUS | 1 refills | Status: DC
  Filled 2023-01-22: qty 30, 30d supply, fill #0
  Filled 2023-06-10: qty 30, 30d supply, fill #1

## 2023-02-04 ENCOUNTER — Other Ambulatory Visit (HOSPITAL_COMMUNITY): Payer: Self-pay

## 2023-02-05 ENCOUNTER — Other Ambulatory Visit: Payer: Self-pay

## 2023-02-09 ENCOUNTER — Other Ambulatory Visit (HOSPITAL_COMMUNITY): Payer: Self-pay

## 2023-02-11 ENCOUNTER — Other Ambulatory Visit: Payer: Self-pay | Admitting: Internal Medicine

## 2023-02-12 ENCOUNTER — Other Ambulatory Visit (HOSPITAL_COMMUNITY): Payer: Self-pay

## 2023-02-12 MED ORDER — OMEPRAZOLE 20 MG PO CPDR
20.0000 mg | DELAYED_RELEASE_CAPSULE | Freq: Every day | ORAL | 3 refills | Status: DC
  Filled 2023-02-12: qty 90, 90d supply, fill #0
  Filled 2023-05-06: qty 90, 90d supply, fill #1

## 2023-02-20 DIAGNOSIS — N1831 Chronic kidney disease, stage 3a: Secondary | ICD-10-CM | POA: Diagnosis not present

## 2023-02-25 ENCOUNTER — Other Ambulatory Visit: Payer: Self-pay | Admitting: Internal Medicine

## 2023-02-26 ENCOUNTER — Other Ambulatory Visit: Payer: Self-pay

## 2023-02-26 ENCOUNTER — Other Ambulatory Visit (HOSPITAL_COMMUNITY): Payer: Self-pay

## 2023-02-26 MED ORDER — TAMSULOSIN HCL 0.4 MG PO CAPS
0.4000 mg | ORAL_CAPSULE | Freq: Every day | ORAL | 3 refills | Status: DC
  Filled 2023-02-26: qty 90, 90d supply, fill #0
  Filled 2023-05-20: qty 90, 90d supply, fill #1
  Filled 2023-08-12: qty 90, 90d supply, fill #2
  Filled 2023-11-11: qty 90, 90d supply, fill #3

## 2023-03-01 DIAGNOSIS — D631 Anemia in chronic kidney disease: Secondary | ICD-10-CM | POA: Diagnosis not present

## 2023-03-01 DIAGNOSIS — I129 Hypertensive chronic kidney disease with stage 1 through stage 4 chronic kidney disease, or unspecified chronic kidney disease: Secondary | ICD-10-CM | POA: Diagnosis not present

## 2023-03-01 DIAGNOSIS — E1122 Type 2 diabetes mellitus with diabetic chronic kidney disease: Secondary | ICD-10-CM | POA: Diagnosis not present

## 2023-03-01 DIAGNOSIS — N2581 Secondary hyperparathyroidism of renal origin: Secondary | ICD-10-CM | POA: Diagnosis not present

## 2023-03-01 DIAGNOSIS — N1832 Chronic kidney disease, stage 3b: Secondary | ICD-10-CM | POA: Diagnosis not present

## 2023-03-07 ENCOUNTER — Other Ambulatory Visit: Payer: Self-pay

## 2023-03-07 MED ORDER — GLUCOSE BLOOD VI STRP
ORAL_STRIP | 11 refills | Status: AC
  Filled 2023-03-07: qty 100, 50d supply, fill #0
  Filled 2023-07-19: qty 100, 50d supply, fill #1

## 2023-03-12 ENCOUNTER — Other Ambulatory Visit: Payer: Self-pay

## 2023-03-26 ENCOUNTER — Other Ambulatory Visit: Payer: Self-pay | Admitting: Internal Medicine

## 2023-03-26 ENCOUNTER — Ambulatory Visit (INDEPENDENT_AMBULATORY_CARE_PROVIDER_SITE_OTHER): Payer: PPO

## 2023-03-26 VITALS — Ht 68.0 in | Wt 175.0 lb

## 2023-03-26 DIAGNOSIS — Z0000 Encounter for general adult medical examination without abnormal findings: Secondary | ICD-10-CM

## 2023-03-26 DIAGNOSIS — Z860100 Personal history of colon polyps, unspecified: Secondary | ICD-10-CM | POA: Diagnosis not present

## 2023-03-26 NOTE — Patient Instructions (Addendum)
Douglas Richards , Thank you for taking time to come for your Medicare Wellness Visit. I appreciate your ongoing commitment to your health goals. Please review the following plan we discussed and let me know if I can assist you in the future.   Referrals/Orders/Follow-Ups/Clinician Recommendations: Aim for 30 minutes of exercise or brisk walking, 6-8 glasses of water, and 5 servings of fruits and vegetables each day. Referral placed for repeat Colonoscopy w/Dr Marina Goodell.  This is a list of the screening recommended for you and due dates:  Health Maintenance  Topic Date Due   Yearly kidney health urinalysis for diabetes  04/01/2011   Complete foot exam   05/26/2021   Colon Cancer Screening  08/04/2021   Eye exam for diabetics  07/10/2023   Hemoglobin A1C  07/16/2023   Yearly kidney function blood test for diabetes  01/15/2024   Medicare Annual Wellness Visit  03/25/2024   DTaP/Tdap/Td vaccine (3 - Td or Tdap) 10/21/2029   Pneumonia Vaccine  Completed   Flu Shot  Completed   Hepatitis C Screening  Completed   HPV Vaccine  Aged Out   COVID-19 Vaccine  Discontinued   Zoster (Shingles) Vaccine  Discontinued    Advanced directives: (Copy Requested) Please bring a copy of your health care power of attorney and living will to the office to be added to your chart at your convenience.  Next Medicare Annual Wellness Visit scheduled for next year: Yes - 03/2024   Managing Pain Without Opioids Opioids are strong medicines used to treat moderate to severe pain. For some people, especially those who have long-term (chronic) pain, opioids may not be the best choice for pain management due to: Side effects like nausea, constipation, and sleepiness. The risk of addiction (opioid use disorder). The longer you take opioids, the greater your risk of addiction. Pain that lasts for more than 3 months is called chronic pain. Managing chronic pain usually requires more than one approach and is often provided by a team of  health care providers working together (multidisciplinary approach). Pain management may be done at a pain management center or pain clinic. How to manage pain without the use of opioids Use non-opioid medicines Non-opioid medicines for pain may include: Over-the-counter or prescription non-steroidal anti-inflammatory drugs (NSAIDs). These may be the first medicines used for pain. They work well for muscle and bone pain, and they reduce swelling. Acetaminophen. This over-the-counter medicine may work well for milder pain but not swelling. Antidepressants. These may be used to treat chronic pain. A certain type of antidepressant (tricyclics) is often used. These medicines are given in lower doses for pain than when used for depression. Anticonvulsants. These are usually used to treat seizures but may also reduce nerve (neuropathic) pain. Muscle relaxants. These relieve pain caused by sudden muscle tightening (spasms). You may also use a pain medicine that is applied to the skin as a patch, cream, or gel (topical analgesic), such as a numbing medicine. These may cause fewer side effects than medicines taken by mouth. Do certain therapies as directed Some therapies can help with pain management. They include: Physical therapy. You will do exercises to gain strength and flexibility. A physical therapist may teach you exercises to move and stretch parts of your body that are weak, stiff, or painful. You can learn these exercises at physical therapy visits and practice them at home. Physical therapy may also involve: Massage. Heat wraps or applying heat or cold to affected areas. Electrical signals that interrupt pain signals (  transcutaneous electrical nerve stimulation, TENS). Weak lasers that reduce pain and swelling (low-level laser therapy). Signals from your body that help you learn to regulate pain (biofeedback). Occupational therapy. This helps you to learn ways to function at home and work with  less pain. Recreational therapy. This involves trying new activities or hobbies, such as a physical activity or drawing. Mental health therapy, including: Cognitive behavioral therapy (CBT). This helps you learn coping skills for dealing with pain. Acceptance and commitment therapy (ACT) to change the way you think and react to pain. Relaxation therapies, including muscle relaxation exercises and mindfulness-based stress reduction. Pain management counseling. This may be individual, family, or group counseling.  Receive medical treatments Medical treatments for pain management include: Nerve block injections. These may include a pain blocker and anti-inflammatory medicines. You may have injections: Near the spine to relieve chronic back or neck pain. Into joints to relieve back or joint pain. Into nerve areas that supply a painful area to relieve body pain. Into muscles (trigger point injections) to relieve some painful muscle conditions. A medical device placed near your spine to help block pain signals and relieve nerve pain or chronic back pain (spinal cord stimulation device). Acupuncture. Follow these instructions at home Medicines Take over-the-counter and prescription medicines only as told by your health care provider. If you are taking pain medicine, ask your health care providers about possible side effects to watch out for. Do not drive or use heavy machinery while taking prescription opioid pain medicine. Lifestyle  Do not use drugs or alcohol to reduce pain. If you drink alcohol, limit how much you have to: 0-1 drink a day for women who are not pregnant. 0-2 drinks a day for men. Know how much alcohol is in a drink. In the [place] one drink equals one 12 oz bottle of beer (355 mL), one 5 oz glass of wine (148 mL), or one 1 oz glass of hard liquor (44 mL). Do not use any products that contain nicotine or tobacco. These products include cigarettes, chewing tobacco, and vaping  devices, such as e-cigarettes. If you need help quitting, ask your health care provider. Eat a healthy diet and maintain a healthy weight. Poor diet and excess weight may make pain worse. Eat foods that are high in fiber. These include fresh fruits and vegetables, whole grains, and beans. Limit foods that are high in fat and processed sugars, such as fried and sweet foods. Exercise regularly. Exercise lowers stress and may help relieve pain. Ask your health care provider what activities and exercises are safe for you. If your health care provider approves, join an exercise class that combines movement and stress reduction. Examples include yoga and tai chi. Get enough sleep. Lack of sleep may make pain worse. Lower stress as much as possible. Practice stress reduction techniques as told by your therapist. General instructions Work with all your pain management providers to find the treatments that work best for you. You are an important member of your pain management team. There are many things you can do to reduce pain on your own. Consider joining an online or in-person support group for people who have chronic pain. Keep all follow-up visits. This is important. Where to find more information You can find more information about managing pain without opioids from: American Academy of Pain Medicine: painmed.org Institute for Chronic Pain: instituteforchronicpain.org American Chronic Pain Association: theacpa.org Contact a health care provider if: You have side effects from pain medicine. Your pain gets worse  or does not get better with treatments or home therapy. You are struggling with anxiety or depression. Summary Many types of pain can be managed without opioids. Chronic pain may respond better to pain management without opioids. Pain is best managed when you and a team of health care providers work together. Pain management without opioids may include non-opioid medicines, medical  treatments, physical therapy, mental health therapy, and lifestyle changes. Tell your health care providers if your pain gets worse or is not being managed well enough. This information is not intended to replace advice given to you by your health care provider. Make sure you discuss any questions you have with your health care provider. Document Revised: 05/05/2020 Document Reviewed: 05/05/2020 [employer] Patient Education  2024 [employer].

## 2023-03-26 NOTE — Progress Notes (Signed)
Subjective:   Douglas Richards is a 78 y.o. male who presents for Medicare Annual/Subsequent preventive examination. (Pt of Dr A. Plotnikov)  Visit Complete: Virtual I connected with  Douglas Richards on 03/26/23 by a audio enabled telemedicine application and verified that I am speaking with the correct person using two identifiers.  Patient Location: Home  Provider Location: Office/Clinic  I discussed the limitations of evaluation and management by telemedicine. The patient expressed understanding and agreed to proceed.  Vital Signs: Because this visit was a virtual/telehealth visit, some criteria may be missing or patient reported. Any vitals not documented were not able to be obtained and vitals that have been documented are patient reported. Cardiac Risk Factors include: advanced age (>19men, >69 women);diabetes mellitus;dyslipidemia;male gender;hypertension     Objective:    Today's Vitals   03/26/23 1312  Weight: 175 lb (79.4 kg)  Height: 5\' 8"  (1.727 m)   Body mass index is 26.61 kg/m.     03/26/2023    1:10 PM 11/28/2022   10:10 AM 11/21/2022    1:27 PM 10/26/2022    5:55 PM 10/13/2022    6:20 PM 03/06/2022    3:16 PM 03/01/2021    4:17 PM  Advanced Directives  Does Patient Have a Medical Advance Directive? Yes Yes Yes No No Yes Yes  Type of Estate agent of [place];Living will Living will Living will    Living will;Healthcare Power of Attorney  Does patient want to make changes to medical advance directive?  No - Patient declined No - Patient declined    No - Patient declined  Copy of Healthcare Power of Attorney in Chart? No - copy requested      No - copy requested  Would patient like information on creating a medical advance directive?    No - Patient declined No - Patient declined      Current Medications (verified) Outpatient Encounter Medications as of 03/26/2023  Medication Sig   allopurinol (ZYLOPRIM) 100 MG tablet Take 1/2 tablet (50 mg  total) by mouth daily. (Patient taking differently: Take 50 mg by mouth every evening.)   amLODipine (NORVASC) 5 MG tablet Take 1 tablet (5 mg total) by mouth daily.   Blood Glucose Monitoring Suppl (ACCU-CHEK GUIDE) w/Device KIT Use to check blood sugar twice daily.   Cholecalciferol (VITAMIN D3) 1000 UNITS tablet Take 1,000 Units by mouth every evening.   clotrimazole-betamethasone (LOTRISONE) cream Apply 1 Application topically daily.   glucose blood (FREESTYLE LITE) test strip USE TO CHECK BLOOD SUGAR TWICE DAILY   glucose blood test strip Use as instructed twice daily   HYDROcodone-acetaminophen (NORCO/VICODIN) 5-325 MG tablet Take 1 tablet by mouth every 6 (six) hours as needed for severe pain (pain score 7-10).   hydrOXYzine (VISTARIL) 25 MG capsule Take 1 capsule (25 mg total) by mouth every 8 (eight) hours as needed.   Lancets (FREESTYLE) lancets USE TO CHECK BLOOD SUGAR TWICE DAILY. Annual appt is due w/lab must see provider for future refills   Lancets (ONETOUCH DELICA PLUS LANCET33G) MISC Use as directed to check blood sugar twice daily.   loratadine (CLARITIN) 10 MG tablet Take 10 mg by mouth in the morning.   metFORMIN (GLUCOPHAGE) 500 MG tablet Take 1 tablet (500 mg total) by mouth 2 (two) times daily with a meal.   methocarbamol (ROBAXIN) 500 MG tablet Take 1 tablet (500 mg total) by mouth every 8 (eight) hours as needed.   MINERAL OIL PO Take 15 mLs  by mouth in the morning.   omeprazole (PRILOSEC) 20 MG capsule Take 1 capsule (20 mg total) by mouth daily.   ondansetron (ZOFRAN-ODT) 4 MG disintegrating tablet Take 1 tablet (4 mg total) by mouth every 8 (eight) hours as needed for nausea or vomiting.   ondansetron (ZOFRAN-ODT) 4 MG disintegrating tablet Take 1 tablet (4 mg total) by mouth every 8 (eight) hours as needed for nausea or vomiting   pantoprazole (PROTONIX) 40 MG tablet Take 1 tablet (40 mg total) by mouth 2 (two) times daily.   Polyethyl Glycol-Propyl Glycol (LUBRICANT  EYE DROPS) 0.4-0.3 % SOLN Place 1-2 drops into both eyes See admin instructions. Instill 1 drop into both eyes scheduled every morning & every 2 hours as needed for dry/irritated eyes.   repaglinide (PRANDIN) 1 MG tablet Take 1 tablet (1 mg total) by mouth 3 (three) times daily before meals.   rosuvastatin (CRESTOR) 10 MG tablet Take 1 tablet (10 mg total) by mouth daily.   tamsulosin (FLOMAX) 0.4 MG CAPS capsule Take 1 capsule (0.4 mg total) by mouth daily.   thiamine (VITAMIN B-1) 100 MG tablet Take 100 mg by mouth daily.   traMADol (ULTRAM) 50 MG tablet Take 1 tablet (50 mg total) by mouth every 6 (six) hours as needed.   trandolapril (MAVIK) 4 MG tablet Take 1 tablet (4 mg total) by mouth daily.   aspirin EC 325 MG tablet Take 650 mg by mouth every 6 (six) hours as needed (pain.).   No facility-administered encounter medications on file as of 03/26/2023.    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   Diverticulosis of colon (without mention of hemorrhage)    GERD (gastroesophageal reflux disease)    Gout, unspecified    Herpes zoster without mention of complication    Hyperlipidemia    Hypertension    Osteoarthrosis, unspecified whether generalized or localized, unspecified site    Other abnormal glucose    Personal history of colonic polyps    Past Surgical History:  Procedure Laterality Date   CATARACT EXTRACTION Bilateral    COLONOSCOPY  01/15/2014   HEMORRHOID SURGERY     HERNIA REPAIR N/A 10/29/2022   Dr Venetia Maxon - [facility]   LIPOMA EXCISION     from the neck   POLYPECTOMY     rotator cuff surgery Right    TONSILLECTOMY     Family History  Problem Relation Age of Onset   COPD Mother    Stroke  Mother    Hypertension Mother    Diabetes Father    COPD Father    Alcohol abuse Father    Stroke Father    Hypertension Father    Alcohol abuse Brother    Arthritis Brother        lupus   Heart disease Brother    Hypertension Other        fam hx   Heart disease Sister    Colon cancer Neg Hx    Colon polyps Neg Hx    Esophageal cancer Neg Hx    Stomach cancer Neg Hx    Rectal cancer Neg Hx    Social History   Socioeconomic History  Marital status: Married    Spouse name: Not on file   Number of children: 1   Years of education: Not on file   Highest education level: Not on file  Occupational History   Occupation: retired  Tobacco Use   Smoking status: Never   Smokeless tobacco: Never  Vaping Use   Vaping status: Never Used  Substance and Sexual Activity   Alcohol use: Not Currently    Alcohol/week: 0.0 standard drinks of alcohol   Drug use: No   Sexual activity: Yes  Other Topics Concern   Not on file  Social History Narrative   Regular exercise- no. 1biological child, 3 adopted children   Social Drivers of Corporate investment banker Strain: Low Risk  (03/26/2023)   Overall Financial Resource Strain (CARDIA)    Difficulty of Paying Living Expenses: Not hard at all  Food Insecurity: No Food Insecurity (03/26/2023)   Hunger Vital Sign    Worried About Running Out of Food in the Last Year: Never true    Ran Out of Food in the Last Year: Never true  Transportation Needs: No Transportation Needs (03/26/2023)   PRAPARE - Administrator, Civil Service (Medical): No    Lack of Transportation (Non-Medical): No  Physical Activity: Insufficiently Active (03/26/2023)   Exercise Vital Sign    Days of Exercise per Week: 3 days    Minutes of Exercise per Session: 20 min  Stress: No Stress Concern Present (03/26/2023)   [employer] of Occupational Health - Occupational Stress Questionnaire    Feeling of Stress : Not at all  Social Connections: Moderately  Isolated (03/26/2023)   Social Connection and Isolation Panel [NHANES]    Frequency of Communication with Friends and Family: Twice a week    Frequency of Social Gatherings with Friends and Family: Once a week    Attends Religious Services: Never    Database administrator or Organizations: No    Attends Engineer, structural: Never    Marital Status: Married    Tobacco Counseling Counseling given: Not Answered   Clinical Intake:  Pre-visit preparation completed: Yes  Pain : No/denies pain     BMI - recorded: 175 Nutritional Status: BMI 25 -29 Overweight Nutritional Risks: None Diabetes: Yes Did pt. bring in CBG monitor from home?: Yes (fasting blood sugar on 03/25/23 - 101) Glucose Meter Downloaded?: No  How often do you need to have someone help you when you read instructions, pamphlets, or other written materials from your doctor or pharmacy?: 1 - Never  Interpreter Needed?: No  Information entered by :: Hassell Halim, CMA   Activities of Daily Living    03/26/2023    1:17 PM 11/28/2022    9:56 AM  In your present state of health, do you have any difficulty performing the following activities:  Hearing? 1 0  Comment ringing - no audiology referral needed per pt   Vision? 0 0  Difficulty concentrating or making decisions? 0 0  Walking or climbing stairs? 0   Dressing or bathing? 0   Doing errands, shopping? 0   Preparing Food and eating ? N   Using the Toilet? N   In the past six months, have you accidently leaked urine? Y   Comment slight urgency - no referral needed for Urologist   Do you have problems with loss of bowel control? N   Managing your Medications? N   Managing your Finances? N   Housekeeping or  managing your Housekeeping? N     Patient Care Team: Plotnikov, Georgina Quint, MD as PCP - General Mia Creek, MD as Consulting Physician (Ophthalmology) Barnett Abu, MD as Consulting Physician (Neurosurgery)  Indicate any recent Medical  Services you may have received from other than [facility] providers in the past year (date may be approximate).     Assessment:   This is a routine wellness examination for Fischer.  Hearing/Vision screen Hearing Screening - Comments:: Denies hearing difficulties   Vision Screening - Comments:: Wears rx glasses - up to date with routine eye exams with  Dr Darel Hong   Goals Addressed               This Visit's Progress     Weight (lb) < 200 lb (90.7 kg) (pt-stated)   175 lb (79.4 kg)     Patient stated that he'd like to lose weight (7-8lbs).       Depression Screen    03/26/2023    1:22 PM 11/09/2022    2:11 PM 11/01/2022    9:06 AM 10/24/2022    3:11 PM 10/17/2022    1:51 PM 07/13/2022    2:04 PM 07/05/2022    1:55 PM  PHQ 2/9 Scores  PHQ - 2 Score 0 0 0 1 0 0 0  PHQ- 9 Score  2 2 3        Fall Risk    03/26/2023    1:23 PM 01/17/2023    2:22 PM 11/09/2022    2:11 PM 11/01/2022    9:06 AM 10/24/2022    3:10 PM  Fall Risk   Falls in the past year? 1 0 1 1 1   Number falls in past yr: 1 0 1 0 0  Injury with Fall? 0 0 0 0 0  Risk for fall due to : Impaired balance/gait No Fall Risks History of fall(s) No Fall Risks;Impaired balance/gait History of fall(s)  Follow up Falls evaluation completed;Falls prevention discussed Falls evaluation completed Falls evaluation completed Falls evaluation completed Falls evaluation completed    MEDICARE RISK AT HOME: Medicare Risk at Home Any stairs in or around the home?: Yes If so, are there any without handrails?: No Home free of loose throw rugs in walkways, pet beds, electrical cords, etc?: Yes Adequate lighting in your home to reduce risk of falls?: Yes Life alert?: No Use of a cane, walker or w/c?: No Grab bars in the bathroom?: Yes Shower chair or bench in shower?: Yes Elevated toilet seat or a handicapped toilet?: Yes  TIMED UP AND GO:  Was the test performed?  No    Cognitive Function:    11/14/2016    3:26 PM 04/28/2015    1:58  PM  MMSE - Mini Mental State Exam  Not completed:  --  Orientation to time 5   Orientation to Place 5   Registration 3   Attention/ Calculation 5   Recall 3   Language- name 2 objects 2   Language- repeat 1   Language- follow 3 step command 3   Language- read & follow direction 1   Write a sentence 1   Copy design 1   Total score 30         03/26/2023    1:24 PM 03/06/2022    3:18 PM  6CIT Screen  What Year? 0 points 0 points  What month? 0 points 0 points  What time? 0 points 0 points  Count back from 20 0 points 0 points  Months in reverse 0 points 0 points  Repeat phrase 0 points 0 points  Total Score 0 points 0 points    Immunizations Immunization History  Administered Date(s) Administered   Fluad Quad(high Dose 65+) 10/18/2018, 10/22/2019, 11/25/2020, 12/08/2021   Fluad Trivalent(High Dose 65+) 10/17/2022   Influenza Whole 11/28/2005, 11/27/2008   Influenza, High Dose Seasonal PF 11/09/2015, 09/29/2016, 11/29/2017   Influenza,inj,Quad PF,6+ Mos 10/28/2013, 10/21/2014   Influenza-Unspecified 01/06/2013   PFIZER(Purple Top)SARS-COV-2 Vaccination 03/15/2019, 04/05/2019, 11/06/2019, 06/24/2020   Pneumococcal Conjugate-13 01/13/2014   Pneumococcal Polysaccharide-23 06/07/2011   Td 07/23/2009   Tdap 10/22/2019    TDAP status: Up to date - 10/22/2019  Flu Vaccine status: Up to date 10/17/2022  Pneumococcal vaccine status: Up to date - 01/13/2014  Covid-19 vaccine status: Information provided on how to obtain vaccines.   Qualifies for Shingles Vaccine? Yes   Zostavax completed No   Shingrix Completed?: No.    Education has been provided regarding the importance of this vaccine. Patient has been advised to call insurance company to determine out of pocket expense if they have not yet received this vaccine. Advised may also receive vaccine at local pharmacy or Health Dept. Verbalized acceptance and understanding.  Screening Tests Health Maintenance  Topic Date Due    Diabetic kidney evaluation - Urine ACR  04/01/2011   FOOT EXAM  05/26/2021   Colonoscopy  08/04/2021   OPHTHALMOLOGY EXAM  07/10/2023   HEMOGLOBIN A1C  07/16/2023   Diabetic kidney evaluation - eGFR measurement  01/15/2024   Medicare Annual Wellness (AWV)  03/25/2024   DTaP/Tdap/Td (3 - Td or Tdap) 10/21/2029   Pneumonia Vaccine 62+ Years old  Completed   INFLUENZA VACCINE  Completed   Hepatitis C Screening  Completed   HPV VACCINES  Aged Out   COVID-19 Vaccine  Discontinued   Zoster Vaccines- Shingrix  Discontinued    Health Maintenance  Health Maintenance Due  Topic Date Due   Diabetic kidney evaluation - Urine ACR  04/01/2011   FOOT EXAM  05/26/2021   Colonoscopy  08/04/2021    Colorectal cancer screening: Type of screening: Colonoscopy. Completed 08/04/2020. Repeat every 1 years   Additional Screening:  Hepatitis C Screening: does qualify; Completed 09/17/2015  Vision Screening: Recommended annual ophthalmology exams for early detection of glaucoma and other disorders of the eye. Is the patient up to date with their annual eye exam?  Yes  Who is the provider or what is the name of the office in which the patient attends annual eye exams? Dr Darel Hong - due in 09/2023 If pt is not established with a provider, would they like to be referred to a provider to establish care? No .   Dental Screening: Recommended annual dental exams for proper oral hygiene  Diabetic Foot Exam: Diabetic Foot Exam: Completed 05/26/20 - Due 2025  Community Resource Referral / Chronic Care Management: CRR required this visit?  No   CCM required this visit?  No     Plan:     I have personally reviewed and noted the following in the patient's chart:   Medical and social history Use of alcohol, tobacco or illicit drugs  Current medications and supplements including opioid prescriptions. Patient is currently taking opioid prescriptions. Information provided to patient regarding non-opioid  alternatives. Patient advised to discuss non-opioid treatment plan with their provider. Functional ability and status Nutritional status Physical activity Advanced directives List of other physicians Hospitalizations, surgeries, and ER visits in previous 12 months Vitals Screenings to include  cognitive, depression, and falls Referrals and appointments  In addition, I have reviewed and discussed with patient certain preventive protocols, quality metrics, and best practice recommendations. A written personalized care plan for preventive services as well as general preventive health recommendations were provided to patient.     Darreld Mclean, CMA   03/26/2023   After Visit Summary: (MyChart) Due to this being a telephonic visit, the after visit summary with patients personalized plan was offered to patient via MyChart   Nurse Notes: referral placed for repeat Colonoscopy w/Dr Marina Goodell.

## 2023-03-27 ENCOUNTER — Other Ambulatory Visit: Payer: Self-pay

## 2023-03-27 DIAGNOSIS — Z1211 Encounter for screening for malignant neoplasm of colon: Secondary | ICD-10-CM

## 2023-03-27 NOTE — Progress Notes (Addendum)
Orders only - pt referred to GI for Colonoscopy

## 2023-04-02 ENCOUNTER — Other Ambulatory Visit (HOSPITAL_COMMUNITY): Payer: Self-pay

## 2023-04-02 ENCOUNTER — Other Ambulatory Visit: Payer: Self-pay

## 2023-04-02 MED ORDER — METHOCARBAMOL 500 MG PO TABS
500.0000 mg | ORAL_TABLET | Freq: Three times a day (TID) | ORAL | 0 refills | Status: DC | PRN
  Filled 2023-04-02: qty 30, 10d supply, fill #0

## 2023-04-04 ENCOUNTER — Other Ambulatory Visit: Payer: Self-pay

## 2023-04-05 ENCOUNTER — Other Ambulatory Visit (HOSPITAL_BASED_OUTPATIENT_CLINIC_OR_DEPARTMENT_OTHER): Payer: Self-pay

## 2023-04-09 ENCOUNTER — Other Ambulatory Visit (HOSPITAL_COMMUNITY): Payer: Self-pay

## 2023-04-09 ENCOUNTER — Other Ambulatory Visit: Payer: Self-pay

## 2023-04-11 ENCOUNTER — Other Ambulatory Visit

## 2023-04-11 DIAGNOSIS — N183 Chronic kidney disease, stage 3 unspecified: Secondary | ICD-10-CM

## 2023-04-11 DIAGNOSIS — E1122 Type 2 diabetes mellitus with diabetic chronic kidney disease: Secondary | ICD-10-CM

## 2023-04-11 DIAGNOSIS — N182 Chronic kidney disease, stage 2 (mild): Secondary | ICD-10-CM

## 2023-04-11 DIAGNOSIS — K4090 Unilateral inguinal hernia, without obstruction or gangrene, not specified as recurrent: Secondary | ICD-10-CM

## 2023-04-11 DIAGNOSIS — E785 Hyperlipidemia, unspecified: Secondary | ICD-10-CM

## 2023-04-11 LAB — CBC WITH DIFFERENTIAL/PLATELET
Basophils Absolute: 0.1 10*3/uL (ref 0.0–0.1)
Basophils Relative: 0.8 % (ref 0.0–3.0)
Eosinophils Absolute: 0.2 10*3/uL (ref 0.0–0.7)
Eosinophils Relative: 3 % (ref 0.0–5.0)
HCT: 40.4 % (ref 39.0–52.0)
Hemoglobin: 13.3 g/dL (ref 13.0–17.0)
Lymphocytes Relative: 24.8 % (ref 12.0–46.0)
Lymphs Abs: 1.8 10*3/uL (ref 0.7–4.0)
MCHC: 32.9 g/dL (ref 30.0–36.0)
MCV: 93.6 fl (ref 78.0–100.0)
Monocytes Absolute: 0.6 10*3/uL (ref 0.1–1.0)
Monocytes Relative: 8.3 % (ref 3.0–12.0)
Neutro Abs: 4.6 10*3/uL (ref 1.4–7.7)
Neutrophils Relative %: 63.1 % (ref 43.0–77.0)
Platelets: 162 10*3/uL (ref 150.0–400.0)
RBC: 4.32 Mil/uL (ref 4.22–5.81)
RDW: 12.9 % (ref 11.5–15.5)
WBC: 7.3 10*3/uL (ref 4.0–10.5)

## 2023-04-11 LAB — COMPREHENSIVE METABOLIC PANEL
ALT: 16 U/L (ref 0–53)
AST: 18 U/L (ref 0–37)
Albumin: 4.2 g/dL (ref 3.5–5.2)
Alkaline Phosphatase: 46 U/L (ref 39–117)
BUN: 27 mg/dL — ABNORMAL HIGH (ref 6–23)
CO2: 27 meq/L (ref 19–32)
Calcium: 9.8 mg/dL (ref 8.4–10.5)
Chloride: 100 meq/L (ref 96–112)
Creatinine, Ser: 1.89 mg/dL — ABNORMAL HIGH (ref 0.40–1.50)
GFR: 33.81 mL/min — ABNORMAL LOW (ref >60.00)
Glucose, Bld: 105 mg/dL — ABNORMAL HIGH (ref 70–99)
Potassium: 5.3 meq/L — ABNORMAL HIGH (ref 3.5–5.1)
Sodium: 135 meq/L (ref 135–145)
Total Bilirubin: 0.4 mg/dL (ref 0.2–1.2)
Total Protein: 6.6 g/dL (ref 6.0–8.3)

## 2023-04-11 LAB — URINALYSIS, ROUTINE W REFLEX MICROSCOPIC
Bilirubin Urine: NEGATIVE
Hgb urine dipstick: NEGATIVE
Ketones, ur: NEGATIVE
Nitrite: NEGATIVE
Specific Gravity, Urine: 1.015 (ref 1.000–1.030)
Total Protein, Urine: NEGATIVE
Urine Glucose: NEGATIVE
Urobilinogen, UA: 0.2 (ref 0.0–1.0)
pH: 6 (ref 5.0–8.0)

## 2023-04-11 LAB — TSH: TSH: 3.85 u[IU]/mL (ref 0.35–5.50)

## 2023-04-11 LAB — LIPID PANEL
Cholesterol: 127 mg/dL (ref 0–200)
HDL: 61.7 mg/dL (ref >39.00)
LDL Cholesterol: 46 mg/dL (ref 0–99)
NonHDL: 65.45
Total CHOL/HDL Ratio: 2
Triglycerides: 98 mg/dL (ref 0.0–149.0)
VLDL: 19.6 mg/dL (ref 0.0–40.0)

## 2023-04-11 LAB — HEMOGLOBIN A1C: Hgb A1c MFr Bld: 7.5 % — ABNORMAL HIGH (ref 4.6–6.5)

## 2023-04-11 LAB — PSA: PSA: 0.89 ng/mL (ref 0.10–4.00)

## 2023-04-16 ENCOUNTER — Other Ambulatory Visit (HOSPITAL_COMMUNITY): Payer: Self-pay

## 2023-04-17 ENCOUNTER — Encounter: Payer: Self-pay | Admitting: Internal Medicine

## 2023-04-17 ENCOUNTER — Ambulatory Visit (INDEPENDENT_AMBULATORY_CARE_PROVIDER_SITE_OTHER): Payer: PPO | Admitting: Internal Medicine

## 2023-04-17 VITALS — BP 118/70 | HR 88 | Temp 98.5°F | Ht 68.0 in | Wt 179.0 lb

## 2023-04-17 DIAGNOSIS — I10 Essential (primary) hypertension: Secondary | ICD-10-CM

## 2023-04-17 DIAGNOSIS — N183 Chronic kidney disease, stage 3 unspecified: Secondary | ICD-10-CM | POA: Diagnosis not present

## 2023-04-17 DIAGNOSIS — E875 Hyperkalemia: Secondary | ICD-10-CM | POA: Diagnosis not present

## 2023-04-17 DIAGNOSIS — N182 Chronic kidney disease, stage 2 (mild): Secondary | ICD-10-CM | POA: Diagnosis not present

## 2023-04-17 DIAGNOSIS — E1122 Type 2 diabetes mellitus with diabetic chronic kidney disease: Secondary | ICD-10-CM | POA: Diagnosis not present

## 2023-04-17 NOTE — Assessment & Plan Note (Signed)
 Continue on amlodipine, Mavik

## 2023-04-17 NOTE — Assessment & Plan Note (Signed)
 On Mavik Avoid NSAIDs F/u w/Nephrology

## 2023-04-17 NOTE — Assessment & Plan Note (Signed)
Worse. Improve diet.

## 2023-04-17 NOTE — Progress Notes (Signed)
 Subjective:  Patient ID: Douglas Richards, male    DOB: 1945/08/28  Age: 78 y.o. MRN: [id number]  CC: Medical Management of Chronic Issues (3 mnth f/u)   HPI Douglas Richards presents for LBP - much better after hernia surgery?! F/u HTN, hernia, DM  Outpatient Medications Prior to Visit  Medication Sig Dispense Refill   allopurinol (ZYLOPRIM) 100 MG tablet Take 1/2 tablet (50 mg total) by mouth daily. (Patient taking differently: Take 50 mg by mouth every evening.) 90 tablet 1   amLODipine (NORVASC) 5 MG tablet Take 1 tablet (5 mg total) by mouth daily. 90 tablet 1   aspirin EC 325 MG tablet Take 650 mg by mouth every 6 (six) hours as needed (pain.).     Blood Glucose Monitoring Suppl (ACCU-CHEK GUIDE) w/Device KIT Use to check blood sugar twice daily. 1 kit 1   Cholecalciferol (VITAMIN D3) 1000 UNITS tablet Take 1,000 Units by mouth every evening.     clotrimazole-betamethasone (LOTRISONE) cream Apply 1 Application topically daily. 30 g 1   glucose blood (FREESTYLE LITE) test strip USE TO CHECK BLOOD SUGAR TWICE DAILY 100 strip 5   glucose blood test strip Use as instructed twice daily 100 each 11   HYDROcodone-acetaminophen (NORCO/VICODIN) 5-325 MG tablet Take 1 tablet by mouth every 6 (six) hours as needed for severe pain (pain score 7-10). 120 tablet 0   hydrOXYzine (VISTARIL) 25 MG capsule Take 1 capsule (25 mg total) by mouth every 8 (eight) hours as needed. 60 capsule 1   Lancets (FREESTYLE) lancets USE TO CHECK BLOOD SUGAR TWICE DAILY. Annual appt is due w/lab must see provider for future refills 100 each 5   Lancets (ONETOUCH DELICA PLUS LANCET33G) MISC Use as directed to check blood sugar twice daily. 100 each 12   loratadine (CLARITIN) 10 MG tablet Take 10 mg by mouth in the morning.     metFORMIN (GLUCOPHAGE) 500 MG tablet Take 1 tablet (500 mg total) by mouth 2 (two) times daily with a meal. 180 tablet 3   methocarbamol (ROBAXIN) 500 MG tablet Take 1 tablet (500 mg total) by  mouth every 8 (eight) hours as needed. 30 tablet 0   MINERAL OIL PO Take 15 mLs by mouth in the morning.     omeprazole (PRILOSEC) 20 MG capsule Take 1 capsule (20 mg total) by mouth daily. 90 capsule 3   ondansetron (ZOFRAN-ODT) 4 MG disintegrating tablet Take 1 tablet (4 mg total) by mouth every 8 (eight) hours as needed for nausea or vomiting. 30 tablet 1   ondansetron (ZOFRAN-ODT) 4 MG disintegrating tablet Take 1 tablet (4 mg total) by mouth every 8 (eight) hours as needed for nausea or vomiting 30 tablet 1   pantoprazole (PROTONIX) 40 MG tablet Take 1 tablet (40 mg total) by mouth 2 (two) times daily. 60 tablet 11   Polyethyl Glycol-Propyl Glycol (LUBRICANT EYE DROPS) 0.4-0.3 % SOLN Place 1-2 drops into both eyes See admin instructions. Instill 1 drop into both eyes scheduled every morning & every 2 hours as needed for dry/irritated eyes.     repaglinide (PRANDIN) 1 MG tablet Take 1 tablet (1 mg total) by mouth 3 (three) times daily before meals. 270 tablet 3   rosuvastatin (CRESTOR) 10 MG tablet Take 1 tablet (10 mg total) by mouth daily. 90 tablet 3   tamsulosin (FLOMAX) 0.4 MG CAPS capsule Take 1 capsule (0.4 mg total) by mouth daily. 90 capsule 3   thiamine (VITAMIN B-1) 100 MG tablet  Take 100 mg by mouth daily.     traMADol (ULTRAM) 50 MG tablet Take 1 tablet (50 mg total) by mouth every 6 (six) hours as needed. 30 tablet 0   trandolapril (MAVIK) 4 MG tablet Take 1 tablet (4 mg total) by mouth daily. 90 tablet 1   No facility-administered medications prior to visit.    ROS: Review of Systems  Constitutional:  Negative for appetite change, fatigue and unexpected weight change.  HENT:  Negative for congestion, nosebleeds, sneezing, sore throat and trouble swallowing.   Eyes:  Negative for itching and visual disturbance.  Respiratory:  Negative for cough.   Cardiovascular:  Negative for chest pain, palpitations and leg swelling.  Gastrointestinal:  Negative for abdominal distention,  blood in stool, diarrhea and nausea.  Genitourinary:  Negative for frequency and hematuria.  Musculoskeletal:  Negative for back pain, gait problem, joint swelling and neck pain.  Skin:  Negative for rash.  Neurological:  Negative for dizziness, tremors, speech difficulty and weakness.  Psychiatric/Behavioral:  Negative for agitation, dysphoric mood, sleep disturbance and suicidal ideas. The patient is not nervous/anxious.     Objective:  BP 118/70   Pulse 88   Temp 98.5 F (36.9 C) (Oral)   Ht 5\' 8"  (1.727 m)   Wt 179 lb (81.2 kg)   SpO2 98%   BMI 27.22 kg/m   BP Readings from Last 3 Encounters:  04/17/23 118/70  01/17/23 118/68  11/28/22 112/62    Wt Readings from Last 3 Encounters:  04/17/23 179 lb (81.2 kg)  03/26/23 175 lb (79.4 kg)  01/17/23 175 lb 3.2 oz (79.5 kg)    Physical Exam Constitutional:      General: He is not in acute distress.    Appearance: He is well-developed. He is obese.     Comments: NAD  Eyes:     Conjunctiva/sclera: Conjunctivae normal.     Pupils: Pupils are equal, round, and reactive to light.  Neck:     Thyroid: No thyromegaly.     Vascular: No JVD.  Cardiovascular:     Rate and Rhythm: Normal rate and regular rhythm.     Heart sounds: Normal heart sounds. No murmur heard.    No friction rub. No gallop.  Pulmonary:     Effort: Pulmonary effort is normal. No respiratory distress.     Breath sounds: Normal breath sounds. No wheezing or rales.  Chest:     Chest wall: No tenderness.  Abdominal:     General: Bowel sounds are normal. There is no distension.     Palpations: Abdomen is soft. There is no mass.     Tenderness: There is no abdominal tenderness. There is no guarding or rebound.  Musculoskeletal:        General: No tenderness. Normal range of motion.     Cervical back: Normal range of motion.     Right lower leg: No edema.     Left lower leg: No edema.  Lymphadenopathy:     Cervical: No cervical adenopathy.  Skin:     General: Skin is warm and dry.     Findings: No rash.  Neurological:     Mental Status: He is alert and oriented to person, place, and time.     Cranial Nerves: No cranial nerve deficit.     Motor: No abnormal muscle tone.     Coordination: Coordination normal.     Gait: Gait normal.     Deep Tendon Reflexes: Reflexes are normal and  symmetric.  Psychiatric:        Behavior: Behavior normal.        Thought Content: Thought content normal.        Judgment: Judgment normal.     Lab Results  Component Value Date   WBC 7.3 04/11/2023   HGB 13.3 04/11/2023   HCT 40.4 04/11/2023   PLT 162.0 04/11/2023   GLUCOSE 105 (H) 04/11/2023   CHOL 127 04/11/2023   TRIG 98.0 04/11/2023   HDL 61.70 04/11/2023   LDLDIRECT 105.5 09/10/2013   LDLCALC 46 04/11/2023   ALT 16 04/11/2023   AST 18 04/11/2023   NA 135 04/11/2023   K 5.3 No hemolysis seen (H) 04/11/2023   CL 100 04/11/2023   CREATININE 1.89 (H) 04/11/2023   BUN 27 (H) 04/11/2023   CO2 27 04/11/2023   TSH 3.85 04/11/2023   PSA 0.89 04/11/2023   HGBA1C 7.5 (H) 04/11/2023   MICROALBUR 0.4 03/31/2010    No results found.  Assessment & Plan:   Problem List Items Addressed This Visit     CRI (chronic renal insufficiency), stage 3 (moderate) - Primary   On Mavik Avoid NSAIDs F/u w/Nephrology       DM2 (diabetes mellitus, type 2) (HCC)   Worse Improve diet      Relevant Orders   Comprehensive metabolic panel   Hemoglobin A1c   Essential hypertension   Continue on amlodipine, Mavik       Hyperkalemia   Possibly lab error Hydrate well prior to tests May need to d/c [facility]      Relevant Orders   Comprehensive metabolic panel   Hemoglobin A1c      No orders of the defined types were placed in this encounter.     Follow-up: Return in about 3 months (around 07/18/2023) for a follow-up visit.  Sonda Primes, MD

## 2023-04-17 NOTE — Assessment & Plan Note (Signed)
 Possibly lab error Hydrate well prior to tests May need to d/c [facility]

## 2023-04-29 ENCOUNTER — Other Ambulatory Visit: Payer: Self-pay | Admitting: Internal Medicine

## 2023-04-29 MED ORDER — METHOCARBAMOL 500 MG PO TABS
500.0000 mg | ORAL_TABLET | Freq: Three times a day (TID) | ORAL | 1 refills | Status: DC | PRN
  Filled 2023-04-29: qty 30, 10d supply, fill #0

## 2023-04-30 ENCOUNTER — Other Ambulatory Visit: Payer: Self-pay

## 2023-04-30 ENCOUNTER — Other Ambulatory Visit (HOSPITAL_COMMUNITY): Payer: Self-pay

## 2023-05-07 ENCOUNTER — Other Ambulatory Visit (HOSPITAL_COMMUNITY): Payer: Self-pay

## 2023-05-21 ENCOUNTER — Other Ambulatory Visit (HOSPITAL_COMMUNITY): Payer: Self-pay

## 2023-06-11 ENCOUNTER — Other Ambulatory Visit (HOSPITAL_COMMUNITY): Payer: Self-pay

## 2023-06-17 DIAGNOSIS — L299 Pruritus, unspecified: Secondary | ICD-10-CM | POA: Insufficient documentation

## 2023-06-17 NOTE — Progress Notes (Unsigned)
   Acute Office Visit  Subjective:     Patient ID: Douglas Richards, male    DOB: 10-30-1945, 78 y.o.   MRN: [id number]  No chief complaint on file.   HPI Patient is in today for ***  ROS Per HPI      Objective:    There were no vitals taken for this visit.   Physical Exam Vitals and nursing note reviewed.  Constitutional:      General: He is not in acute distress.    Appearance: Normal appearance.  HENT:     Head: Normocephalic and atraumatic.     Right Ear: External ear normal.     Left Ear: External ear normal.     Nose: Nose normal.     Mouth/Throat:     Mouth: Mucous membranes are moist.     Pharynx: Oropharynx is clear.  Eyes:     Extraocular Movements: Extraocular movements intact.  Cardiovascular:     Rate and Rhythm: Normal rate and regular rhythm.     Pulses: Normal pulses.     Heart sounds: Normal heart sounds.  Pulmonary:     Effort: Pulmonary effort is normal. No respiratory distress.     Breath sounds: Normal breath sounds. No wheezing, rhonchi or rales.  Musculoskeletal:        General: Normal range of motion.     Cervical back: Normal range of motion.     Right lower leg: No edema.     Left lower leg: No edema.  Lymphadenopathy:     Cervical: No cervical adenopathy.  Skin:    General: Skin is warm and dry.  Neurological:     General: No focal deficit present.     Mental Status: He is alert and oriented to person, place, and time.  Psychiatric:        Mood and Affect: Mood normal.        Behavior: Behavior normal.     No results found for any visits on 06/18/23.      Assessment & Plan:   Itching  CRI (chronic renal insufficiency), stage 3 (moderate)  Type 2 diabetes mellitus with stage 2 chronic kidney disease, without long-term current use of insulin  (HCC)     No orders of the defined types were placed in this encounter.   No follow-ups on file.  Wellington Half, FNP

## 2023-06-18 ENCOUNTER — Ambulatory Visit (INDEPENDENT_AMBULATORY_CARE_PROVIDER_SITE_OTHER): Admitting: Family Medicine

## 2023-06-18 ENCOUNTER — Other Ambulatory Visit (HOSPITAL_COMMUNITY): Payer: Self-pay

## 2023-06-18 ENCOUNTER — Encounter: Payer: Self-pay | Admitting: Family Medicine

## 2023-06-18 VITALS — BP 140/70 | HR 82 | Temp 98.5°F | Ht 68.0 in | Wt 182.0 lb

## 2023-06-18 DIAGNOSIS — Z7984 Long term (current) use of oral hypoglycemic drugs: Secondary | ICD-10-CM | POA: Diagnosis not present

## 2023-06-18 DIAGNOSIS — N182 Chronic kidney disease, stage 2 (mild): Secondary | ICD-10-CM

## 2023-06-18 DIAGNOSIS — L299 Pruritus, unspecified: Secondary | ICD-10-CM

## 2023-06-18 DIAGNOSIS — E1122 Type 2 diabetes mellitus with diabetic chronic kidney disease: Secondary | ICD-10-CM | POA: Diagnosis not present

## 2023-06-18 DIAGNOSIS — N183 Chronic kidney disease, stage 3 unspecified: Secondary | ICD-10-CM

## 2023-06-18 MED ORDER — PREDNISONE 20 MG PO TABS
20.0000 mg | ORAL_TABLET | Freq: Every day | ORAL | 0 refills | Status: AC
  Filled 2023-06-18 (×2): qty 3, 3d supply, fill #0

## 2023-06-18 NOTE — Patient Instructions (Addendum)
 Prednisone 20mg  once daily for 3 days  May increase loratidine to twice a day for now  Follow-up with me for new or worsening symptoms.

## 2023-06-25 ENCOUNTER — Ambulatory Visit: Payer: Self-pay | Admitting: *Deleted

## 2023-06-25 ENCOUNTER — Ambulatory Visit (INDEPENDENT_AMBULATORY_CARE_PROVIDER_SITE_OTHER): Admitting: Family Medicine

## 2023-06-25 ENCOUNTER — Encounter: Payer: Self-pay | Admitting: Family Medicine

## 2023-06-25 ENCOUNTER — Other Ambulatory Visit (HOSPITAL_COMMUNITY): Payer: Self-pay

## 2023-06-25 VITALS — BP 136/80 | HR 72 | Temp 98.2°F | Ht 68.0 in | Wt 182.4 lb

## 2023-06-25 DIAGNOSIS — L509 Urticaria, unspecified: Secondary | ICD-10-CM

## 2023-06-25 LAB — CBC WITH DIFFERENTIAL/PLATELET
Basophils Absolute: 0.1 10*3/uL (ref 0.0–0.1)
Basophils Relative: 0.9 % (ref 0.0–3.0)
Eosinophils Absolute: 0.3 10*3/uL (ref 0.0–0.7)
Eosinophils Relative: 4.4 % (ref 0.0–5.0)
HCT: 38.7 % — ABNORMAL LOW (ref 39.0–52.0)
Hemoglobin: 13.1 g/dL (ref 13.0–17.0)
Lymphocytes Relative: 23.8 % (ref 12.0–46.0)
Lymphs Abs: 1.7 10*3/uL (ref 0.7–4.0)
MCHC: 34 g/dL (ref 30.0–36.0)
MCV: 91.6 fl (ref 78.0–100.0)
Monocytes Absolute: 0.7 10*3/uL (ref 0.1–1.0)
Monocytes Relative: 9.3 % (ref 3.0–12.0)
Neutro Abs: 4.4 10*3/uL (ref 1.4–7.7)
Neutrophils Relative %: 61.6 % (ref 43.0–77.0)
Platelets: 162 10*3/uL (ref 150.0–400.0)
RBC: 4.22 Mil/uL (ref 4.22–5.81)
RDW: 13.4 % (ref 11.5–15.5)
WBC: 7.2 10*3/uL (ref 4.0–10.5)

## 2023-06-25 LAB — COMPREHENSIVE METABOLIC PANEL WITH GFR
ALT: 19 U/L (ref 0–53)
AST: 19 U/L (ref 0–37)
Albumin: 4.4 g/dL (ref 3.5–5.2)
Alkaline Phosphatase: 37 U/L — ABNORMAL LOW (ref 39–117)
BUN: 27 mg/dL — ABNORMAL HIGH (ref 6–23)
CO2: 27 meq/L (ref 19–32)
Calcium: 9.4 mg/dL (ref 8.4–10.5)
Chloride: 94 meq/L — ABNORMAL LOW (ref 96–112)
Creatinine, Ser: 1.76 mg/dL — ABNORMAL HIGH (ref 0.40–1.50)
GFR: 36.78 mL/min — ABNORMAL LOW (ref >60.00)
Glucose, Bld: 130 mg/dL — ABNORMAL HIGH (ref 70–99)
Potassium: 4.5 meq/L (ref 3.5–5.1)
Sodium: 130 meq/L — ABNORMAL LOW (ref 135–145)
Total Bilirubin: 0.4 mg/dL (ref 0.2–1.2)
Total Protein: 6.9 g/dL (ref 6.0–8.3)

## 2023-06-25 LAB — SEDIMENTATION RATE: Sed Rate: 5 mm/h (ref 0–20)

## 2023-06-25 LAB — C-REACTIVE PROTEIN: CRP: <1 mg/dL (ref 0.5–20.0)

## 2023-06-25 MED ORDER — FAMOTIDINE 20 MG PO TABS
20.0000 mg | ORAL_TABLET | Freq: Two times a day (BID) | ORAL | 1 refills | Status: DC
  Filled 2023-06-25: qty 60, 30d supply, fill #0
  Filled 2023-08-07: qty 60, 30d supply, fill #1

## 2023-06-25 MED ORDER — LEVOCETIRIZINE DIHYDROCHLORIDE 5 MG PO TABS
5.0000 mg | ORAL_TABLET | Freq: Every evening | ORAL | 2 refills | Status: DC
  Filled 2023-06-25: qty 30, 30d supply, fill #0

## 2023-06-25 MED ORDER — TRIAMCINOLONE ACETONIDE 0.1 % EX CREA
1.0000 | TOPICAL_CREAM | Freq: Two times a day (BID) | CUTANEOUS | 0 refills | Status: DC
  Filled 2023-06-25: qty 80, 15d supply, fill #0

## 2023-06-25 NOTE — Patient Instructions (Signed)
 Xyzal  to pharmacy  Pepcid  to pharmacy  Triamcinolone  cream to pharmacy, may use this twice a day with moisturizer  Take Xyzal  and Pepcid  together twice a day due to create a histamine blockade to see if this will help with itching.  Urgent referral placed to allergist for chronic itching  We are checking labs today, will be in contact with any results that require further attention

## 2023-06-25 NOTE — Telephone Encounter (Signed)
  Chief Complaint: rash worsening, after taking prednisone  last OV 06/18/23 Symptoms: worsening rash to torso, chest , back groin area. Swelling painful itching. Mouth raw. Reports tick bite 2 1/2 weeks ago . Not sleeping  Frequency: over weekend Pertinent Negatives: Patient denies chest pain no difficulty breathing , no fever Disposition: [] ED /[] Urgent Care (no appt availability in office) / [x] Appointment(In office/virtual)/ []  [facility]/ [] Home Care/ [] Refused Recommended Disposition /[] [facility]/ []  Follow-up with PCP Additional Notes:    No available appt with PCP until June. Scheduled with other provider seen on 06/18/23 for today .      Copied from CRM [id number]. Topic: Clinical - Red Word Triage >> Jun 25, 2023  8:06 AM [place] A wrote: [facility] that prompted transfer to Nurse Triage: Patient came in last week for rash, over the weekend rash became worst there is redness, swollen and whelps all over Reason for Disposition  [1] Localized rash is very painful AND [2] no fever  Answer Assessment - Initial Assessment Questions 1. APPEARANCE of RASH: "Describe the rash."      Red, swollen, "whelps" 2. LOCATION: "Where is the rash located?"      Chest , stomach, back, groin area 3. NUMBER: "How many spots are there?"      Na  4. SIZE: "How big are the spots?" (Inches, centimeters or compare to size of a coin)      [place]  5. ONSET: "When did the rash start?"      Last weekend and took prednisone   6. ITCHING: "Does the rash itch?" If Yes, ask: "How bad is the itch?"  (Scale 0-10; or none, mild, moderate, severe)     Yes over the weekend  7. PAIN: "Does the rash hurt?" If Yes, ask: "How bad is the pain?"  (Scale 0-10; or none, mild, moderate, severe)    - NONE (0): no pain    - MILD (1-3): doesn't interfere with normal activities     - MODERATE (4-7): interferes with normal activities or awakens from sleep     - SEVERE (8-10): excruciating pain, unable to  do any normal activities     Moderate not sleeping  8. OTHER SYMPTOMS: "Do you have any other symptoms?" (e.g., fever)     Tick bite 2 1/2 weeks ago no fever 9. PREGNANCY: "Is there any chance you are pregnant?" "When was your last menstrual period?"     na  Protocols used: Rash or Redness - Localized-A-AH

## 2023-06-25 NOTE — Progress Notes (Signed)
 Acute Office Visit  Subjective:     Patient ID: Douglas Richards, male    DOB: March 24, 1945, 78 y.o.   MRN: [id number]  Chief Complaint  Patient presents with   Acute Visit    Rash is getting worse, itching all over, has gotten worse. Torso and back itch the worse    HPI Patient is in today for re evaluation of rash and itching today. Was seen by me on 06/18/2023 and was treated with prednisone  with no change in symptoms. Reports that he is itching from head to toe, and has a red bumpy rash. Denies known exposures. States he has changed laundry detergent to using vinegar and water to see if this would help, states no change in symptoms. Has been using cortisone cream and moisturizers. Has been taking loratadine and hydroxyzine  with no relief. Denies other concerns today   ROS Per HPI      Objective:    BP 136/80 (BP Location: Left Arm, Patient Position: Sitting)   Pulse 72   Temp 98.2 F (36.8 C) (Temporal)   Ht 5\' 8"  (1.727 m)   Wt 182 lb 6.4 oz (82.7 kg)   SpO2 97%   BMI 27.73 kg/m    Physical Exam Vitals and nursing note reviewed.  Constitutional:      General: He is not in acute distress.    Appearance: Normal appearance.  HENT:     Head: Normocephalic and atraumatic.     Right Ear: External ear normal.     Left Ear: External ear normal.     Nose: Nose normal.     Mouth/Throat:     Mouth: Mucous membranes are moist.     Pharynx: Oropharynx is clear.  Eyes:     Extraocular Movements: Extraocular movements intact.  Cardiovascular:     Rate and Rhythm: Normal rate and regular rhythm.     Pulses: Normal pulses.     Heart sounds: Normal heart sounds.  Pulmonary:     Effort: Pulmonary effort is normal. No respiratory distress.     Breath sounds: Normal breath sounds. No wheezing, rhonchi or rales.  Musculoskeletal:        General: Normal range of motion.     Cervical back: Normal range of motion.     Right lower leg: No edema.     Left lower leg: No  edema.  Lymphadenopathy:     Cervical: No cervical adenopathy.  Skin:    General: Skin is warm and dry.  Neurological:     General: No focal deficit present.     Mental Status: He is alert and oriented to person, place, and time.  Psychiatric:        Mood and Affect: Mood normal.        Behavior: Behavior normal.     No results found for any visits on 06/25/23.      Assessment & Plan:   Urticaria -     Ambulatory referral to Allergy -     CBC with Differential/Platelet -     Comprehensive metabolic panel with GFR -     C-reactive protein -     Sedimentation rate -     TSH -     Levocetirizine Dihydrochloride ; Take 1 tablet (5 mg total) by mouth every evening.  Dispense: 30 tablet; Refill: 2 -     Famotidine ; Take 1 tablet (20 mg total) by mouth 2 (two) times daily.  Dispense: 60 tablet; Refill: 1 -  Triamcinolone  Acetonide; Apply topically 2 (two) times daily.  Dispense: 80 g; Refill: 0     Meds ordered this encounter  Medications   levocetirizine (XYZAL ) 5 MG tablet    Sig: Take 1 tablet (5 mg total) by mouth every evening.    Dispense:  30 tablet    Refill:  2   famotidine  (PEPCID ) 20 MG tablet    Sig: Take 1 tablet (20 mg total) by mouth 2 (two) times daily.    Dispense:  60 tablet    Refill:  1   triamcinolone  cream (KENALOG ) 0.1 %    Sig: Apply topically 2 (two) times daily.    Dispense:  80 g    Refill:  0    Return if symptoms worsen or fail to improve.  Wellington Half, FNP

## 2023-06-26 ENCOUNTER — Ambulatory Visit: Payer: Self-pay | Admitting: Family Medicine

## 2023-06-26 LAB — TSH: TSH: 4.81 u[IU]/mL (ref 0.35–5.50)

## 2023-06-27 ENCOUNTER — Other Ambulatory Visit: Payer: Self-pay

## 2023-06-27 ENCOUNTER — Other Ambulatory Visit (HOSPITAL_COMMUNITY): Payer: Self-pay

## 2023-06-27 ENCOUNTER — Ambulatory Visit (INDEPENDENT_AMBULATORY_CARE_PROVIDER_SITE_OTHER): Admitting: Allergy

## 2023-06-27 ENCOUNTER — Encounter: Payer: Self-pay | Admitting: Allergy

## 2023-06-27 VITALS — BP 120/72 | HR 89 | Temp 98.1°F | Resp 18 | Ht 68.0 in | Wt 180.6 lb

## 2023-06-27 DIAGNOSIS — R21 Rash and other nonspecific skin eruption: Secondary | ICD-10-CM

## 2023-06-27 DIAGNOSIS — L309 Dermatitis, unspecified: Secondary | ICD-10-CM

## 2023-06-27 MED ORDER — METHYLPREDNISOLONE 4 MG PO TBPK
ORAL_TABLET | ORAL | 0 refills | Status: DC
  Filled 2023-06-27: qty 21, fill #0
  Filled 2023-06-27: qty 21, 6d supply, fill #0

## 2023-06-27 NOTE — Patient Instructions (Addendum)
 Rash Keep track of rashes and take pictures. Write down what you had done/eaten during flares.  See below for proper skin care. Use fragrance free and dye free products. No dryer sheets or fabric softener.    Start medrol  pak. If you notice any issues with this stop taking it and let us  know.   Start levocetirizine 5mg  daily at night. If symptoms are not controlled or causes drowsiness let us  know. Start Pepcid  (famotidine ) 20mg  twice a day.  Avoid the following potential triggers: alcohol, tight clothing, NSAIDs, hot showers and getting overheated. Stop lotrisone . Use triamcinolone  0.1% ointment twice a day as needed for rash flares. Do not use on the face, neck, armpits or groin area. Do not use more than 3 weeks in a row.   Get bloodwork. We are ordering labs, so please allow 1-2 weeks for the results to come back. With the newly implemented Cures Act, the labs might be visible to you at the same time that they become visible to me. However, I will not address the results until all of the results are back, so please be patient.   Follow up in 1 month or sooner if needed.   Skin care recommendations  Bath time: Always use lukewarm water. AVOID very hot or cold water. Keep bathing time to 5-10 minutes. Do NOT use bubble bath. Use a mild soap and use just enough to wash the dirty areas. Do NOT scrub skin vigorously.  After bathing, pat dry your skin with a towel. Do NOT rub or scrub the skin.  Moisturizers and prescriptions:  ALWAYS apply moisturizers immediately after bathing (within 3 minutes). This helps to lock-in moisture. Use the moisturizer several times a day over the whole body. Good summer moisturizers include: Aveeno, CeraVe, Cetaphil. Good winter moisturizers include: Aquaphor, Vaseline, Cerave, Cetaphil, Eucerin, Vanicream. When using moisturizers along with medications, the moisturizer should be applied about one hour after applying the medication to prevent  diluting effect of the medication or moisturize around where you applied the medications. When not using medications, the moisturizer can be continued twice daily as maintenance.  Laundry and clothing: Avoid laundry products with added color or perfumes. Use unscented hypo-allergenic laundry products such as Tide free, Cheer free & gentle, and All free and clear.  If the skin still seems dry or sensitive, you can try double-rinsing the clothes. Avoid tight or scratchy clothing such as wool. Do not use fabric softeners or dyer sheets.

## 2023-06-27 NOTE — Progress Notes (Signed)
 New Patient Note  RE: [name] MRN: [id number] DOB: 03/09/1945 Date of Office Visit: 06/27/2023  Consult requested by: Wellington Half, * Primary care provider: Genia Kettering, MD  Chief Complaint: Urticaria (Started a month ago - not sure the cause maybe pollen related - Takes pepcid , triamcinolone  cream, and xyzal  ) and Pruritus  History of Present Illness: I had the pleasure of seeing Douglas Richards for initial evaluation at the [facility] on 06/29/2023. He is a 78 y.o. male, who is referred here by [name], FNP (PCP) for the evaluation of rash.  Discussed the use of AI scribe software for clinical note transcription with the patient, who gave verbal consent to proceed.    He has been experiencing a persistent, extremely itchy rash for the past month, located on his arms, torso, back, and legs, with the back being particularly affected. The rash is flat and red, becoming more itchy in areas with bruises (on asa daily). There have been no recent changes in medications, diet, or personal care products that could have triggered the rash.  He has been using lotrisone  cream, hydroxyzine , Pepcid  (famotidine ), and triamcinolone  cream to manage the rash. Recently, he started taking levocetirizine and Pepcid , noting that hydroxyzine  was discontinued as it did not provide relief. A course of oral steroids last week provided temporary relief, but the itching returned shortly after completing the course.  He experienced a similar episode of rash and itching last year, which resolved within a week. He experiences headaches and nausea, which he attributes to his other medications. No recent illness, fever, or chills, and his appetite and bowel movements are normal.  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 He denies any personal history of asthma, allergies, or eczema. He is sensitive to high dose prednisone , having  experienced facial swelling and increased heart rate in the past. He also reports localized swelling from yellow jacket stings.  He lives in a house with central air and has a dog and a cat, both of which he has had for several years. He denies any recent changes in his home environment, such as water or mold damage, and reports that his pets do not have fleas or ticks. Nobody else in the household has a rash.     Rash started about 1 month ago. Mainly occurs on his arms, torso, back, legs. Describes them as itchy, red, flat. Individual rashes lasts are still present and didn't resolve. Some bruising due to itching. Associated symptoms include: some headache and nausea.  Frequency of episodes: daily. Suspected triggers are unknown. Denies any fevers, chills, changes in medications, foods, personal care products or recent infections. He has tried the following therapies: hydroxyzine , lotrisone  cream, triamcinolone , xyzal , pepcid , with minimal benefit. Systemic steroids: prednisone . Currently on: xyzal  and famotidine .  Previous work up includes: 2025 labs (cbc, crp, esr, tsh unremarkable, cr elevated). Previous history of rash/hives: similar symptoms last fall which resolved. Patient is up to date with the following cancer screening tests: physical exam. Patient needs colonoscopy.  06/25/2023 PCP visit: "Patient is in today for re evaluation of rash and itching today. Was seen by me on 06/18/2023 and was treated with prednisone  with no change in symptoms. Reports that he is itching from head to toe, and has a red bumpy rash. Denies known exposures. States he has changed laundry detergent to using vinegar and water to see if this would help, states no  change in symptoms. Has been using cortisone cream and moisturizers. Has been taking loratadine and hydroxyzine  with no relief. Denies other concerns today"  Assessment and Plan: Roy is a 78 y.o. male with: Rash and other nonspecific skin  eruption Dermatitis Persistent pruritic rash with no clear trigger. Prednisone  provided temporary relief. PMHx significant for CKD, HTN, DM, HLP.  Keep track of rashes and take pictures. Write down what you had done/eaten during flares.  See below for proper skin care. Use fragrance free and dye free products. No dryer sheets or fabric softener.   Start medrol  pak. If you notice any issues with this stop taking it and let us  know.  Start levocetirizine 5mg  daily at night. If symptoms are not controlled or causes drowsiness let us  know. Start Pepcid  (famotidine ) 20mg  twice a day.  Avoid the following potential triggers: alcohol, tight clothing, NSAIDs, hot showers and getting overheated. Stop lotrisone . Use triamcinolone  0.1% ointment twice a day as needed for rash flares. Do not use on the face, neck, armpits or groin area. Do not use more than 3 weeks in a row.  Get bloodwork.  Return in about 4 weeks (around 07/25/2023).  Meds ordered this encounter  Medications   methylPREDNISolone  (MEDROL  DOSEPAK) 4 MG TBPK tablet    Sig: Take 6 tablets on day 1, 5 tablets on day 2, 4 tabs on day 3, 3 tabs on day 4, 2 tabs on day 5, 1 tab on day 6.    Dispense:  21 tablet    Refill:  0   Lab Orders         Alpha-Gal Panel         ANA, IFA (with reflex)         Chronic Urticaria         Tryptase         C3 and C4      Other allergy screening: Asthma: no Rhino conjunctivitis: no Food allergy: no Medication allergy: yes Sensitive to prednisone ?  Hymenoptera allergy: no Localized swelling Urticaria: no Eczema:no History of recurrent infections suggestive of immunodeficency: no  Diagnostics: None.    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   Low back pain 06/17/2019   Chest wall contusion 01/10/2018   CRI (chronic renal insufficiency), stage 3 (moderate) 09/10/2017   Right shoulder pain 05/12/2016   Cough 05/04/2015   Abdominal pain 11/04/2014   Abdominal bloating 11/04/2014   Grief reaction 09/17/2013   Neck pain, bilateral 04/24/2013   Rash and nonspecific skin eruption 10/03/2012   Well adult exam 05/21/2012   BPH (benign prostatic hyperplasia) 05/21/2012   Stress 02/21/2012   Bronchitis, acute 01/13/2011   DM2 (diabetes mellitus, type 2) (HCC) 07/23/2009   SINUSITIS- ACUTE-NOS 03/30/2009   OLECRANON BURSITIS, LEFT 11/27/2008   TENDINITIS 04/01/2008   ELBOW PAIN, LEFT 01/09/2008   PAIN IN JOINT, ANKLE AND FOOT 01/09/2008   DIVERTICULOSIS, COLON 07/12/2007   ANAL FISSURE, HX OF 07/12/2007   TONSILLECTOMY, HX OF 07/12/2007   HEMORRHOIDECTOMY, HX OF 07/12/2007   Osteoarthritis 03/30/2007   Dyslipidemia 03/29/2007   Gout 03/29/2007   Essential hypertension 03/29/2007   ALLERGIC RHINITIS 03/29/2007  ABNORMAL GLUCOSE NEC 03/29/2007   History of colonic polyps 03/29/2007   Past Medical History:  Diagnosis Date   Allergic rhinitis    Allergy    Anal fissure    Cataract    right eye    Chronic kidney disease    Stage 3   Diabetes mellitus 02/06/2009   type II   Diverticulosis of colon (without mention of hemorrhage)    GERD (gastroesophageal reflux disease)    Gout, unspecified    Herpes zoster without mention of complication    Hyperlipidemia    Hypertension    Osteoarthrosis, unspecified whether generalized or localized, unspecified site    Other abnormal glucose    Personal history of colonic polyps    Urticaria    Past Surgical History: Past Surgical History:  Procedure Laterality Date   CATARACT EXTRACTION Bilateral    COLONOSCOPY  01/15/2014   HEMORRHOID SURGERY      HERNIA REPAIR N/A 10/29/2022   Dr Langston Pippins - [facility]   LIPOMA EXCISION     from the neck   POLYPECTOMY     rotator cuff surgery Right    TONSILLECTOMY     TONSILLECTOMY     Medication List:  Current Outpatient Medications  Medication Sig Dispense Refill   allopurinol  (ZYLOPRIM ) 100 MG tablet Take 1/2 tablet (50 mg total) by mouth daily. (Patient taking differently: Take 50 mg by mouth every evening.) 90 tablet 1   amLODipine  (NORVASC ) 5 MG tablet Take 1 tablet (5 mg total) by mouth daily. 90 tablet 1   aspirin  EC 325 MG tablet Take 650 mg by mouth every 6 (six) hours as needed (pain.).     Blood Glucose Monitoring Suppl (ACCU-CHEK GUIDE) w/Device KIT Use to check blood sugar twice daily. 1 kit 1   Cholecalciferol (VITAMIN D3) 1000 UNITS tablet Take 1,000 Units by mouth every evening.     clotrimazole -betamethasone  (LOTRISONE ) cream Apply 1 Application topically daily. 30 g 1   famotidine  (PEPCID ) 20 MG tablet Take 1 tablet (20 mg total) by mouth 2 (two) times daily. 60 tablet 1   glucose blood (FREESTYLE LITE) test strip USE TO CHECK BLOOD SUGAR TWICE DAILY 100 strip 5   glucose blood test strip Use as instructed twice daily 100 each 11   HYDROcodone -acetaminophen  (NORCO/VICODIN) 5-325 MG tablet Take 1 tablet by mouth every 6 (six) hours as needed for severe pain (pain score 7-10). 120 tablet 0   Lancets (FREESTYLE) lancets USE TO CHECK BLOOD SUGAR TWICE DAILY. Annual appt is due w/lab must see provider for future refills 100 each 5   Lancets (ONETOUCH DELICA PLUS LANCET33G) MISC Use as directed to check blood sugar twice daily. 100 each 12   levocetirizine (XYZAL ) 5 MG tablet Take 1 tablet (5 mg total) by mouth every evening. 30 tablet 2   metFORMIN  (GLUCOPHAGE ) 500 MG tablet Take 1 tablet (500 mg total) by mouth 2 (two) times daily with a meal. 180 tablet 3   methocarbamol  (ROBAXIN ) 500 MG tablet Take 1 tablet (500 mg total) by mouth every 8 (eight) hours as needed. 30 tablet  1   methylPREDNISolone  (MEDROL  DOSEPAK) 4 MG TBPK tablet Take 6 tablets on day 1, 5 tablets on day 2, 4 tabs on day 3, 3 tabs on day 4, 2 tabs on day 5, 1 tab on day 6. 21 tablet 0   MINERAL OIL PO Take 15 mLs by mouth in the morning.     ondansetron  (ZOFRAN -ODT) 4 MG disintegrating  tablet Take 1 tablet (4 mg total) by mouth every 8 (eight) hours as needed for nausea or vomiting. 30 tablet 1   ondansetron  (ZOFRAN -ODT) 4 MG disintegrating tablet Take 1 tablet (4 mg total) by mouth every 8 (eight) hours as needed for nausea or vomiting 30 tablet 1   pantoprazole  (PROTONIX ) 40 MG tablet Take 1 tablet (40 mg total) by mouth 2 (two) times daily. 60 tablet 11   Polyethyl Glycol-Propyl Glycol (LUBRICANT EYE DROPS) 0.4-0.3 % SOLN Place 1-2 drops into both eyes See admin instructions. Instill 1 drop into both eyes scheduled every morning & every 2 hours as needed for dry/irritated eyes.     repaglinide  (PRANDIN ) 1 MG tablet Take 1 tablet (1 mg total) by mouth 3 (three) times daily before meals. 270 tablet 3   rosuvastatin  (CRESTOR ) 10 MG tablet Take 1 tablet (10 mg total) by mouth daily. 90 tablet 3   tamsulosin  (FLOMAX ) 0.4 MG CAPS capsule Take 1 capsule (0.4 mg total) by mouth daily. 90 capsule 3   thiamine (VITAMIN B-1) 100 MG tablet Take 100 mg by mouth daily.     traMADol  (ULTRAM ) 50 MG tablet Take 1 tablet (50 mg total) by mouth every 6 (six) hours as needed. 30 tablet 0   trandolapril  (MAVIK ) 4 MG tablet Take 1 tablet (4 mg total) by mouth daily. 90 tablet 1   triamcinolone  cream (KENALOG ) 0.1 % Apply topically 2 (two) times daily. 80 g 0   No current facility-administered medications for this visit.   Allergies: Allergies  Allergen Reactions   Atorvastatin Other (See Comments)    Muscle/joint aches   Prednisolone Swelling    Red swollen face He had cortisone knee shot and was ok   Sulfonamide Derivatives Nausea Only   Trulicity  [Dulaglutide ]     ?pancreatitis   Social History: Social  History   Socioeconomic History   Marital status: Married    Spouse name: Not on file   Number of children: 1   Years of education: Not on file   Highest education level: Not on file  Occupational History   Occupation: retired  Tobacco Use   Smoking status: Never    Passive exposure: Never   Smokeless tobacco: Never  Vaping Use   Vaping status: Never Used  Substance and Sexual Activity   Alcohol use: Not Currently    Alcohol/week: 0.0 standard drinks of alcohol   Drug use: No   Sexual activity: Yes  Other Topics Concern   Not on file  Social History Narrative   Regular exercise- no. 1biological child, 3 adopted children   Social Drivers of Corporate investment banker Strain: Low Risk  (03/26/2023)   Overall Financial Resource Strain (CARDIA)    Difficulty of Paying Living Expenses: Not hard at all  Food Insecurity: No Food Insecurity (03/26/2023)   Hunger Vital Sign    Worried About Running Out of Food in the Last Year: Never true    Ran Out of Food in the Last Year: Never true  Transportation Needs: No Transportation Needs (03/26/2023)   PRAPARE - Administrator, Civil Service (Medical): No    Lack of Transportation (Non-Medical): No  Physical Activity: Insufficiently Active (03/26/2023)   Exercise Vital Sign    Days of Exercise per Week: 3 days    Minutes of Exercise per Session: 20 min  Stress: No Stress Concern Present (03/26/2023)   [employer] of Occupational Health - Occupational Stress Questionnaire    Feeling  of Stress : Not at all  Social Connections: Moderately Isolated (03/26/2023)   Social Connection and Isolation Panel [NHANES]    Frequency of Communication with Friends and Family: Twice a week    Frequency of Social Gatherings with Friends and Family: Once a week    Attends Religious Services: Never    Database administrator or Organizations: No    Attends Banker Meetings: Never    Marital Status: Married   Lives in a  house. Smoking: denies Occupation: retired  Landscape architect HistorySurveyor, minerals in the house: no Engineer, civil (consulting) in the family room: yes Carpet in the bedroom: yes Heating: electric Cooling: central Pet: yes 1 dog x 4-5 yrs, 1 cat x 5 yrs  Family History: Family History  Problem Relation Age of Onset   COPD Mother    Stroke Mother    Hypertension Mother    Diabetes Father    COPD Father    Alcohol abuse Father    Stroke Father    Hypertension Father    Alcohol abuse Brother    Arthritis Brother        lupus   Heart disease Brother    Hypertension Other        fam hx   Heart disease Sister    Colon cancer Neg Hx    Colon polyps Neg Hx    Esophageal cancer Neg Hx    Stomach cancer Neg Hx    Rectal cancer Neg Hx    Problem                               Relation Asthma                                   no Eczema                                no Food allergy                          no Allergic rhino conjunctivitis     no  Review of Systems  Constitutional:  Negative for appetite change, chills, fever and unexpected weight change.  HENT:  Negative for congestion and rhinorrhea.   Eyes:  Positive for itching.  Respiratory:  Negative for cough, chest tightness, shortness of breath and wheezing.   Cardiovascular:  Negative for chest pain.  Gastrointestinal:  Negative for abdominal pain.  Genitourinary:  Negative for difficulty urinating.  Skin:  Positive for rash.  Neurological:  Negative for headaches.    Objective: BP 120/72   Pulse 89   Temp 98.1 F (36.7 C) (Temporal)   Resp 18   Ht 5\' 8"  (1.727 m)   Wt 180 lb 9.6 oz (81.9 kg)   SpO2 97%   BMI 27.46 kg/m  Body mass index is 27.46 kg/m. Physical Exam Vitals and nursing note reviewed.  Constitutional:      Appearance: Normal appearance. He is well-developed.  HENT:     Head: Normocephalic and atraumatic.     Right Ear: Tympanic membrane and external ear normal.     Left Ear: Tympanic membrane and  external ear normal.     Nose: Nose normal.     Mouth/Throat:     Mouth:  Mucous membranes are moist.     Pharynx: Oropharynx is clear.  Eyes:     Conjunctiva/sclera: Conjunctivae normal.  Cardiovascular:     Rate and Rhythm: Normal rate and regular rhythm.     Heart sounds: Normal heart sounds. No murmur heard.    No friction rub. No gallop.  Pulmonary:     Effort: Pulmonary effort is normal.     Breath sounds: Normal breath sounds. No wheezing, rhonchi or rales.  Musculoskeletal:     Cervical back: Neck supple.  Skin:    General: Skin is warm.     Findings: Rash present.     Comments: Erythematous rashes on extremities with bruising on arms noted.   Neurological:     Mental Status: He is alert and oriented to person, place, and time.  Psychiatric:        Behavior: Behavior normal.    The plan was reviewed with the patient/family, and all questions/concerned were addressed.  It was my pleasure to see Douglas Richards today and participate in his care. Please feel free to contact me with any questions or concerns.  Sincerely,  Eudelia Hero, DO Allergy & Immunology  [facility]  [place] office: [phone] [facility] office: [phone]

## 2023-06-29 ENCOUNTER — Encounter: Payer: Self-pay | Admitting: Allergy

## 2023-07-01 ENCOUNTER — Other Ambulatory Visit: Payer: Self-pay | Admitting: Internal Medicine

## 2023-07-01 ENCOUNTER — Other Ambulatory Visit: Payer: Self-pay

## 2023-07-03 ENCOUNTER — Other Ambulatory Visit: Payer: Self-pay

## 2023-07-03 ENCOUNTER — Other Ambulatory Visit (HOSPITAL_COMMUNITY): Payer: Self-pay

## 2023-07-03 MED ORDER — TRANDOLAPRIL 4 MG PO TABS
4.0000 mg | ORAL_TABLET | Freq: Every day | ORAL | 1 refills | Status: DC
Start: 2023-07-03 — End: 2023-12-30
  Filled 2023-07-03: qty 90, 90d supply, fill #0
  Filled 2023-10-01: qty 90, 90d supply, fill #1

## 2023-07-03 MED ORDER — AMLODIPINE BESYLATE 5 MG PO TABS
5.0000 mg | ORAL_TABLET | Freq: Every day | ORAL | 1 refills | Status: DC
  Filled 2023-07-03: qty 90, 90d supply, fill #0
  Filled 2023-09-30: qty 90, 90d supply, fill #1

## 2023-07-04 ENCOUNTER — Other Ambulatory Visit (HOSPITAL_COMMUNITY): Payer: Self-pay

## 2023-07-05 ENCOUNTER — Ambulatory Visit: Payer: Self-pay | Admitting: Allergy

## 2023-07-05 LAB — ALPHA-GAL PANEL
Allergen Lamb IgE: <0.1 kU/L
Beef IgE: <0.1 kU/L
IgE (Immunoglobulin E), Serum: 37 [IU]/mL (ref 6–495)
O215-IgE Alpha-Gal: <0.1 kU/L
Pork IgE: <0.1 kU/L

## 2023-07-05 LAB — FANA STAINING PATTERNS
Homogeneous Pattern: 1:80 {titer}
Speckled Pattern: 1:80 {titer}

## 2023-07-05 LAB — CHRONIC URTICARIA

## 2023-07-05 LAB — ANTINUCLEAR ANTIBODIES, IFA

## 2023-07-05 LAB — C3 AND C4
Complement C3, Serum: 145 mg/dL (ref 82–167)
Complement C4, Serum: 30 mg/dL (ref 12–38)

## 2023-07-05 LAB — TRYPTASE: Tryptase: 6.7 ug/L (ref 2.2–13.2)

## 2023-07-08 ENCOUNTER — Other Ambulatory Visit: Payer: Self-pay | Admitting: Internal Medicine

## 2023-07-09 ENCOUNTER — Other Ambulatory Visit (HOSPITAL_COMMUNITY): Payer: Self-pay

## 2023-07-09 ENCOUNTER — Other Ambulatory Visit: Payer: Self-pay

## 2023-07-09 MED ORDER — METFORMIN HCL 500 MG PO TABS
500.0000 mg | ORAL_TABLET | Freq: Two times a day (BID) | ORAL | 3 refills | Status: AC
  Filled 2023-07-09: qty 180, 90d supply, fill #0
  Filled 2023-09-30: qty 180, 90d supply, fill #1
  Filled 2023-12-30: qty 180, 90d supply, fill #2

## 2023-07-12 ENCOUNTER — Other Ambulatory Visit (INDEPENDENT_AMBULATORY_CARE_PROVIDER_SITE_OTHER)

## 2023-07-12 DIAGNOSIS — E1122 Type 2 diabetes mellitus with diabetic chronic kidney disease: Secondary | ICD-10-CM | POA: Diagnosis not present

## 2023-07-12 DIAGNOSIS — N182 Chronic kidney disease, stage 2 (mild): Secondary | ICD-10-CM | POA: Diagnosis not present

## 2023-07-12 DIAGNOSIS — E875 Hyperkalemia: Secondary | ICD-10-CM | POA: Diagnosis not present

## 2023-07-12 LAB — COMPREHENSIVE METABOLIC PANEL WITH GFR
ALT: 19 U/L (ref 0–53)
AST: 18 U/L (ref 0–37)
Albumin: 4.1 g/dL (ref 3.5–5.2)
Alkaline Phosphatase: 37 U/L — ABNORMAL LOW (ref 39–117)
BUN: 33 mg/dL — ABNORMAL HIGH (ref 6–23)
CO2: 23 meq/L (ref 19–32)
Calcium: 9.4 mg/dL (ref 8.4–10.5)
Chloride: 99 meq/L (ref 96–112)
Creatinine, Ser: 1.9 mg/dL — ABNORMAL HIGH (ref 0.40–1.50)
GFR: 33.54 mL/min — ABNORMAL LOW (ref >60.00)
Glucose, Bld: 112 mg/dL — ABNORMAL HIGH (ref 70–99)
Potassium: 4.4 meq/L (ref 3.5–5.1)
Sodium: 134 meq/L — ABNORMAL LOW (ref 135–145)
Total Bilirubin: 0.4 mg/dL (ref 0.2–1.2)
Total Protein: 6.6 g/dL (ref 6.0–8.3)

## 2023-07-12 LAB — HEMOGLOBIN A1C: Hgb A1c MFr Bld: 7.4 % — ABNORMAL HIGH (ref 4.6–6.5)

## 2023-07-15 ENCOUNTER — Ambulatory Visit: Payer: Self-pay | Admitting: Internal Medicine

## 2023-07-18 ENCOUNTER — Encounter: Payer: Self-pay | Admitting: Internal Medicine

## 2023-07-18 ENCOUNTER — Other Ambulatory Visit: Payer: Self-pay

## 2023-07-18 ENCOUNTER — Other Ambulatory Visit (HOSPITAL_COMMUNITY): Payer: Self-pay

## 2023-07-18 ENCOUNTER — Ambulatory Visit: Admitting: Internal Medicine

## 2023-07-18 VITALS — BP 128/80 | HR 87 | Temp 98.0°F | Ht 68.0 in | Wt 180.0 lb

## 2023-07-18 DIAGNOSIS — M545 Low back pain, unspecified: Secondary | ICD-10-CM

## 2023-07-18 DIAGNOSIS — I10 Essential (primary) hypertension: Secondary | ICD-10-CM | POA: Diagnosis not present

## 2023-07-18 DIAGNOSIS — E1122 Type 2 diabetes mellitus with diabetic chronic kidney disease: Secondary | ICD-10-CM

## 2023-07-18 DIAGNOSIS — G8929 Other chronic pain: Secondary | ICD-10-CM | POA: Diagnosis not present

## 2023-07-18 DIAGNOSIS — N182 Chronic kidney disease, stage 2 (mild): Secondary | ICD-10-CM

## 2023-07-18 DIAGNOSIS — L299 Pruritus, unspecified: Secondary | ICD-10-CM | POA: Diagnosis not present

## 2023-07-18 MED ORDER — HYDROCODONE-ACETAMINOPHEN 5-325 MG PO TABS
1.0000 | ORAL_TABLET | Freq: Four times a day (QID) | ORAL | 0 refills | Status: AC | PRN
  Filled 2023-07-18: qty 120, 30d supply, fill #0

## 2023-07-18 NOTE — Assessment & Plan Note (Addendum)
?  etiology F/u w/Allergy - Dr Burdette Carolin 90% better

## 2023-07-18 NOTE — Progress Notes (Signed)
 Subjective:  Patient ID: Douglas Richards, male    DOB: 1945-05-15  Age: 78 y.o. MRN: [id number]  CC: Medical Management of Chronic Issues (No issues... wants to update the PCP that his Itching has improved. )   HPI Douglas Richards presents for pruritus x 4-6 wks F/u on DM, HTN  Outpatient Medications Prior to Visit  Medication Sig Dispense Refill   allopurinol  (ZYLOPRIM ) 100 MG tablet Take 1/2 tablet (50 mg total) by mouth daily. (Patient taking differently: Take 50 mg by mouth every evening.) 90 tablet 1   amLODipine  (NORVASC ) 5 MG tablet Take 1 tablet (5 mg total) by mouth daily. 90 tablet 1   aspirin  EC 325 MG tablet Take 650 mg by mouth every 6 (six) hours as needed (pain.).     Blood Glucose Monitoring Suppl (ACCU-CHEK GUIDE) w/Device KIT Use to check blood sugar twice daily. 1 kit 1   Cholecalciferol (VITAMIN D3) 1000 UNITS tablet Take 1,000 Units by mouth every evening.     famotidine  (PEPCID ) 20 MG tablet Take 1 tablet (20 mg total) by mouth 2 (two) times daily. 60 tablet 1   glucose blood (FREESTYLE LITE) test strip USE TO CHECK BLOOD SUGAR TWICE DAILY 100 strip 5   glucose blood test strip Use as instructed twice daily 100 each 11   Lancets (FREESTYLE) lancets USE TO CHECK BLOOD SUGAR TWICE DAILY. Annual appt is due w/lab must see provider for future refills 100 each 5   Lancets (ONETOUCH DELICA PLUS LANCET33G) MISC Use as directed to check blood sugar twice daily. 100 each 12   levocetirizine (XYZAL ) 5 MG tablet Take 1 tablet (5 mg total) by mouth every evening. 30 tablet 2   metFORMIN  (GLUCOPHAGE ) 500 MG tablet Take 1 tablet (500 mg total) by mouth 2 (two) times daily with a meal. 180 tablet 3   methylPREDNISolone  (MEDROL  DOSEPAK) 4 MG TBPK tablet Take 6 tablets on day 1, 5 tablets on day 2, 4 tabs on day 3, 3 tabs on day 4, 2 tabs on day 5, 1 tab on day 6. 21 tablet 0   MINERAL OIL PO Take 15 mLs by mouth in the morning.     ondansetron  (ZOFRAN -ODT) 4 MG disintegrating tablet  Take 1 tablet (4 mg total) by mouth every 8 (eight) hours as needed for nausea or vomiting. 30 tablet 1   ondansetron  (ZOFRAN -ODT) 4 MG disintegrating tablet Take 1 tablet (4 mg total) by mouth every 8 (eight) hours as needed for nausea or vomiting 30 tablet 1   pantoprazole  (PROTONIX ) 40 MG tablet Take 1 tablet (40 mg total) by mouth 2 (two) times daily. 60 tablet 11   Polyethyl Glycol-Propyl Glycol (LUBRICANT EYE DROPS) 0.4-0.3 % SOLN Place 1-2 drops into both eyes See admin instructions. Instill 1 drop into both eyes scheduled every morning & every 2 hours as needed for dry/irritated eyes.     repaglinide  (PRANDIN ) 1 MG tablet Take 1 tablet (1 mg total) by mouth 3 (three) times daily before meals. 270 tablet 3   rosuvastatin  (CRESTOR ) 10 MG tablet Take 1 tablet (10 mg total) by mouth daily. 90 tablet 3   tamsulosin  (FLOMAX ) 0.4 MG CAPS capsule Take 1 capsule (0.4 mg total) by mouth daily. 90 capsule 3   thiamine (VITAMIN B-1) 100 MG tablet Take 100 mg by mouth daily.     trandolapril  (MAVIK ) 4 MG tablet Take 1 tablet (4 mg total) by mouth daily. 90 tablet 1   triamcinolone  cream (KENALOG )  0.1 % Apply topically 2 (two) times daily. 80 g 0   methocarbamol  (ROBAXIN ) 500 MG tablet Take 1 tablet (500 mg total) by mouth every 8 (eight) hours as needed. 30 tablet 1   traMADol  (ULTRAM ) 50 MG tablet Take 1 tablet (50 mg total) by mouth every 6 (six) hours as needed. 30 tablet 0   clotrimazole -betamethasone  (LOTRISONE ) cream Apply 1 Application topically daily. (Patient not taking: Reported on 07/18/2023) 30 g 1   No facility-administered medications prior to visit.    ROS: Review of Systems  Constitutional:  Negative for appetite change, fatigue and unexpected weight change.  HENT:  Negative for congestion, nosebleeds, sneezing, sore throat and trouble swallowing.   Eyes:  Negative for itching and visual disturbance.  Respiratory:  Negative for cough.   Cardiovascular:  Negative for chest pain,  palpitations and leg swelling.  Gastrointestinal:  Negative for abdominal distention, blood in stool, diarrhea and nausea.  Genitourinary:  Negative for frequency and hematuria.  Musculoskeletal:  Negative for back pain, gait problem, joint swelling and neck pain.  Skin:  Negative for rash.  Neurological:  Negative for dizziness, tremors, speech difficulty and weakness.  Psychiatric/Behavioral:  Negative for agitation, dysphoric mood and sleep disturbance. The patient is not nervous/anxious.     Objective:  BP 128/80   Pulse 87   Temp 98 F (36.7 C) (Oral)   Ht 5' 8 (1.727 m)   Wt 180 lb (81.6 kg)   SpO2 99%   BMI 27.37 kg/m   BP Readings from Last 3 Encounters:  07/18/23 128/80  06/27/23 120/72  06/25/23 136/80    Wt Readings from Last 3 Encounters:  07/18/23 180 lb (81.6 kg)  06/27/23 180 lb 9.6 oz (81.9 kg)  06/25/23 182 lb 6.4 oz (82.7 kg)    Physical Exam Constitutional:      General: He is not in acute distress.    Appearance: He is well-developed. He is obese.     Comments: NAD  Eyes:     Conjunctiva/sclera: Conjunctivae normal.     Pupils: Pupils are equal, round, and reactive to light.  Neck:     Thyroid : No thyromegaly.     Vascular: No JVD.  Cardiovascular:     Rate and Rhythm: Normal rate and regular rhythm.     Heart sounds: Normal heart sounds. No murmur heard.    No friction rub. No gallop.  Pulmonary:     Effort: Pulmonary effort is normal. No respiratory distress.     Breath sounds: Normal breath sounds. No wheezing or rales.  Chest:     Chest wall: No tenderness.  Abdominal:     General: Bowel sounds are normal. There is no distension.     Palpations: Abdomen is soft. There is no mass.     Tenderness: There is no abdominal tenderness. There is no guarding or rebound.  Musculoskeletal:        General: No tenderness. Normal range of motion.     Cervical back: Normal range of motion.  Lymphadenopathy:     Cervical: No cervical adenopathy.   Skin:    General: Skin is warm and dry.     Findings: No rash.  Neurological:     Mental Status: He is alert and oriented to person, place, and time.     Cranial Nerves: No cranial nerve deficit.     Motor: No abnormal muscle tone.     Coordination: Coordination normal.     Gait: Gait normal.  Deep Tendon Reflexes: Reflexes are normal and symmetric.  Psychiatric:        Behavior: Behavior normal.        Thought Content: Thought content normal.        Judgment: Judgment normal.     Lab Results  Component Value Date   WBC 7.2 06/25/2023   HGB 13.1 06/25/2023   HCT 38.7 (L) 06/25/2023   PLT 162.0 06/25/2023   GLUCOSE 112 (H) 07/12/2023   CHOL 127 04/11/2023   TRIG 98.0 04/11/2023   HDL 61.70 04/11/2023   LDLDIRECT 105.5 09/10/2013   LDLCALC 46 04/11/2023   ALT 19 07/12/2023   AST 18 07/12/2023   NA 134 (L) 07/12/2023   K 4.4 07/12/2023   CL 99 07/12/2023   CREATININE 1.90 (H) 07/12/2023   BUN 33 (H) 07/12/2023   CO2 23 07/12/2023   TSH 4.81 06/25/2023   PSA 0.89 04/11/2023   HGBA1C 7.4 (H) 07/12/2023   MICROALBUR 0.4 03/31/2010    No results found.  Assessment & Plan:   Problem List Items Addressed This Visit     DM2 (diabetes mellitus, type 2) (HCC) - Primary   A little better Improve diet      Relevant Orders   Hemoglobin A1c   Comprehensive metabolic panel with GFR   Essential hypertension   Continue on amlodipine , Mavik        Low back pain    Norco prn  Potential benefits of a long term opioids use as well as potential risks (i.e. addiction risk, apnea etc) and complications (i.e. Somnolence, constipation and others) were explained to the patient and were aknowledged.       Relevant Medications   HYDROcodone -acetaminophen  (NORCO/VICODIN) 5-325 MG tablet   Pruritus   ?etiology F/u w/Allergy - Dr Burdette Carolin 90% better         Meds ordered this encounter  Medications   HYDROcodone -acetaminophen  (NORCO/VICODIN) 5-325 MG tablet    Sig: Take  1 tablet by mouth every 6 (six) hours as needed for severe pain (pain score 7-10).    Dispense:  120 tablet    Refill:  0    Please fill on or after 01/19/22      Follow-up: Return in about 3 months (around 10/18/2023) for a follow-up visit.  Anitra Barn, MD

## 2023-07-18 NOTE — Assessment & Plan Note (Signed)
 Continue on amlodipine, Mavik

## 2023-07-18 NOTE — Assessment & Plan Note (Signed)
 A little better Improve diet

## 2023-07-18 NOTE — Assessment & Plan Note (Signed)
Norco prn  Potential benefits of a long term opioids use as well as potential risks (i.e. addiction risk, apnea etc) and complications (i.e. Somnolence, constipation and others) were explained to the patient and were aknowledged. 

## 2023-07-19 ENCOUNTER — Other Ambulatory Visit: Payer: Self-pay

## 2023-07-19 ENCOUNTER — Other Ambulatory Visit (HOSPITAL_COMMUNITY): Payer: Self-pay

## 2023-07-24 ENCOUNTER — Telehealth: Payer: Self-pay | Admitting: Internal Medicine

## 2023-07-24 DIAGNOSIS — Z860100 Personal history of colon polyps, unspecified: Secondary | ICD-10-CM

## 2023-07-24 NOTE — Telephone Encounter (Unsigned)
 Copied from CRM [id number]. Topic: Referral - Question >> Jul 24, 2023  1:44 PM Antonieta Kitten wrote: Reason for CRM: Patient would like to get colonoscopy. Would like to discuss

## 2023-08-01 ENCOUNTER — Ambulatory Visit: Payer: Self-pay | Admitting: Allergy

## 2023-08-01 ENCOUNTER — Ambulatory Visit: Admitting: Allergy

## 2023-08-06 ENCOUNTER — Other Ambulatory Visit (HOSPITAL_COMMUNITY): Payer: Self-pay

## 2023-08-06 ENCOUNTER — Other Ambulatory Visit: Payer: Self-pay | Admitting: Internal Medicine

## 2023-08-06 MED ORDER — PANTOPRAZOLE SODIUM 40 MG PO TBEC
40.0000 mg | DELAYED_RELEASE_TABLET | Freq: Two times a day (BID) | ORAL | 11 refills | Status: DC
  Filled 2023-08-06: qty 60, 30d supply, fill #0

## 2023-08-07 ENCOUNTER — Other Ambulatory Visit: Payer: Self-pay

## 2023-08-08 ENCOUNTER — Other Ambulatory Visit (HOSPITAL_COMMUNITY): Payer: Self-pay

## 2023-08-13 ENCOUNTER — Other Ambulatory Visit (HOSPITAL_COMMUNITY): Payer: Self-pay

## 2023-08-13 ENCOUNTER — Other Ambulatory Visit: Payer: Self-pay

## 2023-08-27 NOTE — Telephone Encounter (Signed)
 Copied from CRM [id number]. Topic: Referral - Status >> Aug 27, 2023 11:38 AM Douglas Richards wrote: Reason for CRM: Patient is calling to follow up on the referral to [facility] that was placed on 07/29/2023, he has never received a call to schedule and would like to know what's taking so long.

## 2023-08-28 ENCOUNTER — Ambulatory Visit (INDEPENDENT_AMBULATORY_CARE_PROVIDER_SITE_OTHER)

## 2023-08-28 ENCOUNTER — Ambulatory Visit: Payer: Self-pay | Admitting: Family Medicine

## 2023-08-28 ENCOUNTER — Encounter: Payer: Self-pay | Admitting: Family Medicine

## 2023-08-28 ENCOUNTER — Ambulatory Visit (INDEPENDENT_AMBULATORY_CARE_PROVIDER_SITE_OTHER): Admitting: Family Medicine

## 2023-08-28 VITALS — BP 110/70 | HR 104 | Temp 100.8°F | Ht 68.0 in | Wt 182.6 lb

## 2023-08-28 DIAGNOSIS — B349 Viral infection, unspecified: Secondary | ICD-10-CM | POA: Diagnosis not present

## 2023-08-28 DIAGNOSIS — R509 Fever, unspecified: Secondary | ICD-10-CM

## 2023-08-28 DIAGNOSIS — R000 Tachycardia, unspecified: Secondary | ICD-10-CM | POA: Diagnosis not present

## 2023-08-28 LAB — COMPREHENSIVE METABOLIC PANEL WITH GFR
ALT: 73 U/L — ABNORMAL HIGH (ref 0–53)
AST: 84 U/L — ABNORMAL HIGH (ref 0–37)
Albumin: 4.4 g/dL (ref 3.5–5.2)
Alkaline Phosphatase: 54 U/L (ref 39–117)
BUN: 24 mg/dL — ABNORMAL HIGH (ref 6–23)
CO2: 26 meq/L (ref 19–32)
Calcium: 9.2 mg/dL (ref 8.4–10.5)
Chloride: 96 meq/L (ref 96–112)
Creatinine, Ser: 1.85 mg/dL — ABNORMAL HIGH (ref 0.40–1.50)
GFR: 34.6 mL/min — ABNORMAL LOW (ref >60.00)
Glucose, Bld: 147 mg/dL — ABNORMAL HIGH (ref 70–99)
Potassium: 4.5 meq/L (ref 3.5–5.1)
Sodium: 131 meq/L — ABNORMAL LOW (ref 135–145)
Total Bilirubin: 0.5 mg/dL (ref 0.2–1.2)
Total Protein: 7.1 g/dL (ref 6.0–8.3)

## 2023-08-28 LAB — CBC WITH DIFFERENTIAL/PLATELET
Basophils Absolute: 0 K/uL (ref 0.0–0.1)
Basophils Relative: 0.6 % (ref 0.0–3.0)
Eosinophils Absolute: 0 K/uL (ref 0.0–0.7)
Eosinophils Relative: 0 % (ref 0.0–5.0)
HCT: 41 % (ref 39.0–52.0)
Hemoglobin: 13.5 g/dL (ref 13.0–17.0)
Lymphocytes Relative: 13.4 % (ref 12.0–46.0)
Lymphs Abs: 0.6 K/uL — ABNORMAL LOW (ref 0.7–4.0)
MCHC: 33.1 g/dL (ref 30.0–36.0)
MCV: 93.8 fl (ref 78.0–100.0)
Monocytes Absolute: 0.5 K/uL (ref 0.1–1.0)
Monocytes Relative: 9.7 % (ref 3.0–12.0)
Neutro Abs: 3.6 K/uL (ref 1.4–7.7)
Neutrophils Relative %: 76.3 % (ref 43.0–77.0)
Platelets: 109 K/uL — ABNORMAL LOW (ref 150.0–400.0)
RBC: 4.37 Mil/uL (ref 4.22–5.81)
RDW: 13.5 % (ref 11.5–15.5)
WBC: 4.7 K/uL (ref 4.0–10.5)

## 2023-08-28 LAB — POCT URINALYSIS DIP (CLINITEK)
Bilirubin, UA: NEGATIVE
Blood, UA: NEGATIVE
Glucose, UA: NEGATIVE mg/dL
Ketones, POC UA: NEGATIVE mg/dL
Leukocytes, UA: NEGATIVE
Nitrite, UA: NEGATIVE
Spec Grav, UA: 1.015 (ref 1.010–1.025)
Urobilinogen, UA: 0.2 U/dL
pH, UA: 6 (ref 5.0–8.0)

## 2023-08-28 NOTE — Patient Instructions (Signed)
 We are checking labs today, will be in contact with any results that require further attention

## 2023-08-29 NOTE — Progress Notes (Signed)
 Acute Office Visit  Subjective:     Patient ID: Douglas Richards, male    DOB: Mar 02, 1945, 78 y.o.   MRN: [id number]  Chief Complaint  Patient presents with   Acute Visit    HPI  Discussed the use of AI scribe software for clinical note transcription with the patient, who gave verbal consent to proceed.  History of Present Illness HITESH FOUCHE is a 78 year old male who presents with fever, chills, and headaches.  Constitutional symptoms - Fever and chills began two days ago after moving furniture - Fatigue initially present, temporarily improved after a nap - Fever and chills recurred by Monday morning - Feels warm with cold hands - Aspirin  taken to reduce temperature with limited effect  Headache - Headaches located around and behind the eyes  Musculoskeletal symptoms - Muscle and joint pain present  Neck stiffness - Neck feels stiff - Able to move chin to chest without significant discomfort  Associated symptoms - No nausea, vomiting, significant coughing, or dizziness     ROS Per HPI      Objective:    BP 110/70 (BP Location: Left Arm, Patient Position: Sitting)   Pulse (!) 104   Temp (!) 100.8 F (38.2 C) (Oral)   Ht 5' 8 (1.727 m)   Wt 182 lb 9.6 oz (82.8 kg)   SpO2 93%   BMI 27.76 kg/m    Physical Exam Vitals and nursing note reviewed.  Constitutional:      General: He is not in acute distress.    Appearance: He is ill-appearing.     Comments: Febrile   HENT:     Head: Normocephalic and atraumatic.     Right Ear: External ear normal.     Left Ear: External ear normal.     Nose: Nose normal.     Mouth/Throat:     Mouth: Mucous membranes are moist.     Pharynx: Oropharynx is clear.  Eyes:     Extraocular Movements: Extraocular movements intact.  Cardiovascular:     Rate and Rhythm: Regular rhythm. Tachycardia present.     Pulses: Normal pulses.     Heart sounds: Normal heart sounds.  Pulmonary:     Effort: Pulmonary effort is  normal. No respiratory distress.     Breath sounds: Normal breath sounds. No wheezing, rhonchi or rales.  Musculoskeletal:        General: Normal range of motion.     Cervical back: Normal range of motion.     Right lower leg: No edema.     Left lower leg: No edema.  Lymphadenopathy:     Cervical: Cervical adenopathy present.  Skin:    General: Skin is warm and dry.  Neurological:     General: No focal deficit present.     Mental Status: He is alert and oriented to person, place, and time.  Psychiatric:        Mood and Affect: Mood normal.        Behavior: Behavior normal.     Results for orders placed or performed in visit on 08/28/23  CBC w/Diff  Result Value Ref Range   WBC 4.7 4.0 - 10.5 K/uL   RBC 4.37 4.22 - 5.81 Mil/uL   Hemoglobin 13.5 13.0 - 17.0 g/dL   HCT 58.9 60.9 - 47.9 %   MCV 93.8 78.0 - 100.0 fl   MCHC 33.1 30.0 - 36.0 g/dL   RDW 86.4 88.4 - 84.4 %   Platelets  109.0 (L) 150.0 - 400.0 K/uL   Neutrophils Relative % 76.3 43.0 - 77.0 %   Lymphocytes Relative 13.4 12.0 - 46.0 %   Monocytes Relative 9.7 3.0 - 12.0 %   Eosinophils Relative 0.0 0.0 - 5.0 %   Basophils Relative 0.6 0.0 - 3.0 %   Neutro Abs 3.6 1.4 - 7.7 K/uL   Lymphs Abs 0.6 (L) 0.7 - 4.0 K/uL   Monocytes Absolute 0.5 0.1 - 1.0 K/uL   Eosinophils Absolute 0.0 0.0 - 0.7 K/uL   Basophils Absolute 0.0 0.0 - 0.1 K/uL  Comp Met (CMET)  Result Value Ref Range   Sodium 131 (L) 135 - 145 mEq/L   Potassium 4.5 3.5 - 5.1 mEq/L   Chloride 96 96 - 112 mEq/L   CO2 26 19 - 32 mEq/L   Glucose, Bld 147 (H) 70 - 99 mg/dL   BUN 24 (H) 6 - 23 mg/dL   Creatinine, Ser 8.14 (H) 0.40 - 1.50 mg/dL   Total Bilirubin 0.5 0.2 - 1.2 mg/dL   Alkaline Phosphatase 54 39 - 117 U/L   AST 84 (H) 0 - 37 U/L   ALT 73 (H) 0 - 53 U/L   Total Protein 7.1 6.0 - 8.3 g/dL   Albumin  4.4 3.5 - 5.2 g/dL   GFR 65.39 (L) >39.99 mL/min   Calcium  9.2 8.4 - 10.5 mg/dL  POCT URINALYSIS DIP (CLINITEK)  Result Value Ref Range   Color,  UA yellow yellow   Clarity, UA clear clear   Glucose, UA negative negative mg/dL   Bilirubin, UA negative negative   Ketones, POC UA negative negative mg/dL   Spec Grav, UA 8.984 8.989 - 1.025   Blood, UA negative negative   pH, UA 6.0 5.0 - 8.0   POC PROTEIN,UA trace negative, trace   Urobilinogen, UA 0.2 0.2 or 1.0 E.U./dL   Nitrite, UA Negative Negative   Leukocytes, UA Negative Negative        Assessment & Plan:   Assessment and Plan Assessment & Plan Acute Viral Syndrome Rapid onset of headache, myalgia, arthralgia, and fever suggests viral etiology. COVID-19 test negative. - Continue supportive care at  home - If you become SOB, fever is not somewhat alleviated by tylenol /ibuprofen  then I would like you to follow in the ER for further eval  - CXR r/o PNA  Prostate Issues Chronic prostate problems may contribute to urinary symptoms. - Request urine sample for analysis.     Orders Placed This Encounter  Procedures   DG Chest 2 View    Standing Status:   Future    Number of Occurrences:   1    Expiration Date:   02/28/2024    Reason for Exam (SYMPTOM  OR DIAGNOSIS REQUIRED):   fever    Release to patient:   Immediate    Preferred imaging location?:   [facility]   CBC w/Diff   Comp Met (CMET)   POCT URINALYSIS DIP (CLINITEK)     No orders of the defined types were placed in this encounter.   Return if symptoms worsen or fail to improve.  Corean LITTIE Ku, FNP

## 2023-08-31 NOTE — Progress Notes (Signed)
 Acute Office Visit  Subjective:     Patient ID: Douglas Richards, male    DOB: 04/17/1945, 78 y.o.   MRN: [id number]  Chief Complaint  Patient presents with   Acute Visit    HPI Patient presented to the clinic with complaints of fever and chills for 2 days. He reports that he has also experienced malaise and fatigue. He states that he has had a non-productive cough, headache, neck stiffness on lateral rotation, mild rhinorrhea, myalgia, arthralgia, and urinary frequency. He denies recent travel or being in the presence of sick individuals. The patient reports that he has been taking aspirin  for the discomfort and fever with minimal relief.   ROS See HPI     Objective:    BP 110/70 (BP Location: Left Arm, Patient Position: Sitting)   Pulse (!) 104   Temp (!) 100.8 F (38.2 C) (Oral)   Ht 5' 8 (1.727 m)   Wt 182 lb 9.6 oz (82.8 kg)   SpO2 93%   BMI 27.76 kg/m    Physical Exam Vitals reviewed.  Constitutional:      Appearance: He is ill-appearing.  HENT:     Head: Normocephalic and atraumatic.     Right Ear: Tympanic membrane normal.     Left Ear: Tympanic membrane normal.     Nose: Rhinorrhea present.     Mouth/Throat:     Mouth: Mucous membranes are dry.     Pharynx: Oropharynx is clear.  Eyes:     Extraocular Movements: Extraocular movements intact.     Conjunctiva/sclera: Conjunctivae normal.     Pupils: Pupils are equal, round, and reactive to light.  Neck:     Comments: Noted limited lateral rotation.  Cardiovascular:     Rate and Rhythm: Normal rate and regular rhythm.     Pulses: Normal pulses.     Heart sounds: Normal heart sounds.  Pulmonary:     Effort: Pulmonary effort is normal.     Breath sounds: Normal breath sounds.  Abdominal:     General: Bowel sounds are normal.     Palpations: Abdomen is soft.  Musculoskeletal:     Cervical back: No rigidity or tenderness.  Lymphadenopathy:     Cervical: No cervical adenopathy.  Skin:    General: Skin  is warm and dry.     Capillary Refill: Capillary refill takes 2 to 3 seconds.     Findings: No rash.  Neurological:     Mental Status: He is alert and oriented to person, place, and time.  Psychiatric:        Mood and Affect: Mood normal.        Behavior: Behavior normal.    Results for orders placed or performed in visit on 08/28/23  CBC w/Diff  Result Value Ref Range   WBC 4.7 4.0 - 10.5 K/uL   RBC 4.37 4.22 - 5.81 Mil/uL   Hemoglobin 13.5 13.0 - 17.0 g/dL   HCT 58.9 60.9 - 47.9 %   MCV 93.8 78.0 - 100.0 fl   MCHC 33.1 30.0 - 36.0 g/dL   RDW 86.4 88.4 - 84.4 %   Platelets 109.0 (L) 150.0 - 400.0 K/uL   Neutrophils Relative % 76.3 43.0 - 77.0 %   Lymphocytes Relative 13.4 12.0 - 46.0 %   Monocytes Relative 9.7 3.0 - 12.0 %   Eosinophils Relative 0.0 0.0 - 5.0 %   Basophils Relative 0.6 0.0 - 3.0 %   Neutro Abs 3.6 1.4 - 7.7  K/uL   Lymphs Abs 0.6 (L) 0.7 - 4.0 K/uL   Monocytes Absolute 0.5 0.1 - 1.0 K/uL   Eosinophils Absolute 0.0 0.0 - 0.7 K/uL   Basophils Absolute 0.0 0.0 - 0.1 K/uL  Comp Met (CMET)  Result Value Ref Range   Sodium 131 (L) 135 - 145 mEq/L   Potassium 4.5 3.5 - 5.1 mEq/L   Chloride 96 96 - 112 mEq/L   CO2 26 19 - 32 mEq/L   Glucose, Bld 147 (H) 70 - 99 mg/dL   BUN 24 (H) 6 - 23 mg/dL   Creatinine, Ser 8.14 (H) 0.40 - 1.50 mg/dL   Total Bilirubin 0.5 0.2 - 1.2 mg/dL   Alkaline Phosphatase 54 39 - 117 U/L   AST 84 (H) 0 - 37 U/L   ALT 73 (H) 0 - 53 U/L   Total Protein 7.1 6.0 - 8.3 g/dL   Albumin  4.4 3.5 - 5.2 g/dL   GFR 65.39 (L) >39.99 mL/min   Calcium  9.2 8.4 - 10.5 mg/dL  POCT URINALYSIS DIP (CLINITEK)  Result Value Ref Range   Color, UA yellow yellow   Clarity, UA clear clear   Glucose, UA negative negative mg/dL   Bilirubin, UA negative negative   Ketones, POC UA negative negative mg/dL   Spec Grav, UA 8.984 8.989 - 1.025   Blood, UA negative negative   pH, UA 6.0 5.0 - 8.0   POC PROTEIN,UA trace negative, trace   Urobilinogen, UA 0.2  0.2 or 1.0 E.U./dL   Nitrite, UA Negative Negative   Leukocytes, UA Negative Negative        Assessment & Plan:   Problem List Items Addressed This Visit   None Visit Diagnoses       Fever, unspecified fever cause    -  Primary   Relevant Orders   POCT URINALYSIS DIP (CLINITEK) (Completed)   DG Chest 2 View (Completed)   CBC w/Diff (Completed)   Comp Met (CMET) (Completed)     - Discussed with patient the desire to send him to the Emergency Department for additional evaluation and treatment. Patient does not wish to do that. He agreed to return to the office or go to the ED if symptoms persist or worsen.   - Urinalysis negative  - STAT CXR - no acute findings  Return if symptoms worsen or fail to improve.  Debby CHRISTELLA Borer, RN

## 2023-09-04 DIAGNOSIS — R509 Fever, unspecified: Secondary | ICD-10-CM | POA: Insufficient documentation

## 2023-09-04 DIAGNOSIS — B349 Viral infection, unspecified: Secondary | ICD-10-CM | POA: Insufficient documentation

## 2023-09-11 ENCOUNTER — Ambulatory Visit (INDEPENDENT_AMBULATORY_CARE_PROVIDER_SITE_OTHER): Admitting: Family Medicine

## 2023-09-11 ENCOUNTER — Other Ambulatory Visit (HOSPITAL_COMMUNITY): Payer: Self-pay

## 2023-09-11 ENCOUNTER — Encounter: Payer: Self-pay | Admitting: Family Medicine

## 2023-09-11 ENCOUNTER — Ambulatory Visit: Payer: Self-pay

## 2023-09-11 ENCOUNTER — Other Ambulatory Visit: Payer: Self-pay

## 2023-09-11 VITALS — BP 124/70 | HR 90 | Temp 98.5°F | Ht 68.0 in | Wt 173.0 lb

## 2023-09-11 DIAGNOSIS — B9689 Other specified bacterial agents as the cause of diseases classified elsewhere: Secondary | ICD-10-CM

## 2023-09-11 DIAGNOSIS — J069 Acute upper respiratory infection, unspecified: Secondary | ICD-10-CM

## 2023-09-11 DIAGNOSIS — K2100 Gastro-esophageal reflux disease with esophagitis, without bleeding: Secondary | ICD-10-CM | POA: Diagnosis not present

## 2023-09-11 DIAGNOSIS — H66002 Acute suppurative otitis media without spontaneous rupture of ear drum, left ear: Secondary | ICD-10-CM | POA: Diagnosis not present

## 2023-09-11 MED ORDER — OMEPRAZOLE 40 MG PO CPDR
40.0000 mg | DELAYED_RELEASE_CAPSULE | Freq: Every day | ORAL | 1 refills | Status: DC
  Filled 2023-09-11: qty 90, 90d supply, fill #0
  Filled 2023-10-01: qty 90, 90d supply, fill #1

## 2023-09-11 MED ORDER — AMOXICILLIN-POT CLAVULANATE 875-125 MG PO TABS
1.0000 | ORAL_TABLET | Freq: Two times a day (BID) | ORAL | 0 refills | Status: AC
  Filled 2023-09-11: qty 14, 7d supply, fill #0

## 2023-09-11 NOTE — Patient Instructions (Addendum)
 I have sent in Augmentin  for you to take twice a day for 14 days.  This medication can upset your stomach, so I tell everyone to take it with a meal.  Follow-up with me with no improvement over the next 2 days.

## 2023-09-11 NOTE — Telephone Encounter (Signed)
 FYI Only or Action Required?: FYI only for provider.  Patient was last seen in primary care on 08/28/2023 by Alvia Corean CROME, FNP.  Called Nurse Triage reporting Dizziness.  Symptoms began several days ago.  Interventions attempted: Rest, hydration, or home remedies.  Symptoms are: gradually improving, however some symptoms persist.  Triage Disposition: See PCP When Office is Open (Within 3 Days)  Patient/caregiver understands and will follow disposition?: Yes   **Appt. Scheduled for 8/5 for follow-up**            Copied from CRM #[id number]. Topic: Clinical - Red Word Triage >> Sep 11, 2023 10:48 AM Tiffany B wrote: Red Word that prompted transfer to Nurse Triage: Patient temp is 101, dizziness and symptoms have not improved since last OV on 7/22. Reason for Disposition  [1] MILD weakness (e.g., does not interfere with ability to work, go to school, normal activities) AND [2] persists > 1 week  Answer Assessment - Initial Assessment Questions 1. DESCRIPTION: Describe how you are feeling.     Weakness, fatigue, no taste  2. SEVERITY: How bad is it?  Can you stand and walk?     3/10  3. ONSET: When did these symptoms begin? (e.g., hours, days, weeks, months)     7/22  4. CAUSE: What do you think is causing the weakness or fatigue? (e.g., not drinking enough fluids, medical problem, trouble sleeping)     Unknown    5. NEW MEDICINES:  Have you started on any new medicines recently? (e.g., opioid pain medicines, benzodiazepines, muscle relaxants, antidepressants, antihistamines, neuroleptics, beta blockers)     No   6. OTHER SYMPTOMS: Do you have any other symptoms? (e.g., chest pain, fever, cough, SOB, vomiting, diarrhea, bleeding, other areas of pain)      Mild fever 98.2 (currently) , nausea.   Patient was seen in the office on 7/22, patient mentioned while he feels slightly better, some symptoms are lingering, and he is concerned with the  Fatigue, and off balance feeling. Pt. Would like to follow-up as symptoms persist. Appt. Scheduled for 8/5  Protocols used: Weakness (Generalized) and Fatigue-A-AH

## 2023-09-11 NOTE — Progress Notes (Signed)
 Acute Office Visit  Subjective:     Patient ID: Douglas Richards, male    DOB: [birth year], 78 y.o.   MRN: [id number]  Chief Complaint  Patient presents with   Fatigue    Was in 7/22 not feeling well, states he has gotten a little better but not feeling healthy. Really fatigued, doesn't have appetite, sensitive to smells, unsteady (started 7/21)    HPI  78 year old male presents for reevaluation after being seen on 08/28/23 for viral illness. Reports considerable fatigue, decreased appetite, sensitivity to smells. Has been treating with supportive care at home. Has lost 9 pounds since his last visit on 08/28/23. Reports increased congestion, head pain and pressure, ear pains. Denies continued fever. Denies known sick contacts. Denies abdominal pain, nausea, vomiting, diarrhea, rash, other symptoms.  ROS Per HPI      Objective:    BP 124/70   Pulse 90   Temp 98.5 F (36.9 C) (Oral)   Ht 5' 8 (1.727 m)   Wt 173 lb (78.5 kg)   SpO2 99%   BMI 26.30 kg/m    Physical Exam Vitals and nursing note reviewed.  Constitutional:      General: He is not in acute distress.    Appearance: Normal appearance.     Comments: Appears fatigued  HENT:     Head: Normocephalic and atraumatic.     Right Ear: External ear normal. A middle ear effusion is present. Tympanic membrane is not erythematous or bulging.     Left Ear: External ear normal. A middle ear effusion is present. Tympanic membrane is erythematous and bulging.     Nose:     Right Sinus: No maxillary sinus tenderness or frontal sinus tenderness.     Left Sinus: Maxillary sinus tenderness and frontal sinus tenderness present.     Mouth/Throat:     Mouth: Mucous membranes are moist.     Pharynx: Oropharynx is clear.  Eyes:     Extraocular Movements: Extraocular movements intact.  Cardiovascular:     Rate and Rhythm: Normal rate and regular rhythm.     Pulses: Normal pulses.     Heart sounds: Normal heart sounds.   Pulmonary:     Effort: Pulmonary effort is normal. No respiratory distress.     Breath sounds: Normal breath sounds. No wheezing, rhonchi or rales.  Musculoskeletal:        General: Normal range of motion.     Cervical back: Normal range of motion.     Right lower leg: No edema.     Left lower leg: No edema.  Lymphadenopathy:     Cervical: Cervical adenopathy present.  Skin:    General: Skin is warm and dry.  Neurological:     General: No focal deficit present.     Mental Status: He is alert and oriented to person, place, and time.  Psychiatric:        Mood and Affect: Mood normal.        Behavior: Behavior normal.     No results found for any visits on 09/11/23.      Assessment & Plan:  Non-recurrent acute suppurative otitis media of left ear without spontaneous rupture of tympanic membrane -     Amoxicillin -Pot Clavulanate; Take 1 tablet by mouth 2 (two) times daily for 7 days.  Dispense: 14 tablet; Refill: 0  Bacterial URI -     Amoxicillin -Pot Clavulanate; Take 1 tablet by mouth 2 (two) times daily for 7 days.  Dispense:  14 tablet; Refill: 0  Gastroesophageal reflux disease with esophagitis without hemorrhage -     Omeprazole ; Take 1 capsule (40 mg total) by mouth daily.  Dispense: 90 capsule; Refill: 1     No orders of the defined types were placed in this encounter.    Meds ordered this encounter  Medications   amoxicillin -clavulanate (AUGMENTIN ) 875-125 MG tablet    Sig: Take 1 tablet by mouth 2 (two) times daily for 7 days.    Dispense:  14 tablet    Refill:  0   omeprazole  (PRILOSEC) 40 MG capsule    Sig: Take 1 capsule (40 mg total) by mouth daily.    Dispense:  90 capsule    Refill:  1    Will come to pick up with Augmentin     Follow-up with me in 2 days if you are not feeling any better.  Corean LITTIE Ku, FNP

## 2023-09-12 ENCOUNTER — Other Ambulatory Visit: Payer: Self-pay

## 2023-09-20 DIAGNOSIS — H35373 Puckering of macula, bilateral: Secondary | ICD-10-CM | POA: Diagnosis not present

## 2023-09-20 DIAGNOSIS — H4911 Fourth [trochlear] nerve palsy, right eye: Secondary | ICD-10-CM | POA: Diagnosis not present

## 2023-09-20 DIAGNOSIS — H40013 Open angle with borderline findings, low risk, bilateral: Secondary | ICD-10-CM | POA: Diagnosis not present

## 2023-09-20 DIAGNOSIS — Z961 Presence of intraocular lens: Secondary | ICD-10-CM | POA: Diagnosis not present

## 2023-09-30 ENCOUNTER — Other Ambulatory Visit: Payer: Self-pay | Admitting: Internal Medicine

## 2023-09-30 MED ORDER — ROSUVASTATIN CALCIUM 10 MG PO TABS
10.0000 mg | ORAL_TABLET | Freq: Every day | ORAL | 3 refills | Status: AC
  Filled 2023-09-30: qty 90, 90d supply, fill #0
  Filled 2023-12-30: qty 90, 90d supply, fill #1

## 2023-10-01 ENCOUNTER — Other Ambulatory Visit: Payer: Self-pay

## 2023-10-01 ENCOUNTER — Other Ambulatory Visit (HOSPITAL_COMMUNITY): Payer: Self-pay

## 2023-10-07 ENCOUNTER — Other Ambulatory Visit: Payer: Self-pay | Admitting: Internal Medicine

## 2023-10-08 ENCOUNTER — Other Ambulatory Visit (HOSPITAL_COMMUNITY): Payer: Self-pay

## 2023-10-08 MED ORDER — ALLOPURINOL 100 MG PO TABS
50.0000 mg | ORAL_TABLET | Freq: Every day | ORAL | 1 refills | Status: AC
  Filled 2023-10-08: qty 45, 90d supply, fill #0
  Filled 2024-01-06: qty 45, 90d supply, fill #1

## 2023-10-09 ENCOUNTER — Other Ambulatory Visit (HOSPITAL_COMMUNITY): Payer: Self-pay

## 2023-10-15 ENCOUNTER — Other Ambulatory Visit (INDEPENDENT_AMBULATORY_CARE_PROVIDER_SITE_OTHER)

## 2023-10-15 DIAGNOSIS — N182 Chronic kidney disease, stage 2 (mild): Secondary | ICD-10-CM

## 2023-10-15 DIAGNOSIS — E1122 Type 2 diabetes mellitus with diabetic chronic kidney disease: Secondary | ICD-10-CM

## 2023-10-15 LAB — COMPREHENSIVE METABOLIC PANEL WITH GFR
ALT: 15 U/L (ref 0–53)
AST: 18 U/L (ref 0–37)
Albumin: 4.4 g/dL (ref 3.5–5.2)
Alkaline Phosphatase: 54 U/L (ref 39–117)
BUN: 25 mg/dL — ABNORMAL HIGH (ref 6–23)
CO2: 26 meq/L (ref 19–32)
Calcium: 9.7 mg/dL (ref 8.4–10.5)
Chloride: 97 meq/L (ref 96–112)
Creatinine, Ser: 1.78 mg/dL — ABNORMAL HIGH (ref 0.40–1.50)
GFR: 36.21 mL/min — ABNORMAL LOW (ref >60.00)
Glucose, Bld: 112 mg/dL — ABNORMAL HIGH (ref 70–99)
Potassium: 4.5 meq/L (ref 3.5–5.1)
Sodium: 134 meq/L — ABNORMAL LOW (ref 135–145)
Total Bilirubin: 0.6 mg/dL (ref 0.2–1.2)
Total Protein: 7.5 g/dL (ref 6.0–8.3)

## 2023-10-15 LAB — HEMOGLOBIN A1C: Hgb A1c MFr Bld: 7.1 % — ABNORMAL HIGH (ref 4.6–6.5)

## 2023-10-16 ENCOUNTER — Ambulatory Visit: Payer: Self-pay | Admitting: Internal Medicine

## 2023-10-18 ENCOUNTER — Encounter: Payer: Self-pay | Admitting: Internal Medicine

## 2023-10-18 ENCOUNTER — Other Ambulatory Visit (HOSPITAL_COMMUNITY): Payer: Self-pay

## 2023-10-18 ENCOUNTER — Ambulatory Visit (INDEPENDENT_AMBULATORY_CARE_PROVIDER_SITE_OTHER): Admitting: Internal Medicine

## 2023-10-18 ENCOUNTER — Other Ambulatory Visit: Payer: Self-pay

## 2023-10-18 VITALS — BP 130/78 | HR 92 | Temp 98.3°F | Ht 68.0 in | Wt 177.2 lb

## 2023-10-18 DIAGNOSIS — N400 Enlarged prostate without lower urinary tract symptoms: Secondary | ICD-10-CM

## 2023-10-18 DIAGNOSIS — N183 Chronic kidney disease, stage 3 unspecified: Secondary | ICD-10-CM

## 2023-10-18 DIAGNOSIS — N182 Chronic kidney disease, stage 2 (mild): Secondary | ICD-10-CM

## 2023-10-18 DIAGNOSIS — Z23 Encounter for immunization: Secondary | ICD-10-CM | POA: Diagnosis not present

## 2023-10-18 DIAGNOSIS — M1000 Idiopathic gout, unspecified site: Secondary | ICD-10-CM

## 2023-10-18 DIAGNOSIS — E1122 Type 2 diabetes mellitus with diabetic chronic kidney disease: Secondary | ICD-10-CM | POA: Diagnosis not present

## 2023-10-18 DIAGNOSIS — I10 Essential (primary) hypertension: Secondary | ICD-10-CM

## 2023-10-18 DIAGNOSIS — K2100 Gastro-esophageal reflux disease with esophagitis, without bleeding: Secondary | ICD-10-CM

## 2023-10-18 MED ORDER — OMEPRAZOLE 40 MG PO CPDR
40.0000 mg | DELAYED_RELEASE_CAPSULE | Freq: Every day | ORAL | 3 refills | Status: AC
  Filled 2023-10-18 – 2023-11-18 (×3): qty 90, 90d supply, fill #0
  Filled 2024-02-10: qty 90, 90d supply, fill #1

## 2023-10-18 MED ORDER — TADALAFIL 5 MG PO TABS
5.0000 mg | ORAL_TABLET | Freq: Every day | ORAL | 11 refills | Status: DC
  Filled 2023-10-18: qty 30, 30d supply, fill #0
  Filled 2023-11-13: qty 30, 30d supply, fill #1
  Filled 2023-12-09: qty 30, 30d supply, fill #2
  Filled 2024-01-06: qty 30, 30d supply, fill #3

## 2023-10-18 NOTE — Assessment & Plan Note (Signed)
 A little better Improve diet

## 2023-10-18 NOTE — Assessment & Plan Note (Signed)
 Cont on Flomax  Add Cialis  5 mg/d

## 2023-10-18 NOTE — Assessment & Plan Note (Signed)
 On Mavik Avoid NSAIDs F/u w/Nephrology

## 2023-10-18 NOTE — Assessment & Plan Note (Signed)
Cont on allopurinol dose 50 mg a day

## 2023-10-18 NOTE — Progress Notes (Unsigned)
 Subjective:  Patient ID: Douglas Richards, male    DOB: 06-Nov-1945  Age: 78 y.o. MRN: [id number]  CC: Follow-up (Patient states he had a real bad flu or cold and ended up with ear infection back in July and amoxiclin took care of it. )   HPI Douglas Richards presents for Follow-up - LBP/OA. F/u on DM Patient states he had a real bad flu or cold and ended up with ear infection back in July and amoxiclin took care of it.   Outpatient Medications Prior to Visit  Medication Sig Dispense Refill   allopurinol  (ZYLOPRIM ) 100 MG tablet Take 1/2 tablet (50 mg total) by mouth daily. 90 tablet 1   amLODipine  (NORVASC ) 5 MG tablet Take 1 tablet (5 mg total) by mouth daily. 90 tablet 1   aspirin  EC 325 MG tablet Take 650 mg by mouth every 6 (six) hours as needed (pain.).     Blood Glucose Monitoring Suppl (ACCU-CHEK GUIDE) w/Device KIT Use to check blood sugar twice daily. 1 kit 1   Cholecalciferol (VITAMIN D3) 1000 UNITS tablet Take 1,000 Units by mouth every evening.     famotidine  (PEPCID ) 20 MG tablet Take 1 tablet (20 mg total) by mouth 2 (two) times daily. 60 tablet 1   glucose blood (FREESTYLE LITE) test strip USE TO CHECK BLOOD SUGAR TWICE DAILY 100 strip 5   glucose blood test strip Use as instructed twice daily 100 each 11   HYDROcodone -acetaminophen  (NORCO/VICODIN) 5-325 MG tablet Take 1 tablet by mouth every 6 (six) hours as needed for severe pain (pain score 7-10). 120 tablet 0   Lancets (ONETOUCH DELICA PLUS LANCET33G) MISC Use as directed to check blood sugar twice daily. 100 each 12   levocetirizine (XYZAL ) 5 MG tablet Take 1 tablet (5 mg total) by mouth every evening. 30 tablet 2   metFORMIN  (GLUCOPHAGE ) 500 MG tablet Take 1 tablet (500 mg total) by mouth 2 (two) times daily with a meal. 180 tablet 3   methylPREDNISolone  (MEDROL  DOSEPAK) 4 MG TBPK tablet Take 6 tablets on day 1, 5 tablets on day 2, 4 tabs on day 3, 3 tabs on day 4, 2 tabs on day 5, 1 tab on day 6. 21 tablet 0   MINERAL OIL  PO Take 15 mLs by mouth in the morning.     ondansetron  (ZOFRAN -ODT) 4 MG disintegrating tablet Take 1 tablet (4 mg total) by mouth every 8 (eight) hours as needed for nausea or vomiting 30 tablet 1   Polyethyl Glycol-Propyl Glycol (LUBRICANT EYE DROPS) 0.4-0.3 % SOLN Place 1-2 drops into both eyes See admin instructions. Instill 1 drop into both eyes scheduled every morning & every 2 hours as needed for dry/irritated eyes.     repaglinide  (PRANDIN ) 1 MG tablet Take 1 tablet (1 mg total) by mouth 3 (three) times daily before meals. 270 tablet 3   rosuvastatin  (CRESTOR ) 10 MG tablet Take 1 tablet (10 mg total) by mouth daily. 90 tablet 3   tamsulosin  (FLOMAX ) 0.4 MG CAPS capsule Take 1 capsule (0.4 mg total) by mouth daily. 90 capsule 3   thiamine (VITAMIN B-1) 100 MG tablet Take 100 mg by mouth daily.     trandolapril  (MAVIK ) 4 MG tablet Take 1 tablet (4 mg total) by mouth daily. 90 tablet 1   omeprazole  (PRILOSEC) 40 MG capsule Take 1 capsule (40 mg total) by mouth daily. 90 capsule 1   clotrimazole -betamethasone  (LOTRISONE ) cream Apply 1 Application topically daily. (Patient not taking:  Reported on 10/18/2023) 30 g 1   triamcinolone  cream (KENALOG ) 0.1 % Apply topically 2 (two) times daily. (Patient not taking: Reported on 10/18/2023) 80 g 0   No facility-administered medications prior to visit.    ROS: Review of Systems  Constitutional:  Negative for appetite change, fatigue and unexpected weight change.  HENT:  Negative for congestion, nosebleeds, sneezing, sore throat and trouble swallowing.   Eyes:  Negative for itching and visual disturbance.  Respiratory:  Negative for cough.   Cardiovascular:  Negative for chest pain, palpitations and leg swelling.  Gastrointestinal:  Negative for abdominal distention, blood in stool, diarrhea and nausea.  Genitourinary:  Negative for frequency and hematuria.  Musculoskeletal:  Positive for back pain. Negative for gait problem, joint swelling and neck  pain.  Skin:  Negative for rash.  Neurological:  Negative for dizziness, tremors, speech difficulty and weakness.  Psychiatric/Behavioral:  Negative for agitation, dysphoric mood and sleep disturbance. The patient is not nervous/anxious.     Objective:  BP 130/78   Pulse 92   Temp 98.3 F (36.8 C) (Oral)   Ht 5' 8 (1.727 m)   Wt 177 lb 3.2 oz (80.4 kg)   SpO2 98%   BMI 26.94 kg/m   BP Readings from Last 3 Encounters:  10/18/23 130/78  09/11/23 124/70  08/28/23 110/70    Wt Readings from Last 3 Encounters:  10/18/23 177 lb 3.2 oz (80.4 kg)  09/11/23 173 lb (78.5 kg)  08/28/23 182 lb 9.6 oz (82.8 kg)    Physical Exam Constitutional:      General: He is not in acute distress.    Appearance: Normal appearance. He is well-developed.     Comments: NAD  Eyes:     Conjunctiva/sclera: Conjunctivae normal.     Pupils: Pupils are equal, round, and reactive to light.  Neck:     Thyroid : No thyromegaly.     Vascular: No JVD.  Cardiovascular:     Rate and Rhythm: Normal rate and regular rhythm.     Heart sounds: Normal heart sounds. No murmur heard.    No friction rub. No gallop.  Pulmonary:     Effort: Pulmonary effort is normal. No respiratory distress.     Breath sounds: Normal breath sounds. No wheezing or rales.  Chest:     Chest wall: No tenderness.  Abdominal:     General: Bowel sounds are normal. There is no distension.     Palpations: Abdomen is soft. There is no mass.     Tenderness: There is no abdominal tenderness. There is no guarding or rebound.  Musculoskeletal:        General: No tenderness. Normal range of motion.     Cervical back: Normal range of motion.     Right lower leg: No edema.     Left lower leg: No edema.  Lymphadenopathy:     Cervical: No cervical adenopathy.  Skin:    General: Skin is warm and dry.     Findings: No rash.  Neurological:     Mental Status: He is alert and oriented to person, place, and time.     Cranial Nerves: No  cranial nerve deficit.     Motor: No abnormal muscle tone.     Coordination: Coordination normal.     Gait: Gait normal.     Deep Tendon Reflexes: Reflexes are normal and symmetric.  Psychiatric:        Behavior: Behavior normal.  Thought Content: Thought content normal.        Judgment: Judgment normal.     Lab Results  Component Value Date   WBC 4.7 08/28/2023   HGB 13.5 08/28/2023   HCT 41.0 08/28/2023   PLT 109.0 (L) 08/28/2023   GLUCOSE 112 (H) 10/15/2023   CHOL 127 04/11/2023   TRIG 98.0 04/11/2023   HDL 61.70 04/11/2023   LDLDIRECT 105.5 09/10/2013   LDLCALC 46 04/11/2023   ALT 15 10/15/2023   AST 18 10/15/2023   NA 134 (L) 10/15/2023   K 4.5 10/15/2023   CL 97 10/15/2023   CREATININE 1.78 (H) 10/15/2023   BUN 25 (H) 10/15/2023   CO2 26 10/15/2023   TSH 4.81 06/25/2023   PSA 0.89 04/11/2023   HGBA1C 7.1 (H) 10/15/2023    No results found.  Assessment & Plan:   Problem List Items Addressed This Visit     BPH (benign prostatic hyperplasia)   Cont on Flomax  Add Cialis  5 mg/d      Relevant Orders   PSA   CRI (chronic renal insufficiency), stage 3 (moderate)   On Mavik  Avoid NSAIDs F/u w/Nephrology       Relevant Orders   Comprehensive metabolic panel with GFR   DM2 (diabetes mellitus, type 2) (HCC) - Primary   A little better Improve diet      Relevant Orders   Hemoglobin A1c   Essential hypertension   SBP is 120-130 at home Cont on Amlodipine , Mavik       Relevant Medications   tadalafil  (CIALIS ) 5 MG tablet   GERD (gastroesophageal reflux disease)   Relevant Medications   omeprazole  (PRILOSEC) 40 MG capsule   Gout   Cont on allopurinol  dose 50 mg a day       Other Visit Diagnoses       Immunization due       Relevant Orders   Flu vaccine HIGH DOSE PF(Fluzone Trivalent)         Meds ordered this encounter  Medications   omeprazole  (PRILOSEC) 40 MG capsule    Sig: Take 1 capsule (40 mg total) by mouth daily.     Dispense:  90 capsule    Refill:  3   tadalafil  (CIALIS ) 5 MG tablet    Sig: Take 1 tablet (5 mg total) by mouth daily.    Dispense:  30 tablet    Refill:  11      Follow-up: Return in about 3 months (around 01/17/2024) for a follow-up visit.  Marolyn Noel, MD

## 2023-10-18 NOTE — Patient Instructions (Addendum)
For a mild COVID-19 case - take zinc 50 mg a day for 1 week, vitamin C 1000 mg daily for 1 week, vitamin D2 50,000 units weekly for 2 months (unless  taking vitamin D daily already), an antioxidant Quercetin 500 mg twice a day for 1 week (if you can get it quick enough). Take Allegra or Benadryl.  Maintain good oral hydration and take Tylenol for high fever.  Call if problems.

## 2023-10-18 NOTE — Assessment & Plan Note (Signed)
 SBP is 120-130 at home Cont on Amlodipine , Mavik 

## 2023-10-24 DIAGNOSIS — N1832 Chronic kidney disease, stage 3b: Secondary | ICD-10-CM | POA: Diagnosis not present

## 2023-10-29 ENCOUNTER — Telehealth: Payer: Self-pay

## 2023-10-29 DIAGNOSIS — N182 Chronic kidney disease, stage 2 (mild): Secondary | ICD-10-CM | POA: Diagnosis not present

## 2023-10-29 DIAGNOSIS — Z23 Encounter for immunization: Secondary | ICD-10-CM | POA: Diagnosis not present

## 2023-10-29 DIAGNOSIS — N183 Chronic kidney disease, stage 3 unspecified: Secondary | ICD-10-CM | POA: Diagnosis not present

## 2023-10-29 DIAGNOSIS — I10 Essential (primary) hypertension: Secondary | ICD-10-CM | POA: Diagnosis not present

## 2023-10-29 DIAGNOSIS — K2100 Gastro-esophageal reflux disease with esophagitis, without bleeding: Secondary | ICD-10-CM | POA: Diagnosis not present

## 2023-10-29 DIAGNOSIS — M1000 Idiopathic gout, unspecified site: Secondary | ICD-10-CM | POA: Diagnosis not present

## 2023-10-29 DIAGNOSIS — E1122 Type 2 diabetes mellitus with diabetic chronic kidney disease: Secondary | ICD-10-CM | POA: Diagnosis not present

## 2023-10-29 DIAGNOSIS — N400 Enlarged prostate without lower urinary tract symptoms: Secondary | ICD-10-CM | POA: Diagnosis not present

## 2023-10-29 NOTE — Telephone Encounter (Signed)
-----   Message from [facility] sent at 10/28/2023 10:13 PM EDT ----- Douglas Richards, Please sign his immunization entry Thanks

## 2023-10-29 NOTE — Telephone Encounter (Signed)
 Vaccine immunization completed. Chart should be good to be closed now.

## 2023-11-01 DIAGNOSIS — N1832 Chronic kidney disease, stage 3b: Secondary | ICD-10-CM | POA: Diagnosis not present

## 2023-11-01 DIAGNOSIS — D631 Anemia in chronic kidney disease: Secondary | ICD-10-CM | POA: Diagnosis not present

## 2023-11-01 DIAGNOSIS — E871 Hypo-osmolality and hyponatremia: Secondary | ICD-10-CM | POA: Diagnosis not present

## 2023-11-01 DIAGNOSIS — I129 Hypertensive chronic kidney disease with stage 1 through stage 4 chronic kidney disease, or unspecified chronic kidney disease: Secondary | ICD-10-CM | POA: Diagnosis not present

## 2023-11-01 DIAGNOSIS — N2581 Secondary hyperparathyroidism of renal origin: Secondary | ICD-10-CM | POA: Diagnosis not present

## 2023-11-01 DIAGNOSIS — E1122 Type 2 diabetes mellitus with diabetic chronic kidney disease: Secondary | ICD-10-CM | POA: Diagnosis not present

## 2023-11-11 ENCOUNTER — Other Ambulatory Visit (HOSPITAL_COMMUNITY): Payer: Self-pay

## 2023-11-13 ENCOUNTER — Other Ambulatory Visit: Payer: Self-pay

## 2023-11-18 ENCOUNTER — Other Ambulatory Visit (HOSPITAL_COMMUNITY): Payer: Self-pay

## 2023-11-19 ENCOUNTER — Other Ambulatory Visit: Payer: Self-pay

## 2023-11-20 ENCOUNTER — Other Ambulatory Visit: Payer: Self-pay | Admitting: Internal Medicine

## 2023-11-20 ENCOUNTER — Other Ambulatory Visit: Payer: Self-pay

## 2023-11-20 ENCOUNTER — Other Ambulatory Visit (HOSPITAL_COMMUNITY): Payer: Self-pay

## 2023-11-22 ENCOUNTER — Encounter: Payer: Self-pay | Admitting: Internal Medicine

## 2023-11-22 ENCOUNTER — Ambulatory Visit: Admitting: Internal Medicine

## 2023-11-22 VITALS — BP 136/78 | HR 90 | Ht 65.0 in | Wt 178.0 lb

## 2023-11-22 DIAGNOSIS — E119 Type 2 diabetes mellitus without complications: Secondary | ICD-10-CM

## 2023-11-22 DIAGNOSIS — Z860100 Personal history of colon polyps, unspecified: Secondary | ICD-10-CM | POA: Diagnosis not present

## 2023-11-22 DIAGNOSIS — K219 Gastro-esophageal reflux disease without esophagitis: Secondary | ICD-10-CM

## 2023-11-22 MED ORDER — NA SULFATE-K SULFATE-MG SULF 17.5-3.13-1.6 GM/177ML PO SOLN
1.0000 | Freq: Once | ORAL | 0 refills | Status: AC
  Filled 2023-11-22: qty 354, 1d supply, fill #0

## 2023-11-22 NOTE — Progress Notes (Signed)
 HISTORY OF PRESENT ILLNESS:  Douglas Richards is a 78 y.o. male with multiple medical problems as listed below including diabetes mellitus.  He presents today regarding surveillance colonoscopy.  Patient has a history of multiple advanced adenomatous colon polyps and has undergone multiple colonoscopies over the years.  Last colonoscopy June 2022.  Found to have 11 polyps.  Told to follow-up in 1 year.  Did not.  Has had difficulties getting his bowel prep well.  Patient does have chronic constipation.  He also has a history of GERD which is controlled with omeprazole .  GI review of systems is negative.  Review of blood work from October 15, 2023 shows creatinine of 1.78.  Normal liver test.  Unremarkable CBC with hemoglobin 13.5.  CT scan September 2024 revealed inguinal hernias.  These have been repaired.  REVIEW OF SYSTEMS:  All non-GI ROS negative except for arthritis, cough, hearing problems, itching, muscle cramps, sleeping problems, urinary frequency, excessive urination  Past Medical History:  Diagnosis Date   Allergic rhinitis    Allergy    Anal fissure    Anemia    Cataract    right eye    Chronic kidney disease    Stage 3   Diabetes mellitus 02/06/2009   type II   Diverticulosis of colon (without mention of hemorrhage)    GERD (gastroesophageal reflux disease)    Gout, unspecified    Herpes zoster without mention of complication    Hyperlipidemia    Hypertension    Kidney disease    Osteoarthrosis, unspecified whether generalized or localized, unspecified site    Other abnormal glucose    Personal history of colonic polyps    Urticaria     Past Surgical History:  Procedure Laterality Date   CATARACT EXTRACTION Bilateral    COLONOSCOPY  01/15/2014   HEMORRHOID SURGERY     HERNIA REPAIR N/A 10/29/2022   Dr Cordella Moder - [facility]   LIPOMA EXCISION     from the neck   POLYPECTOMY     rotator cuff surgery Right    TONSILLECTOMY     TONSILLECTOMY       Social History Douglas Richards  reports that he has never smoked. He has never been exposed to tobacco smoke. He has never used smokeless tobacco. He reports that he does not currently use alcohol. He reports that he does not use drugs.  family history includes Alcohol abuse in his brother and father; Arthritis in his brother; COPD in his father and mother; Diabetes in his father; Heart disease in his brother and sister; Hypertension in his father, mother, and another family member; Stroke in his father and mother.  Allergies  Allergen Reactions   Atorvastatin Other (See Comments)    Muscle/joint aches   Prednisolone Swelling    Red swollen face He had cortisone knee shot and was ok   Sulfonamide Derivatives Nausea Only   Trulicity  [Dulaglutide ]     ?pancreatitis       PHYSICAL EXAMINATION: Vital signs: BP 136/78   Pulse 90   Ht 5' 5 (1.651 m)   Wt 178 lb (80.7 kg)   BMI 29.62 kg/m   Constitutional: generally well-appearing, no acute distress Psychiatric: alert and oriented x3, cooperative Eyes: extraocular movements intact, anicteric, conjunctiva pink Mouth: oral pharynx moist, no lesions Neck: supple no lymphadenopathy Cardiovascular: heart regular rate and rhythm, no murmur Lungs: clear to auscultation bilaterally Abdomen: soft, nontender, nondistended, no obvious ascites, no peritoneal signs, normal bowel sounds,  no organomegaly Rectal: Deferred until colonoscopy Extremities: no clubbing, cyanosis, or lower extremity edema bilaterally Skin: no lesions on visible extremities Neuro: No focal deficits.  Cranial nerves intact  ASSESSMENT:  1.  History of multiple advanced adenomatous colon polyps.  Overdue for surveillance.  Appropriate candidate without contraindication. 2.  Chronic constipation.  Difficulty prepping adequately 3.  GERD.  On PPI.  Controlled. 4.  Multiple general medical problems, including diabetes.  Stable.   PLAN:  1.  Colonoscopy.  The  nature of the procedure, as well as the risks, benefits, and alternatives were carefully and thoroughly reviewed with the patient. Ample time for discussion and questions allowed. The patient understood, was satisfied, and agreed to proceed. 2.  2-day prep 4.  Hold diabetic medications the day of the procedure to avoid unwanted hypoglycemia 5.  Reflux precautions 6.  Continue PPI 7.  Ongoing general medical care with Dr. Garald A total time of 45 minutes was spent preparing to see the patient, obtaining comprehensive history, performing medically appropriate physical examination, counseling and educating the patient regarding above listed issues, ordering colonoscopy, adjusting medications, and documenting clinical information in the health record

## 2023-11-22 NOTE — Patient Instructions (Signed)
 Make sure to avoid fiber and roughage for 5 days prior to the procedure.  You have been scheduled for a colonoscopy. Please follow written instructions given to you at your visit today.   If you use inhalers (even only as needed), please bring them with you on the day of your procedure.  DO NOT TAKE 7 DAYS PRIOR TO TEST- Trulicity  (dulaglutide ) Ozempic, Wegovy (semaglutide) Mounjaro (tirzepatide) Bydureon  Bcise (exanatide extended release)  DO NOT TAKE 1 DAY PRIOR TO YOUR TEST Rybelsus (semaglutide) Adlyxin (lixisenatide) Victoza (liraglutide) Byetta  (exanatide) ___________________________________________________________________________  _______________________________________________________  If your blood pressure at your visit was 140/90 or greater, please contact your primary care physician to follow up on this.  _______________________________________________________  If you are age 5 or older, your body mass index should be between 23-30. Your Body mass index is 29.62 kg/m. If this is out of the aforementioned range listed, please consider follow up with your Primary Care Provider.  If you are age 74 or younger, your body mass index should be between 19-25. Your Body mass index is 29.62 kg/m. If this is out of the aformentioned range listed, please consider follow up with your Primary Care Provider.   ________________________________________________________  The [facility] providers would like to encourage you to use MYCHART to communicate with providers for non-urgent requests or questions.  Due to long hold times on the telephone, sending your provider a message by [facility] may be a faster and more efficient way to get a response.  Please allow 48 business hours for a response.  Please remember that this is for non-urgent requests.  _______________________________________________________  [facility] is using a team-based approach to care.  Your team is made  up of your doctor and two to three APPS. Our APPS (Nurse Practitioners and Physician Assistants) work with your physician to ensure care continuity for you. They are fully qualified to address your health concerns and develop a treatment plan. They communicate directly with your gastroenterologist to care for you. Seeing the Advanced Practice Practitioners on your physician's team can help you by facilitating care more promptly, often allowing for earlier appointments, access to diagnostic testing, procedures, and other specialty referrals.

## 2023-11-23 ENCOUNTER — Other Ambulatory Visit: Payer: Self-pay

## 2023-11-23 ENCOUNTER — Other Ambulatory Visit (HOSPITAL_COMMUNITY): Payer: Self-pay

## 2023-11-23 MED ORDER — METHOCARBAMOL 500 MG PO TABS
500.0000 mg | ORAL_TABLET | Freq: Three times a day (TID) | ORAL | 1 refills | Status: AC | PRN
  Filled 2023-11-23: qty 30, 10d supply, fill #0
  Filled 2023-12-28: qty 30, 10d supply, fill #1

## 2023-12-02 ENCOUNTER — Other Ambulatory Visit: Payer: Self-pay | Admitting: Internal Medicine

## 2023-12-03 ENCOUNTER — Other Ambulatory Visit: Payer: Self-pay

## 2023-12-03 ENCOUNTER — Other Ambulatory Visit (HOSPITAL_COMMUNITY): Payer: Self-pay

## 2023-12-03 MED ORDER — CLOTRIMAZOLE-BETAMETHASONE 1-0.05 % EX CREA
1.0000 | TOPICAL_CREAM | Freq: Every day | CUTANEOUS | 1 refills | Status: AC
  Filled 2023-12-03: qty 30, 30d supply, fill #0
  Filled 2024-01-02: qty 30, 30d supply, fill #1

## 2023-12-09 ENCOUNTER — Other Ambulatory Visit: Payer: Self-pay | Admitting: Family Medicine

## 2023-12-09 DIAGNOSIS — L509 Urticaria, unspecified: Secondary | ICD-10-CM

## 2023-12-09 MED ORDER — FAMOTIDINE 20 MG PO TABS
20.0000 mg | ORAL_TABLET | Freq: Two times a day (BID) | ORAL | 1 refills | Status: DC
  Filled 2023-12-09: qty 60, 30d supply, fill #0
  Filled 2024-01-13: qty 60, 30d supply, fill #1

## 2023-12-10 ENCOUNTER — Other Ambulatory Visit: Payer: Self-pay

## 2023-12-10 ENCOUNTER — Other Ambulatory Visit (HOSPITAL_COMMUNITY): Payer: Self-pay

## 2023-12-13 ENCOUNTER — Encounter: Payer: Self-pay | Admitting: Allergy

## 2023-12-13 ENCOUNTER — Other Ambulatory Visit: Payer: Self-pay

## 2023-12-13 ENCOUNTER — Other Ambulatory Visit (HOSPITAL_COMMUNITY): Payer: Self-pay

## 2023-12-13 ENCOUNTER — Ambulatory Visit: Admitting: Allergy

## 2023-12-13 VITALS — BP 118/74 | HR 94 | Temp 98.3°F | Resp 16 | Ht 68.0 in | Wt 176.0 lb

## 2023-12-13 DIAGNOSIS — L309 Dermatitis, unspecified: Secondary | ICD-10-CM | POA: Diagnosis not present

## 2023-12-13 DIAGNOSIS — L299 Pruritus, unspecified: Secondary | ICD-10-CM | POA: Diagnosis not present

## 2023-12-13 MED ORDER — LEVOCETIRIZINE DIHYDROCHLORIDE 5 MG PO TABS
5.0000 mg | ORAL_TABLET | Freq: Every evening | ORAL | 5 refills | Status: AC
  Filled 2023-12-13: qty 30, 30d supply, fill #0
  Filled 2024-02-10: qty 30, 30d supply, fill #1

## 2023-12-13 MED ORDER — METHYLPREDNISOLONE 4 MG PO TBPK
ORAL_TABLET | ORAL | 0 refills | Status: AC
  Filled 2023-12-13: qty 21, 6d supply, fill #0

## 2023-12-13 MED ORDER — TRIAMCINOLONE ACETONIDE 0.1 % EX OINT
1.0000 | TOPICAL_OINTMENT | Freq: Two times a day (BID) | CUTANEOUS | 1 refills | Status: AC | PRN
  Filled 2023-12-13: qty 80, 30d supply, fill #0

## 2023-12-13 NOTE — Patient Instructions (Addendum)
 Rash Keep track of rashes and take pictures. Continue proper skin care. Start medrol  pak. If you notice any issues with this stop taking it and let us  know.   Start levocetirizine 5mg  daily at night. If symptoms are not controlled or causes drowsiness let us  know. Stop loratadine.  Continue  Pepcid  (famotidine ) 20mg  twice a day.  Avoid the following potential triggers: alcohol, tight clothing, NSAIDs, hot showers and getting overheated. Use triamcinolone  0.1% ointment twice a day as needed for rash flares. Do not use on the face, neck, armpits or groin area. Do not use more than 3 weeks in a row.  May take hydroxyzine  25mg  twice a day as needed for breakthrough itching/rash.  Get bloodwork We are ordering labs, so please allow 1-2 weeks for the results to come back. With the newly implemented Cures Act, the labs might be visible to you at the same time that they become visible to me. However, I will not address the results until all of the results are back, so please be patient.  In the meantime, continue recommendations in your patient instructions, including avoidance measures (if applicable), until you hear from me.   Follow up in 1 month or sooner if needed.   Skin care recommendations  Bath time: Always use lukewarm water. AVOID very hot or cold water. Keep bathing time to 5-10 minutes. Do NOT use bubble bath. Use a mild soap and use just enough to wash the dirty areas. Do NOT scrub skin vigorously.  After bathing, pat dry your skin with a towel. Do NOT rub or scrub the skin.  Moisturizers and prescriptions:  ALWAYS apply moisturizers immediately after bathing (within 3 minutes). This helps to lock-in moisture. Use the moisturizer several times a day over the whole body. Good summer moisturizers include: Aveeno, CeraVe, Cetaphil. Good winter moisturizers include: Aquaphor, Vaseline, Cerave, Cetaphil, Eucerin, Vanicream. When using moisturizers along with medications, the  moisturizer should be applied about one hour after applying the medication to prevent diluting effect of the medication or moisturize around where you applied the medications. When not using medications, the moisturizer can be continued twice daily as maintenance.  Laundry and clothing: Avoid laundry products with added color or perfumes. Use unscented hypo-allergenic laundry products such as Tide free, Cheer free & gentle, and All free and clear.  If the skin still seems dry or sensitive, you can try double-rinsing the clothes. Avoid tight or scratchy clothing such as wool. Do not use fabric softeners or dyer sheets.

## 2023-12-13 NOTE — Progress Notes (Signed)
 Follow Up Note  RE: Douglas Richards MRN: [id number] DOB: October 22, 1945 Date of Office Visit: 12/13/2023  Referring provider: Garald Karlynn GAILS, MD Primary care provider: Garald Karlynn GAILS, MD  Chief Complaint: Follow-up (He states he is having itchy since beginning of October on back, legs and arms. )  History of Present Illness: I had the pleasure of seeing Douglas Richards for a follow up visit at the [facility] on 12/13/2023. He is a 78 y.o. male, who is being followed for dermatitis. His previous allergy office visit was on 06/27/2023 with Dr. Luke. Today is a new complaint visit of itching.  Discussed the use of AI scribe software for clinical note transcription with the patient, who gave verbal consent to proceed.  History of Present Illness   He has been experiencing recurrent episodes of itching, primarily affecting his arms and back, with the current episode starting in early October. The itching initially resolved after a course of steroids prescribed in May but recurred. No changes in diet, medications, or lotions that could have triggered the itching. The itching intensifies upon touching the affected areas, and he often wakes up itching. No recent fevers, chills, changes in appetite, bowel habits, new rashes, or insect bites.  He has been using cortisone cream with aloe for relief, which provides only short-term relief. He is currently taking loratadine, famotidine , Pepcid , and hydroxyzine , each at a dose of one in the morning and one at night. The hydroxyzine  does not cause drowsiness.  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 His diabetes history includes an A1c of 7, and he undergoes regular blood work every three months, with the last one in September which was before the current onset of pruritus.     2025  labs: Chronic urticaria index (checks for autoantibodies that trigger mast cells), tryptase (checks for mast cell issues) and alpha gal (checks for red meat allergy) were all normal which is great.    Your autoimmune screener was slightly positive at 1:80.    If you are not better at the next visit, I'll refer you to rheumatology next for further evaluation.  Assessment and Plan: [name] is a 78 y.o. male with: Pruritus  Dermatitis Past history - persistent pruritic rash with no clear trigger. Prednisone  provided temporary relief. PMHx significant for CKD, HTN, DM, HLP.  Interim history - resolved a few weeks after last visit in May and no other flare until about 1 month ago. No trigger noted. Keep track of rashes and take pictures. Continue proper skin care. Start medrol  pak. If you notice any issues with this stop taking it and let us  know.  Start levocetirizine 5mg  daily at night. If symptoms are not controlled or causes drowsiness let us  know. Stop loratadine.  Continue  Pepcid  (famotidine ) 20mg  twice a day.  Avoid the following potential triggers: alcohol, tight clothing, NSAIDs, hot showers and getting overheated. Use triamcinolone  0.1% ointment twice a day as needed for rash flares. Do not use on the face, neck, armpits or groin area. Do not use more than 3 weeks in a row.  May take hydroxyzine  25mg  twice a day as needed for breakthrough itching/rash.  Get bloodwork  Return in about 4 weeks (around 01/10/2024).  Meds ordered this encounter  Medications   methylPREDNISolone  (MEDROL  DOSEPAK) 4 MG TBPK tablet  Sig: Take 6 tablets by mouth daily for 1 day, THEN 5 tablets daily for 1 day, THEN 4 tablets daily for 1 day, THEN 3 tablets daily for 1 day, THEN 2 tablets daily for 1 day, THEN 1 tablet daily for 1 day.    Dispense:  21 tablet    Refill:  0   triamcinolone  ointment (KENALOG ) 0.1 %    Sig: Apply 1 Application topically 2 (two) times daily as needed (rash flare). Do not  use on the face, neck, armpits or groin area. Do not use more than 3 weeks in a row.    Dispense:  80 g    Refill:  1   levocetirizine (XYZAL ) 5 MG tablet    Sig: Take 1 tablet (5 mg total) by mouth every evening.    Dispense:  30 tablet    Refill:  5   Lab Orders         Alpha-Gal Panel         CBC with Differential/Platelet         Comprehensive metabolic panel with GFR         Thyroid  Cascade Profile      Diagnostics: None.    Medication List:  Current Outpatient Medications  Medication Sig Dispense Refill   allopurinol  (ZYLOPRIM ) 100 MG tablet Take 1/2 tablet (50 mg total) by mouth daily. 90 tablet 1   amLODipine  (NORVASC ) 5 MG tablet Take 1 tablet (5 mg total) by mouth daily. 90 tablet 1   aspirin  EC 325 MG tablet Take 650 mg by mouth every 6 (six) hours as needed (pain.).     Blood Glucose Monitoring Suppl (ACCU-CHEK GUIDE) w/Device KIT Use to check blood sugar twice daily. 1 kit 1   Cholecalciferol (VITAMIN D3) 1000 UNITS tablet Take 1,000 Units by mouth every evening.     clotrimazole -betamethasone  (LOTRISONE ) cream Apply 1 Application topically daily. 30 g 1   docusate sodium (COLACE) 100 MG capsule Take 100 mg by mouth 2 (two) times daily.     famotidine  (PEPCID ) 20 MG tablet Take 1 tablet (20 mg total) by mouth 2 (two) times daily. 60 tablet 1   glucose blood (FREESTYLE LITE) test strip USE TO CHECK BLOOD SUGAR TWICE DAILY 100 strip 5   glucose blood test strip Use as instructed twice daily 100 each 11   HYDROcodone -acetaminophen  (NORCO/VICODIN) 5-325 MG tablet Take 1 tablet by mouth every 6 (six) hours as needed for severe pain (pain score 7-10). 120 tablet 0   Lancets (ONETOUCH DELICA PLUS LANCET33G) MISC Use as directed to check blood sugar twice daily. 100 each 12   levocetirizine (XYZAL ) 5 MG tablet Take 1 tablet (5 mg total) by mouth every evening. 30 tablet 5   metFORMIN  (GLUCOPHAGE ) 500 MG tablet Take 1 tablet (500 mg total) by mouth 2 (two) times daily with a  meal. 180 tablet 3   methocarbamol  (ROBAXIN ) 500 MG tablet Take 1 tablet (500 mg total) by mouth every 8 (eight) hours as needed. 30 tablet 1   methylPREDNISolone  (MEDROL  DOSEPAK) 4 MG TBPK tablet Take 6 tablets by mouth daily for 1 day, THEN 5 tablets daily for 1 day, THEN 4 tablets daily for 1 day, THEN 3 tablets daily for 1 day, THEN 2 tablets daily for 1 day, THEN 1 tablet daily for 1 day. 21 tablet 0   MINERAL OIL PO Take 15 mLs by mouth in the morning.     omeprazole  (PRILOSEC) 40 MG capsule Take 1 capsule (  40 mg total) by mouth daily. 90 capsule 3   ondansetron  (ZOFRAN -ODT) 4 MG disintegrating tablet Take 1 tablet (4 mg total) by mouth every 8 (eight) hours as needed for nausea or vomiting 30 tablet 1   Polyethyl Glycol-Propyl Glycol (LUBRICANT EYE DROPS) 0.4-0.3 % SOLN Place 1-2 drops into both eyes See admin instructions. Instill 1 drop into both eyes scheduled every morning & every 2 hours as needed for dry/irritated eyes.     repaglinide  (PRANDIN ) 1 MG tablet Take 1 tablet (1 mg total) by mouth 3 (three) times daily before meals. 270 tablet 3   rosuvastatin  (CRESTOR ) 10 MG tablet Take 1 tablet (10 mg total) by mouth daily. 90 tablet 3   tadalafil  (CIALIS ) 5 MG tablet Take 1 tablet (5 mg total) by mouth daily. 30 tablet 11   tamsulosin  (FLOMAX ) 0.4 MG CAPS capsule Take 1 capsule (0.4 mg total) by mouth daily. 90 capsule 3   thiamine (VITAMIN B-1) 100 MG tablet Take 100 mg by mouth daily.     trandolapril  (MAVIK ) 4 MG tablet Take 1 tablet (4 mg total) by mouth daily. 90 tablet 1   triamcinolone  ointment (KENALOG ) 0.1 % Apply 1 Application topically 2 (two) times daily as needed (rash flare). Do not use on the face, neck, armpits or groin area. Do not use more than 3 weeks in a row. 80 g 1   No current facility-administered medications for this visit.   Allergies: Allergies  Allergen Reactions   Atorvastatin Other (See Comments)    Muscle/joint aches   Prednisolone Swelling    Red  swollen face He had cortisone knee shot and was ok   Sulfonamide Derivatives Nausea Only   Trulicity  [Dulaglutide ]     ?pancreatitis   I reviewed his past medical history, social history, family history, and environmental history and no significant changes have been reported from his previous visit.  Review of Systems  Constitutional:  Negative for appetite change, chills, fever and unexpected weight change.  HENT:  Negative for congestion and rhinorrhea.   Eyes:  Negative for itching.  Respiratory:  Negative for cough, chest tightness, shortness of breath and wheezing.   Cardiovascular:  Negative for chest pain.  Gastrointestinal:  Negative for abdominal pain.  Genitourinary:  Negative for difficulty urinating.  Skin:  Positive for rash.       pruritus  Neurological:  Negative for headaches.    Objective: BP 118/74 (BP Location: Right Arm, Patient Position: Sitting, Cuff Size: Normal)   Pulse 94   Temp 98.3 F (36.8 C) (Temporal)   Resp 16   Ht 5' 8 (1.727 m)   Wt 176 lb (79.8 kg)   SpO2 99%   BMI 26.76 kg/m  Body mass index is 26.76 kg/m. Physical Exam Vitals and nursing note reviewed.  Constitutional:      Appearance: Normal appearance. He is well-developed.  HENT:     Head: Normocephalic and atraumatic.     Right Ear: Tympanic membrane and external ear normal.     Left Ear: Tympanic membrane and external ear normal.     Nose: Nose normal.     Mouth/Throat:     Mouth: Mucous membranes are moist.     Pharynx: Oropharynx is clear.  Eyes:     Conjunctiva/sclera: Conjunctivae normal.  Cardiovascular:     Rate and Rhythm: Normal rate and regular rhythm.     Heart sounds: Normal heart sounds. No murmur heard.    No friction rub. No gallop.  Pulmonary:     Effort: Pulmonary effort is normal.     Breath sounds: Normal breath sounds. No wheezing, rhonchi or rales.  Musculoskeletal:     Cervical back: Neck supple.  Skin:    General: Skin is warm.     Findings:  Rash present.     Comments: Few areas of erythema on the upper extremities, some bruising on left forearm area.  Neurological:     Mental Status: He is alert and oriented to person, place, and time.  Psychiatric:        Behavior: Behavior normal.    Previous notes and tests were reviewed. The plan was reviewed with the patient/family, and all questions/concerned were addressed.  It was my pleasure to see Attilio today and participate in his care. Please feel free to contact me with any questions or concerns.  Sincerely,  Orlan Cramp, DO Allergy & Immunology  [facility]  [place] office: [phone] [facility] [place] office: [phone]

## 2023-12-14 DIAGNOSIS — L299 Pruritus, unspecified: Secondary | ICD-10-CM | POA: Diagnosis not present

## 2023-12-14 DIAGNOSIS — L309 Dermatitis, unspecified: Secondary | ICD-10-CM | POA: Diagnosis not present

## 2023-12-18 LAB — COMPREHENSIVE METABOLIC PANEL WITH GFR
ALT: 18 IU/L (ref 0–44)
AST: 20 IU/L (ref 0–40)
Albumin: 4.5 g/dL (ref 3.8–4.8)
Alkaline Phosphatase: 63 IU/L (ref 47–123)
BUN/Creatinine Ratio: 19 (ref 10–24)
BUN: 39 mg/dL — ABNORMAL HIGH (ref 8–27)
Bilirubin Total: 0.3 mg/dL (ref 0.0–1.2)
CO2: 17 mmol/L — ABNORMAL LOW (ref 20–29)
Calcium: 10 mg/dL (ref 8.6–10.2)
Chloride: 99 mmol/L (ref 96–106)
Creatinine, Ser: 2.09 mg/dL — ABNORMAL HIGH (ref 0.76–1.27)
Globulin, Total: 2.4 g/dL (ref 1.5–4.5)
Glucose: 139 mg/dL — ABNORMAL HIGH (ref 70–99)
Potassium: 5.3 mmol/L — ABNORMAL HIGH (ref 3.5–5.2)
Sodium: 134 mmol/L (ref 134–144)
Total Protein: 6.9 g/dL (ref 6.0–8.5)
eGFR: 32 mL/min/1.73 — ABNORMAL LOW (ref >59)

## 2023-12-18 LAB — CBC WITH DIFFERENTIAL/PLATELET
Basophils Absolute: 0.1 x10E3/uL (ref 0.0–0.2)
Basos: 1 %
EOS (ABSOLUTE): 0.3 x10E3/uL (ref 0.0–0.4)
Eos: 4 %
Hematocrit: 39.1 % (ref 37.5–51.0)
Hemoglobin: 12.8 g/dL — ABNORMAL LOW (ref 13.0–17.7)
Immature Grans (Abs): 0 x10E3/uL (ref 0.0–0.1)
Immature Granulocytes: 0 %
Lymphocytes Absolute: 1.6 x10E3/uL (ref 0.7–3.1)
Lymphs: 19 %
MCH: 30.1 pg (ref 26.6–33.0)
MCHC: 32.7 g/dL (ref 31.5–35.7)
MCV: 92 fL (ref 79–97)
Monocytes Absolute: 0.5 x10E3/uL (ref 0.1–0.9)
Monocytes: 6 %
Neutrophils Absolute: 5.8 x10E3/uL (ref 1.4–7.0)
Neutrophils: 70 %
Platelets: 170 x10E3/uL (ref 150–450)
RBC: 4.25 x10E6/uL (ref 4.14–5.80)
RDW: 12.5 % (ref 11.6–15.4)
WBC: 8.3 x10E3/uL (ref 3.4–10.8)

## 2023-12-18 LAB — THYROID CASCADE PROFILE: TSH: 3.1 u[IU]/mL (ref 0.450–4.500)

## 2023-12-18 LAB — ALPHA-GAL PANEL
Allergen Lamb IgE: 0.12 kU/L — AB
Beef IgE: 0.24 kU/L — AB
IgE (Immunoglobulin E), Serum: 36 [IU]/mL (ref 6–495)
O215-IgE Alpha-Gal: 1.03 kU/L — AB
Pork IgE: <0.1 kU/L

## 2023-12-19 ENCOUNTER — Ambulatory Visit: Payer: Self-pay | Admitting: Allergy

## 2023-12-27 ENCOUNTER — Other Ambulatory Visit (HOSPITAL_COMMUNITY): Payer: Self-pay

## 2023-12-27 ENCOUNTER — Ambulatory Visit: Admitting: Allergy

## 2023-12-27 ENCOUNTER — Other Ambulatory Visit (HOSPITAL_BASED_OUTPATIENT_CLINIC_OR_DEPARTMENT_OTHER): Payer: Self-pay

## 2023-12-27 ENCOUNTER — Encounter: Payer: Self-pay | Admitting: Allergy

## 2023-12-27 ENCOUNTER — Other Ambulatory Visit: Payer: Self-pay

## 2023-12-27 VITALS — BP 114/70 | HR 85 | Temp 98.8°F | Resp 16 | Ht 68.0 in | Wt 180.0 lb

## 2023-12-27 DIAGNOSIS — L299 Pruritus, unspecified: Secondary | ICD-10-CM | POA: Diagnosis not present

## 2023-12-27 DIAGNOSIS — L309 Dermatitis, unspecified: Secondary | ICD-10-CM | POA: Diagnosis not present

## 2023-12-27 MED ORDER — GABAPENTIN 100 MG PO CAPS
100.0000 mg | ORAL_CAPSULE | Freq: Every evening | ORAL | 1 refills | Status: DC | PRN
  Filled 2023-12-27: qty 30, 30d supply, fill #0
  Filled 2024-01-22 – 2024-01-23 (×2): qty 30, 30d supply, fill #1

## 2023-12-27 NOTE — Progress Notes (Signed)
 Follow Up Note  RE: Douglas Richards MRN: [id number] DOB: 1945-11-13 Date of Office Visit: 12/27/2023  Referring provider: Garald Karlynn GAILS, MD Primary care provider: Plotnikov, Karlynn GAILS, MD  Chief Complaint: Follow-up (He is still itching. )  History of Present Illness: I had the pleasure of seeing Douglas Richards for a follow up visit at the [facility] on 12/27/2023. He is a 78 y.o. male, who is being followed for pruritus and dermatitis. His previous allergy office visit was on 12/13/2023 with Dr. Luke. Today is a new complaint visit of discuss lab results.  Discussed the use of AI scribe software for clinical note transcription with the patient, who gave verbal consent to proceed.    He experiences persistent itching primarily at night, ongoing since September, with severe symptoms on his back. He has tried eliminating mammalian meat from his diet without improvement. He has been using a cream and completed a Medrol  pack about a week ago, which provided minimal relief. Currently, he takes famotidine  twice a day and levocetirizine at night, but these have not alleviated his symptoms. He previously stopped taking loratadine.  He has a history of similar itching episodes last year around September and October, which resolved spontaneously leading him to cancel a previous appointment. The itching is not accompanied by a rash, although he occasionally notices small bumps. He moisturizes his skin daily with CeraVe.  He has a history of kidney issues, with fluctuating kidney function tests, and his last recorded level was just above two. He is under the care of a kidney specialist. He also has diabetes. He is concerned about the potential impact of dehydration from an upcoming colonoscopy on his kidney function.  No rash except for occasional small bumps. No other family members are experiencing similar symptoms. The itching disrupts his sleep, causing him to wake up  scratching.      2025 labs: Your alpha gal level was slightly elevated. Start to avoid all mammalian meat for now (no pork, beef, lamb). This may play a role in your itching and rash.    Your creatinine level was 2.09 due to your kidney issues. Sometimes that can cause itching as well.    Blood counts, thyroid  were normal.    Finish medrol  pak, continue with levocetirizine 5mg  daily and famotidine  20mg  twice a day.  Keep December appointment.  Assessment and Plan: Douglas Richards is a 78 y.o. male with: Pruritus  Dermatitis Past history - persistent pruritic rash with no clear trigger. Prednisone  provided temporary relief. PMHx significant for CKD, HTN, DM, HLP.  Interim history - no improvement with mammalian meat elimination or medrol  pak. Antihistamines ineffective. 2025 labs showed elevated cr.  Keep track of rashes and take pictures. Continue proper skin care. Stop levocetirizine, famotidine  and hydroxyzine  as they have been ineffective.  Avoid the following potential triggers: alcohol, tight clothing, NSAIDs, hot showers and getting overheated. Use triamcinolone  0.1% ointment twice a day as needed for rash flares. Do not use on the face, neck, armpits or groin area. Do not use more than 3 weeks in a row.  May take gabapentin 100mg  1 tablet at night as needed for itching.  Discussed risks/side effects of the medication. Follow up with nephrology regarding kidneys and ask them about colonoscopy prep.  Return if symptoms worsen or fail to improve.  Meds ordered this encounter  Medications   gabapentin (NEURONTIN) 100 MG tablet    Sig: Take 1 tablet (100 mg total) by mouth  at bedtime as needed (itching).    Dispense:  30 tablet    Refill:  1   Lab Orders  No laboratory test(s) ordered today    Diagnostics: None.   Medication List:  Current Outpatient Medications  Medication Sig Dispense Refill   allopurinol  (ZYLOPRIM ) 100 MG tablet Take 1/2 tablet (50 mg total) by mouth  daily. 90 tablet 1   amLODipine  (NORVASC ) 5 MG tablet Take 1 tablet (5 mg total) by mouth daily. 90 tablet 1   aspirin  EC 325 MG tablet Take 650 mg by mouth every 6 (six) hours as needed (pain.).     Blood Glucose Monitoring Suppl (ACCU-CHEK GUIDE) w/Device KIT Use to check blood sugar twice daily. 1 kit 1   Cholecalciferol (VITAMIN D3) 1000 UNITS tablet Take 1,000 Units by mouth every evening.     clotrimazole -betamethasone  (LOTRISONE ) cream Apply 1 Application topically daily. 30 g 1   docusate sodium (COLACE) 100 MG capsule Take 100 mg by mouth 2 (two) times daily.     famotidine  (PEPCID ) 20 MG tablet Take 1 tablet (20 mg total) by mouth 2 (two) times daily. 60 tablet 1   gabapentin (NEURONTIN) 100 MG tablet Take 1 tablet (100 mg total) by mouth at bedtime as needed (itching). 30 tablet 1   glucose blood (FREESTYLE LITE) test strip USE TO CHECK BLOOD SUGAR TWICE DAILY 100 strip 5   glucose blood test strip Use as instructed twice daily 100 each 11   HYDROcodone -acetaminophen  (NORCO/VICODIN) 5-325 MG tablet Take 1 tablet by mouth every 6 (six) hours as needed for severe pain (pain score 7-10). 120 tablet 0   Lancets (ONETOUCH DELICA PLUS LANCET33G) MISC Use as directed to check blood sugar twice daily. 100 each 12   levocetirizine (XYZAL ) 5 MG tablet Take 1 tablet (5 mg total) by mouth every evening. 30 tablet 5   metFORMIN  (GLUCOPHAGE ) 500 MG tablet Take 1 tablet (500 mg total) by mouth 2 (two) times daily with a meal. 180 tablet 3   methocarbamol  (ROBAXIN ) 500 MG tablet Take 1 tablet (500 mg total) by mouth every 8 (eight) hours as needed. 30 tablet 1   MINERAL OIL PO Take 15 mLs by mouth in the morning.     omeprazole  (PRILOSEC) 40 MG capsule Take 1 capsule (40 mg total) by mouth daily. 90 capsule 3   ondansetron  (ZOFRAN -ODT) 4 MG disintegrating tablet Take 1 tablet (4 mg total) by mouth every 8 (eight) hours as needed for nausea or vomiting 30 tablet 1   Polyethyl Glycol-Propyl Glycol  (LUBRICANT EYE DROPS) 0.4-0.3 % SOLN Place 1-2 drops into both eyes See admin instructions. Instill 1 drop into both eyes scheduled every morning & every 2 hours as needed for dry/irritated eyes.     repaglinide  (PRANDIN ) 1 MG tablet Take 1 tablet (1 mg total) by mouth 3 (three) times daily before meals. 270 tablet 3   rosuvastatin  (CRESTOR ) 10 MG tablet Take 1 tablet (10 mg total) by mouth daily. 90 tablet 3   tadalafil  (CIALIS ) 5 MG tablet Take 1 tablet (5 mg total) by mouth daily. 30 tablet 11   tamsulosin  (FLOMAX ) 0.4 MG CAPS capsule Take 1 capsule (0.4 mg total) by mouth daily. 90 capsule 3   thiamine (VITAMIN B-1) 100 MG tablet Take 100 mg by mouth daily.     trandolapril  (MAVIK ) 4 MG tablet Take 1 tablet (4 mg total) by mouth daily. 90 tablet 1   triamcinolone  ointment (KENALOG ) 0.1 % Apply 1 Application topically  2 (two) times daily as needed (rash flare). Do not use on the face, neck, armpits or groin area. Do not use more than 3 weeks in a row. 80 g 1   No current facility-administered medications for this visit.   Allergies: Allergies  Allergen Reactions   Atorvastatin Other (See Comments)    Muscle/joint aches   Prednisolone Swelling    Red swollen face He had cortisone knee shot and was ok   Sulfonamide Derivatives Nausea Only   Trulicity  [Dulaglutide ]     ?pancreatitis   I reviewed his past medical history, social history, family history, and environmental history and no significant changes have been reported from his previous visit.  Review of Systems  Constitutional:  Negative for appetite change, chills, fever and unexpected weight change.  HENT:  Negative for congestion and rhinorrhea.   Eyes:  Negative for itching.  Respiratory:  Negative for cough, chest tightness, shortness of breath and wheezing.   Cardiovascular:  Negative for chest pain.  Gastrointestinal:  Negative for abdominal pain.  Genitourinary:  Negative for difficulty urinating.  Skin:  Negative for  rash.       pruritus  Neurological:  Negative for headaches.    Objective: BP 114/70 (BP Location: Right Arm, Patient Position: Sitting, Cuff Size: Normal)   Pulse 85   Temp 98.8 F (37.1 C) (Temporal)   Resp 16   Ht 5' 8 (1.727 m)   Wt 180 lb (81.6 kg)   SpO2 96%   BMI 27.37 kg/m  Body mass index is 27.37 kg/m. Physical Exam Vitals and nursing note reviewed.  Constitutional:      Appearance: Normal appearance. He is well-developed.  HENT:     Head: Normocephalic and atraumatic.     Right Ear: Tympanic membrane and external ear normal.     Left Ear: Tympanic membrane and external ear normal.     Nose: Nose normal.     Mouth/Throat:     Mouth: Mucous membranes are moist.     Pharynx: Oropharynx is clear.  Eyes:     Conjunctiva/sclera: Conjunctivae normal.  Cardiovascular:     Rate and Rhythm: Normal rate and regular rhythm.     Heart sounds: Normal heart sounds. No murmur heard.    No friction rub. No gallop.  Pulmonary:     Effort: Pulmonary effort is normal.     Breath sounds: Normal breath sounds. No wheezing, rhonchi or rales.  Musculoskeletal:     Cervical back: Neck supple.  Skin:    General: Skin is warm.     Findings: No rash.     Comments: Very frail skin with some dryness and areas of ecchymosis on upper extremities b/l.  Neurological:     Mental Status: He is alert and oriented to person, place, and time.  Psychiatric:        Behavior: Behavior normal.    Previous notes and tests were reviewed. The plan was reviewed with the patient/family, and all questions/concerned were addressed.  It was my pleasure to see Douglas Richards today and participate in his care. Please feel free to contact me with any questions or concerns.  Sincerely,  Orlan Cramp, DO Allergy & Immunology  [facility]  [place] office: [phone] [facility] office: [phone]

## 2023-12-27 NOTE — Patient Instructions (Addendum)
 Rash Keep track of rashes and take pictures. Continue proper skin care.  Stop levocetirizine, famotidine  and hydroxyzine .   Avoid the following potential triggers: alcohol, tight clothing, NSAIDs, hot showers and getting overheated. Use triamcinolone  0.1% ointment twice a day as needed for rash flares. Do not use on the face, neck, armpits or groin area. Do not use more than 3 weeks in a row.  May take gabapentin 100mg  1 tablet at night as needed for itching.   Please follow up with your kidney doctor -  regarding kidneys and ask them about colonoscopy prep.  Follow up as needed.    Skin care recommendations  Bath time: Always use lukewarm water. AVOID very hot or cold water. Keep bathing time to 5-10 minutes. Do NOT use bubble bath. Use a mild soap and use just enough to wash the dirty areas. Do NOT scrub skin vigorously.  After bathing, pat dry your skin with a towel. Do NOT rub or scrub the skin.  Moisturizers and prescriptions:  ALWAYS apply moisturizers immediately after bathing (within 3 minutes). This helps to lock-in moisture. Use the moisturizer several times a day over the whole body. Good summer moisturizers include: Aveeno, CeraVe, Cetaphil. Good winter moisturizers include: Aquaphor, Vaseline, Cerave, Cetaphil, Eucerin, Vanicream. When using moisturizers along with medications, the moisturizer should be applied about one hour after applying the medication to prevent diluting effect of the medication or moisturize around where you applied the medications. When not using medications, the moisturizer can be continued twice daily as maintenance.  Laundry and clothing: Avoid laundry products with added color or perfumes. Use unscented hypo-allergenic laundry products such as Tide free, Cheer free & gentle, and All free and clear.  If the skin still seems dry or sensitive, you can try double-rinsing the clothes. Avoid tight or scratchy clothing such as wool. Do not use  fabric softeners or dyer sheets.

## 2023-12-28 ENCOUNTER — Other Ambulatory Visit (HOSPITAL_COMMUNITY): Payer: Self-pay

## 2023-12-30 ENCOUNTER — Other Ambulatory Visit: Payer: Self-pay | Admitting: Internal Medicine

## 2023-12-30 MED ORDER — TRANDOLAPRIL 4 MG PO TABS
4.0000 mg | ORAL_TABLET | Freq: Every day | ORAL | 1 refills | Status: AC
Start: 2023-12-30 — End: ?
  Filled 2023-12-30: qty 90, 90d supply, fill #0

## 2023-12-30 MED ORDER — AMLODIPINE BESYLATE 5 MG PO TABS
5.0000 mg | ORAL_TABLET | Freq: Every day | ORAL | 1 refills | Status: AC
Start: 2023-12-30 — End: ?
  Filled 2023-12-30: qty 90, 90d supply, fill #0

## 2023-12-31 ENCOUNTER — Other Ambulatory Visit: Payer: Self-pay

## 2023-12-31 ENCOUNTER — Other Ambulatory Visit (HOSPITAL_COMMUNITY): Payer: Self-pay

## 2024-01-02 ENCOUNTER — Other Ambulatory Visit (HOSPITAL_COMMUNITY): Payer: Self-pay

## 2024-01-02 ENCOUNTER — Other Ambulatory Visit: Payer: Self-pay | Admitting: Internal Medicine

## 2024-01-02 ENCOUNTER — Other Ambulatory Visit: Payer: Self-pay

## 2024-01-02 MED ORDER — REPAGLINIDE 1 MG PO TABS
1.0000 mg | ORAL_TABLET | Freq: Three times a day (TID) | ORAL | 3 refills | Status: AC
  Filled 2024-01-02: qty 270, 90d supply, fill #0

## 2024-01-06 ENCOUNTER — Other Ambulatory Visit (HOSPITAL_COMMUNITY): Payer: Self-pay

## 2024-01-10 ENCOUNTER — Encounter: Admitting: Internal Medicine

## 2024-01-13 ENCOUNTER — Other Ambulatory Visit (HOSPITAL_COMMUNITY): Payer: Self-pay

## 2024-01-15 ENCOUNTER — Ambulatory Visit: Admitting: Allergy

## 2024-01-16 ENCOUNTER — Other Ambulatory Visit (INDEPENDENT_AMBULATORY_CARE_PROVIDER_SITE_OTHER)

## 2024-01-16 DIAGNOSIS — N400 Enlarged prostate without lower urinary tract symptoms: Secondary | ICD-10-CM | POA: Diagnosis not present

## 2024-01-16 DIAGNOSIS — E1122 Type 2 diabetes mellitus with diabetic chronic kidney disease: Secondary | ICD-10-CM | POA: Diagnosis not present

## 2024-01-16 DIAGNOSIS — N2889 Other specified disorders of kidney and ureter: Secondary | ICD-10-CM | POA: Diagnosis not present

## 2024-01-16 DIAGNOSIS — N182 Chronic kidney disease, stage 2 (mild): Secondary | ICD-10-CM | POA: Diagnosis not present

## 2024-01-16 LAB — COMPREHENSIVE METABOLIC PANEL WITH GFR
ALT: 19 U/L (ref 0–53)
AST: 19 U/L (ref 0–37)
Albumin: 4.3 g/dL (ref 3.5–5.2)
Alkaline Phosphatase: 42 U/L (ref 39–117)
BUN: 28 mg/dL — ABNORMAL HIGH (ref 6–23)
CO2: 25 meq/L (ref 19–32)
Calcium: 9.2 mg/dL (ref 8.4–10.5)
Chloride: 104 meq/L (ref 96–112)
Creatinine, Ser: 1.85 mg/dL — ABNORMAL HIGH (ref 0.40–1.50)
GFR: 34.51 mL/min — ABNORMAL LOW (ref >60.00)
Glucose, Bld: 97 mg/dL (ref 70–99)
Potassium: 4.7 meq/L (ref 3.5–5.1)
Sodium: 137 meq/L (ref 135–145)
Total Bilirubin: 0.4 mg/dL (ref 0.2–1.2)
Total Protein: 6.5 g/dL (ref 6.0–8.3)

## 2024-01-16 LAB — HEMOGLOBIN A1C: Hgb A1c MFr Bld: 7.3 % — ABNORMAL HIGH (ref 4.6–6.5)

## 2024-01-16 LAB — PSA: PSA: 0.94 ng/mL (ref 0.10–4.00)

## 2024-01-18 ENCOUNTER — Ambulatory Visit: Payer: Self-pay | Admitting: Internal Medicine

## 2024-01-22 ENCOUNTER — Other Ambulatory Visit (HOSPITAL_COMMUNITY): Payer: Self-pay

## 2024-01-23 ENCOUNTER — Encounter: Payer: Self-pay | Admitting: Internal Medicine

## 2024-01-23 ENCOUNTER — Ambulatory Visit: Admitting: Internal Medicine

## 2024-01-23 ENCOUNTER — Other Ambulatory Visit (HOSPITAL_COMMUNITY): Payer: Self-pay

## 2024-01-23 DIAGNOSIS — E1122 Type 2 diabetes mellitus with diabetic chronic kidney disease: Secondary | ICD-10-CM

## 2024-01-23 DIAGNOSIS — M545 Low back pain, unspecified: Secondary | ICD-10-CM | POA: Diagnosis not present

## 2024-01-23 DIAGNOSIS — G8929 Other chronic pain: Secondary | ICD-10-CM

## 2024-01-23 DIAGNOSIS — L299 Pruritus, unspecified: Secondary | ICD-10-CM

## 2024-01-23 DIAGNOSIS — I10 Essential (primary) hypertension: Secondary | ICD-10-CM | POA: Diagnosis not present

## 2024-01-23 DIAGNOSIS — N182 Chronic kidney disease, stage 2 (mild): Secondary | ICD-10-CM | POA: Diagnosis not present

## 2024-01-23 MED ORDER — GABAPENTIN 100 MG PO CAPS
100.0000 mg | ORAL_CAPSULE | Freq: Three times a day (TID) | ORAL | 5 refills | Status: AC | PRN
  Filled 2024-01-23 – 2024-02-23 (×3): qty 90, 30d supply, fill #0

## 2024-01-23 MED ORDER — TADALAFIL 5 MG PO TABS
5.0000 mg | ORAL_TABLET | Freq: Every day | ORAL | 3 refills | Status: AC
  Filled 2024-01-23: qty 90, 90d supply, fill #0

## 2024-01-23 NOTE — Assessment & Plan Note (Signed)
 SBP is 120-130 at home Cont on Amlodipine , Mavik 

## 2024-01-23 NOTE — Assessment & Plan Note (Signed)
 A little better Improve diet

## 2024-01-23 NOTE — Progress Notes (Signed)
 Subjective:  Patient ID: Douglas Richards, male    DOB: 30-Nov-1945  Age: 78 y.o. MRN: [id number]  CC: Follow-up (3 months)   HPI Eliud Polo Kooi presents for itching and pruritus and no rash Gabapentin  stopped the itching!  Outpatient Medications Prior to Visit  Medication Sig Dispense Refill   allopurinol  (ZYLOPRIM ) 100 MG tablet Take 1/2 tablet (50 mg total) by mouth daily. 90 tablet 1   amLODipine  (NORVASC ) 5 MG tablet Take 1 tablet (5 mg total) by mouth daily. 90 tablet 1   aspirin  EC 325 MG tablet Take 650 mg by mouth every 6 (six) hours as needed (pain.).     Cholecalciferol (VITAMIN D3) 1000 UNITS tablet Take 1,000 Units by mouth every evening.     clotrimazole -betamethasone  (LOTRISONE ) cream Apply 1 Application topically daily. 30 g 1   famotidine  (PEPCID ) 20 MG tablet Take 1 tablet (20 mg total) by mouth 2 (two) times daily. 60 tablet 1   glucose blood (FREESTYLE LITE) test strip USE TO CHECK BLOOD SUGAR TWICE DAILY 100 strip 5   glucose blood test strip Use as instructed twice daily 100 each 11   Lancets (ONETOUCH DELICA PLUS LANCET33G) MISC Use as directed to check blood sugar twice daily. 100 each 12   metFORMIN  (GLUCOPHAGE ) 500 MG tablet Take 1 tablet (500 mg total) by mouth 2 (two) times daily with a meal. 180 tablet 3   MINERAL OIL PO Take 15 mLs by mouth in the morning.     omeprazole  (PRILOSEC) 40 MG capsule Take 1 capsule (40 mg total) by mouth daily. 90 capsule 3   ondansetron  (ZOFRAN -ODT) 4 MG disintegrating tablet Take 1 tablet (4 mg total) by mouth every 8 (eight) hours as needed for nausea or vomiting 30 tablet 1   Polyethyl Glycol-Propyl Glycol (LUBRICANT EYE DROPS) 0.4-0.3 % SOLN Place 1-2 drops into both eyes See admin instructions. Instill 1 drop into both eyes scheduled every morning & every 2 hours as needed for dry/irritated eyes.     repaglinide  (PRANDIN ) 1 MG tablet Take 1 tablet (1 mg total) by mouth 3 (three) times daily before meals. 270 tablet 3    rosuvastatin  (CRESTOR ) 10 MG tablet Take 1 tablet (10 mg total) by mouth daily. 90 tablet 3   tamsulosin  (FLOMAX ) 0.4 MG CAPS capsule Take 1 capsule (0.4 mg total) by mouth daily. 90 capsule 3   thiamine (VITAMIN B-1) 100 MG tablet Take 100 mg by mouth daily.     trandolapril  (MAVIK ) 4 MG tablet Take 1 tablet (4 mg total) by mouth daily. 90 tablet 1   triamcinolone  ointment (KENALOG ) 0.1 % Apply 1 Application topically 2 (two) times daily as needed (rash flare). Do not use on the face, neck, armpits or groin area. Do not use more than 3 weeks in a row. 80 g 1   gabapentin  (NEURONTIN ) 100 MG capsule Take 1 capsule (100 mg total) by mouth at bedtime as needed (itching). 30 capsule 1   tadalafil  (CIALIS ) 5 MG tablet Take 1 tablet (5 mg total) by mouth daily. 30 tablet 11   Blood Glucose Monitoring Suppl (ACCU-CHEK GUIDE) w/Device KIT Use to check blood sugar twice daily. 1 kit 1   docusate sodium (COLACE) 100 MG capsule Take 100 mg by mouth 2 (two) times daily.     levocetirizine (XYZAL ) 5 MG tablet Take 1 tablet (5 mg total) by mouth every evening. 30 tablet 5   methocarbamol  (ROBAXIN ) 500 MG tablet Take 1 tablet (500 mg  total) by mouth every 8 (eight) hours as needed. 30 tablet 1   No facility-administered medications prior to visit.    ROS: Review of Systems  Objective:  There were no vitals taken for this visit.  BP Readings from Last 3 Encounters:  12/27/23 114/70  12/13/23 118/74  11/22/23 136/78    Wt Readings from Last 3 Encounters:  12/27/23 180 lb (81.6 kg)  12/13/23 176 lb (79.8 kg)  11/22/23 178 lb (80.7 kg)    Physical Exam  Lab Results  Component Value Date   WBC 8.3 12/14/2023   HGB 12.8 (L) 12/14/2023   HCT 39.1 12/14/2023   PLT 170 12/14/2023   GLUCOSE 97 01/16/2024   CHOL 127 04/11/2023   TRIG 98.0 04/11/2023   HDL 61.70 04/11/2023   LDLDIRECT 105.5 09/10/2013   LDLCALC 46 04/11/2023   ALT 19 01/16/2024   AST 19 01/16/2024   NA 137 01/16/2024   K 4.7  01/16/2024   CL 104 01/16/2024   CREATININE 1.85 (H) 01/16/2024   BUN 28 (H) 01/16/2024   CO2 25 01/16/2024   TSH 3.100 12/14/2023   PSA 0.94 01/16/2024   HGBA1C 7.3 (H) 01/16/2024    No results found.  Assessment & Plan:   Problem List Items Addressed This Visit     DM2 (diabetes mellitus, type 2) (HCC)   A little better Improve diet      Essential hypertension   SBP is 120-130 at home Cont on Amlodipine , Mavik       Relevant Medications   tadalafil  (CIALIS ) 5 MG tablet   Low back pain    Norco prn  Potential benefits of a long term opioids use as well as potential risks (i.e. addiction risk, apnea etc) and complications (i.e. Somnolence, constipation and others) were explained to the patient and were aknowledged.       Pruritus - Primary   Gabapentin  helped a lot! Use Benadryl 25-50 mg qid prn         Meds ordered this encounter  Medications   gabapentin  (NEURONTIN ) 100 MG capsule    Sig: Take 1 capsule (100 mg total) by mouth 3 (three) times daily as needed (itching).    Dispense:  90 capsule    Refill:  5   tadalafil  (CIALIS ) 5 MG tablet    Sig: Take 1 tablet (5 mg total) by mouth daily.    Dispense:  90 tablet    Refill:  3      Follow-up: Return in about 3 months (around 04/22/2024) for a follow-up visit.  Marolyn Noel, MD

## 2024-01-23 NOTE — Assessment & Plan Note (Signed)
Norco prn  Potential benefits of a long term opioids use as well as potential risks (i.e. addiction risk, apnea etc) and complications (i.e. Somnolence, constipation and others) were explained to the patient and were aknowledged. 

## 2024-01-23 NOTE — Assessment & Plan Note (Signed)
 Gabapentin  helped a lot! Use Benadryl 25-50 mg qid prn

## 2024-01-24 ENCOUNTER — Other Ambulatory Visit: Payer: Self-pay

## 2024-02-03 ENCOUNTER — Other Ambulatory Visit (HOSPITAL_COMMUNITY): Payer: Self-pay

## 2024-02-03 ENCOUNTER — Other Ambulatory Visit: Payer: Self-pay | Admitting: Internal Medicine

## 2024-02-04 ENCOUNTER — Other Ambulatory Visit (HOSPITAL_COMMUNITY): Payer: Self-pay

## 2024-02-04 MED ORDER — TAMSULOSIN HCL 0.4 MG PO CAPS
0.4000 mg | ORAL_CAPSULE | Freq: Every day | ORAL | 3 refills | Status: AC
  Filled 2024-02-04: qty 90, 90d supply, fill #0
  Filled 2024-02-10: qty 90, 90d supply, fill #1

## 2024-02-10 ENCOUNTER — Other Ambulatory Visit: Payer: Self-pay | Admitting: Family Medicine

## 2024-02-10 DIAGNOSIS — L509 Urticaria, unspecified: Secondary | ICD-10-CM

## 2024-02-11 ENCOUNTER — Other Ambulatory Visit (HOSPITAL_COMMUNITY): Payer: Self-pay

## 2024-02-11 ENCOUNTER — Other Ambulatory Visit: Payer: Self-pay

## 2024-02-11 MED ORDER — FAMOTIDINE 20 MG PO TABS
20.0000 mg | ORAL_TABLET | Freq: Two times a day (BID) | ORAL | 1 refills | Status: AC
  Filled 2024-02-11: qty 60, 30d supply, fill #0

## 2024-02-13 ENCOUNTER — Other Ambulatory Visit: Payer: Self-pay

## 2024-02-23 ENCOUNTER — Other Ambulatory Visit (HOSPITAL_COMMUNITY): Payer: Self-pay

## 2024-02-25 ENCOUNTER — Other Ambulatory Visit (HOSPITAL_COMMUNITY): Payer: Self-pay

## 2024-02-26 ENCOUNTER — Encounter: Payer: Self-pay | Admitting: Internal Medicine

## 2024-02-28 ENCOUNTER — Other Ambulatory Visit (HOSPITAL_COMMUNITY): Payer: Self-pay

## 2024-03-04 ENCOUNTER — Encounter: Admitting: Internal Medicine

## 2024-03-26 ENCOUNTER — Ambulatory Visit: Payer: PPO

## 2024-04-03 ENCOUNTER — Encounter: Admitting: Internal Medicine

## 2024-04-14 ENCOUNTER — Encounter: Admitting: Internal Medicine

## 2024-04-22 ENCOUNTER — Ambulatory Visit: Admitting: Internal Medicine
